# Patient Record
Sex: Male | Born: 1970
Health system: Southern US, Community
[De-identification: ages and names within clinical notes are randomized; demographics above are authoritative.]

## PROBLEM LIST (undated history)

## (undated) DIAGNOSIS — Q2112 Patent foramen ovale: Secondary | ICD-10-CM

## (undated) DIAGNOSIS — I8290 Acute embolism and thrombosis of unspecified vein: Secondary | ICD-10-CM

## (undated) DIAGNOSIS — I82409 Acute embolism and thrombosis of unspecified deep veins of unspecified lower extremity: Secondary | ICD-10-CM

## (undated) DIAGNOSIS — L539 Erythematous condition, unspecified: Secondary | ICD-10-CM

## (undated) DIAGNOSIS — R55 Syncope and collapse: Secondary | ICD-10-CM

## (undated) DIAGNOSIS — Q211 Atrial septal defect: Secondary | ICD-10-CM

## (undated) DIAGNOSIS — B000 Eczema herpeticum: Secondary | ICD-10-CM

## (undated) DIAGNOSIS — I10 Essential (primary) hypertension: Secondary | ICD-10-CM

## (undated) DIAGNOSIS — N184 Chronic kidney disease, stage 4 (severe): Secondary | ICD-10-CM

## (undated) DIAGNOSIS — I2699 Other pulmonary embolism without acute cor pulmonale: Secondary | ICD-10-CM

## (undated) DIAGNOSIS — Z973 Presence of spectacles and contact lenses: Secondary | ICD-10-CM

## (undated) DIAGNOSIS — Q613 Polycystic kidney, unspecified: Secondary | ICD-10-CM

## (undated) DIAGNOSIS — I480 Paroxysmal atrial fibrillation: Secondary | ICD-10-CM

## (undated) HISTORY — DX: Paroxysmal atrial fibrillation: I48.0

## (undated) HISTORY — PX: HERNIA REPAIR: SHX51

## (undated) HISTORY — DX: Acute embolism and thrombosis of unspecified deep veins of unspecified lower extremity: I82.409

## (undated) HISTORY — DX: Presence of spectacles and contact lenses: Z97.3

## (undated) HISTORY — DX: Syncope and collapse: R55

## (undated) HISTORY — DX: Essential (primary) hypertension: I10

## (undated) HISTORY — DX: Patent foramen ovale: Q21.12

## (undated) HISTORY — DX: Other pulmonary embolism without acute cor pulmonale: I26.99

## (undated) HISTORY — DX: Eczema herpeticum: B00.0

## (undated) HISTORY — DX: Erythematous condition, unspecified: L53.9

## (undated) HISTORY — DX: Polycystic kidney, unspecified: Q61.3

## (undated) HISTORY — DX: Chronic kidney disease, stage 4 (severe): N18.4

## (undated) HISTORY — DX: Acute embolism and thrombosis of unspecified vein: I82.90

## (undated) HISTORY — DX: Atrial septal defect: Q21.1

---
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
## 2000-05-29 ENCOUNTER — Ambulatory Visit: Admission: RE | Admit: 2000-05-29 | Discharge: 2000-05-29 | Payer: Self-pay | Admitting: Family Medicine

## 2000-07-04 ENCOUNTER — Encounter: Payer: Self-pay | Admitting: Family Medicine

## 2000-07-04 ENCOUNTER — Encounter: Admission: RE | Admit: 2000-07-04 | Discharge: 2000-07-04 | Payer: Self-pay | Admitting: Family Medicine

## 2004-11-17 ENCOUNTER — Inpatient Hospital Stay (HOSPITAL_COMMUNITY): Admission: EM | Admit: 2004-11-17 | Discharge: 2004-11-18 | Payer: Self-pay | Admitting: Emergency Medicine

## 2004-11-17 ENCOUNTER — Ambulatory Visit: Payer: Self-pay | Admitting: Internal Medicine

## 2007-07-15 ENCOUNTER — Encounter (INDEPENDENT_AMBULATORY_CARE_PROVIDER_SITE_OTHER): Payer: Self-pay | Admitting: Family Medicine

## 2010-08-06 ENCOUNTER — Emergency Department (HOSPITAL_COMMUNITY): Admission: EM | Admit: 2010-08-06 | Discharge: 2010-08-07 | Payer: Self-pay | Admitting: Emergency Medicine

## 2011-01-14 ENCOUNTER — Encounter (HOSPITAL_COMMUNITY): Payer: BC Managed Care – PPO

## 2011-01-14 ENCOUNTER — Other Ambulatory Visit: Payer: Self-pay | Admitting: General Surgery

## 2011-01-14 ENCOUNTER — Other Ambulatory Visit (HOSPITAL_COMMUNITY): Payer: Self-pay | Admitting: General Surgery

## 2011-01-14 ENCOUNTER — Ambulatory Visit (HOSPITAL_COMMUNITY)
Admission: RE | Admit: 2011-01-14 | Discharge: 2011-01-14 | Disposition: A | Discharge status: Home / Self Care | Payer: BC Managed Care – PPO | Source: Ambulatory Visit | Attending: General Surgery | Admitting: General Surgery

## 2011-01-14 DIAGNOSIS — Z01818 Encounter for other preprocedural examination: Secondary | ICD-10-CM

## 2011-01-14 DIAGNOSIS — I10 Essential (primary) hypertension: Secondary | ICD-10-CM | POA: Insufficient documentation

## 2011-01-14 DIAGNOSIS — Z01812 Encounter for preprocedural laboratory examination: Secondary | ICD-10-CM | POA: Insufficient documentation

## 2011-01-14 DIAGNOSIS — K4090 Unilateral inguinal hernia, without obstruction or gangrene, not specified as recurrent: Secondary | ICD-10-CM | POA: Insufficient documentation

## 2011-01-14 DIAGNOSIS — Z01810 Encounter for preprocedural cardiovascular examination: Secondary | ICD-10-CM | POA: Insufficient documentation

## 2011-01-14 DIAGNOSIS — F17200 Nicotine dependence, unspecified, uncomplicated: Secondary | ICD-10-CM | POA: Insufficient documentation

## 2011-01-14 LAB — CBC
HCT: 43.6 % (ref 39.0–52.0)
Hemoglobin: 14.7 g/dL (ref 13.0–17.0)
MCH: 31.6 pg (ref 26.0–34.0)
MCHC: 33.7 g/dL (ref 30.0–36.0)
MCV: 93.8 fL (ref 78.0–100.0)
Platelets: 190 10*3/uL (ref 150–400)
RBC: 4.65 MIL/uL (ref 4.22–5.81)
RDW: 12.6 % (ref 11.5–15.5)
WBC: 4.1 10*3/uL (ref 4.0–10.5)

## 2011-01-14 LAB — DIFFERENTIAL
Basophils Absolute: 0 10*3/uL (ref 0.0–0.1)
Basophils Relative: 1 % (ref 0–1)
Eosinophils Absolute: 0.3 10*3/uL (ref 0.0–0.7)
Eosinophils Relative: 8 % — ABNORMAL HIGH (ref 0–5)
Lymphocytes Relative: 31 % (ref 12–46)
Lymphs Abs: 1.3 10*3/uL (ref 0.7–4.0)
Monocytes Absolute: 0.3 10*3/uL (ref 0.1–1.0)
Monocytes Relative: 7 % (ref 3–12)
Neutro Abs: 2.2 10*3/uL (ref 1.7–7.7)
Neutrophils Relative %: 54 % (ref 43–77)

## 2011-01-14 LAB — BASIC METABOLIC PANEL
BUN: 16 mg/dL (ref 6–23)
CO2: 29 mEq/L (ref 19–32)
Calcium: 9.3 mg/dL (ref 8.4–10.5)
Chloride: 106 mEq/L (ref 96–112)
Creatinine, Ser: 1.34 mg/dL (ref 0.4–1.5)
GFR calc Af Amer: >60 mL/min (ref >60)
GFR calc non Af Amer: 60 mL/min — ABNORMAL LOW (ref >60)
Glucose, Bld: 106 mg/dL — ABNORMAL HIGH (ref 70–99)
Potassium: 3.9 mEq/L (ref 3.5–5.1)
Sodium: 141 mEq/L (ref 135–145)

## 2011-01-14 LAB — SURGICAL PCR SCREEN: Staphylococcus aureus: POSITIVE — AB

## 2011-01-16 ENCOUNTER — Ambulatory Visit (HOSPITAL_COMMUNITY)
Admission: RE | Admit: 2011-01-16 | Discharge: 2011-01-16 | Disposition: A | Discharge status: Home / Self Care | Payer: BC Managed Care – PPO | Source: Ambulatory Visit | Attending: General Surgery | Admitting: General Surgery

## 2011-01-16 DIAGNOSIS — Z79899 Other long term (current) drug therapy: Secondary | ICD-10-CM | POA: Insufficient documentation

## 2011-01-16 DIAGNOSIS — J45909 Unspecified asthma, uncomplicated: Secondary | ICD-10-CM | POA: Insufficient documentation

## 2011-01-16 DIAGNOSIS — I10 Essential (primary) hypertension: Secondary | ICD-10-CM | POA: Insufficient documentation

## 2011-01-16 DIAGNOSIS — F17200 Nicotine dependence, unspecified, uncomplicated: Secondary | ICD-10-CM | POA: Insufficient documentation

## 2011-01-16 DIAGNOSIS — K4090 Unilateral inguinal hernia, without obstruction or gangrene, not specified as recurrent: Secondary | ICD-10-CM | POA: Insufficient documentation

## 2011-01-17 NOTE — Op Note (Signed)
NAMEBRAYTON, [name]             ACCOUNT NO.:  [id number]  MEDICAL RECORD NO.:  [id number]           PATIENT TYPE:  O  LOCATION:  DAYL                         FACILITY:  [facility]  PHYSICIAN:  Dickey Gave, MDDATE OF BIRTH:  [birth year]  DATE OF PROCEDURE:  01/16/2011 DATE OF DISCHARGE:                              OPERATIVE REPORT   PREOPERATIVE DIAGNOSIS:  Right inguinal hernia  POSTOPERATIVE DIAGNOSIS:  Direct right inguinal hernia.  PROCEDURE:  Right inguinal repair with Ultrapro hernia system.  SURGEON:  Dickey Gave, MD  ASSISTANT:  None.  ANESTHESIA:  General.  SUPERVISING ANESTHESIOLOGIST:  Cleon Dew. Kalman Shan, M.D.  SPECIMENS:  None.  DRAINS:  None.  ESTIMATED BLOOD LOSS:  Minimal.  COMPLICATIONS:  None.  DISPOSITION:  To recovery room in stable condition.  INDICATIONS:  This is a 14-year male who has a right groin bulge that is asymptomatic except for a bulge that he notes.  We discussed observation versus repair.  He wanted to have this area repaired.  On his exam, it was reducible.  It was somewhat tender when I reduced it.  So we discussed an open right inguinal repair with mesh.  DESCRIPTION OF PROCEDURE:  After informed consent was obtained, the patient was taken to the operating room, he was administered 1 g of intravenous cefazolin.  Sequential compression devices were placed on lower extremities prior to induction of anesthesia.  He was then placed under general anesthesia with an LMA without complication.  His right groin was then prepped and draped in standard sterile surgical fashion. A surgical time-out was performed.  I infiltrated 10 cc of 0.25% Marcaine throughout his right groin as well as performing an ilioinguinal nerve block.  I then made a 4-cm incision in his right groin, carried this down to the level of the external abdominal oblique.  This was incised.  His spermatic cord was encircled with a Penrose drain.  He did not  have an indirect hernia.  He had a fairly large direct hernia present.  His cord structures were separated free from the direct hernia.  The direct hernia sac was entered and the preperitoneal space was developed.  A sponge was placed in this.  I again examined the cord and there was no evidence of an indirect hernia at all.  I then placed a large Ultrapro hernia system with the bottom portion of bilayer and the preperitoneal space.  This was placed over Cooper's ligament and laid flat.  I then closed the floor with the 2-0 Vicryl.  The top portion was then laid flat.  This was sutured into several positions with 2-0 Prolene to the pubic tubercle and to the inguinal ligament inferiorly.  A T cut was made.  This was wrapped around the spermatic cord.  This was sutured together as well as to the internal oblique superiorly as well.  The ends were laid flat under the external oblique.  Hemostasis was observed.  The external oblique was closed with 2-0 Vicryl, Scarpa's with 3-0 Vicryl, the skin with 4-0 Monocryl in subcuticular fashion.  Dermabond was placed over the wound. An additional 10 cc of  0.25% Marcaine were filtrated throughout the wound again.  He tolerated this well and transferred to the recovery room in stable condition.     Dickey Gave, MD     MCW/MEDQ  D:  01/16/2011  T:  01/16/2011  Job:  [id number]  Electronically Signed by Rolm Bookbinder MD on 01/17/2011 07:18:29 AM

## 2011-02-20 ENCOUNTER — Encounter (INDEPENDENT_AMBULATORY_CARE_PROVIDER_SITE_OTHER): Payer: Self-pay | Admitting: Surgery

## 2011-03-08 NOTE — H&P (Signed)
[name], [name]             ACCOUNT NO.:  [id number]   MEDICAL RECORD NO.:  [id number]          PATIENT TYPE:  INP   LOCATION:  2103                         FACILITY:  [facility]   PHYSICIAN:  Sherryl Manges, M.D.  DATE OF BIRTH:  Oct 17, 1971   DATE OF ADMISSION:  11/17/2004  DATE OF DISCHARGE:                                HISTORY & PHYSICAL   PRIMARY CARE PHYSICIAN:  Dr. Juventino Slovak   CHIEF COMPLAINT:  Shortness of breath.   HISTORY OF PRESENT ILLNESS:  This is a 40 year old male, known asthmatic,  apparently became initially short of breath on Christmas Eve, i.e., October 13, 2004 and since then has been intermittently wheezing and short of  breath, becoming progressively worse since. He has had a cough productive of  clear phlegm over the past 1-week. However, on November 16, 2004 his  shortness of breath and wheezing became so bad that he went to the [facility] where he had nebulizer treatment x2 followed by IV steroids and  was sent to the emergency department at [facility] via EMS.   PAST MEDICAL HISTORY:  1.  Bronchial asthma.  2.  Bilateral inguinal hernias.  3.  Smoker.   MEDICATIONS:  1.  Proventil MDI p.r.n.  2.  Advair Diskus b.i.d.   ALLERGIES:  No known drug allergies.   SOCIAL/FAMILY HISTORY:  Married. Smokes one pack of cigarettes per day for  since age [age]years. Drinks alcohol occasionally.  No history of drug  abuse.  The patient has a strong family history of bronchial asthma.   PHYSICAL EXAMINATION:  GENERAL:  In obvious respiratory distress.  VITAL SIGNS: Temperature 97.0, pulse 90 per minute, regular. Blood pressure  210/110 mmHg.  Pulse oximetry 85% on room air. The patient is unable to talk  in complete sentences.  Accessory respiratory muscles are in play.  HEENT:  No pallor or jaundice.  NECK:  Supple, JVP not seen. No palpable goiter.  CHEST:  Bilateral expiratory polyphonic wheeze, no crackles.  HEART:  S1, S2 heard, normal,  regular. No murmurs.  ABDOMEN:  Unable to examine as patient is unable to lie flat.  LOWER EXTREMITIES:  Showed no edema.   LABORATORY DATA:  CBC, BMP are pending. Chest x-ray reveals no focal  infiltrate.   ASSESSMENT/AND PLAN:  1.  Acute severe bronchial asthma/Status asthmaticus. Will continue      nebulizers, oxygen and intravenous steroids. Will admit to the intensive      care unit. ABG done on oxygen shows a pH of 7.8, PCO2 69.3, PO2 of 55      mmHg, bicarbonate 27.6 on 100% O2, features of respiratory failure. Will      maintain low threshold for intubation and artificial ventilation.   1.  Uncontrolled hypertension in face of severe respiratory distress.      Patient has no prior history of hypertension. Will reassess when the      patient has settled down. Meanwhile, will utilize ACE inhibitors as      needed.  Further management will depend on clinical course.   Addendum: Dr Melvyn Novas ,  Pulmonology/Critical Care, was consulted, re possible  initiation of artificial ventilation. Initial recommendations, were to  monitor patient very closely, as patient appeared to be gradually responding  to intensive conservative measures. Bipap to be utilized initially, should  clinical conditon deteriorate.      CO/MEDQ  D:  11/17/2004  T:  11/17/2004  Job:  [id number]   cc:   Vickki Hearing, M.D.  [address] [place], [place] [address]  Fax: [phone]

## 2012-12-22 ENCOUNTER — Encounter (HOSPITAL_COMMUNITY): Payer: Self-pay | Admitting: Emergency Medicine

## 2012-12-22 ENCOUNTER — Emergency Department (HOSPITAL_COMMUNITY)
Admission: EM | Admit: 2012-12-22 | Discharge: 2012-12-22 | Disposition: A | Discharge status: Home / Self Care | Payer: BC Managed Care – PPO | Source: Home / Self Care | Attending: Family Medicine | Admitting: Family Medicine

## 2012-12-22 DIAGNOSIS — T7840XA Allergy, unspecified, initial encounter: Secondary | ICD-10-CM

## 2012-12-22 LAB — POCT RAPID STREP A: Streptococcus, Group A Screen (Direct): NEGATIVE

## 2012-12-22 MED ORDER — HYDROXYZINE HCL 25 MG PO TABS: 25.0000 mg | ORAL_TABLET | Freq: Four times a day (QID) | ORAL | Status: DC

## 2012-12-22 MED ORDER — ALBUTEROL SULFATE HFA 108 (90 BASE) MCG/ACT IN AERS: 1.0000 | INHALATION_SPRAY | Freq: Four times a day (QID) | RESPIRATORY_TRACT | Status: DC | PRN

## 2012-12-22 MED ORDER — METHYLPREDNISOLONE SODIUM SUCC 125 MG IJ SOLR
INTRAMUSCULAR | Status: AC
  Filled 2012-12-22: qty 2

## 2012-12-22 MED ORDER — METHYLPREDNISOLONE SODIUM SUCC 125 MG IJ SOLR
125.0000 mg | Freq: Once | INTRAMUSCULAR | Status: AC
  Administered 2012-12-22: 125 mg via INTRAMUSCULAR

## 2012-12-22 MED ORDER — METHYLPREDNISOLONE 4 MG PO KIT: PACK | ORAL | Status: DC

## 2012-12-22 NOTE — ED Provider Notes (Signed)
History     CSN: [id number]  Arrival date & time 12/22/12  1604   First MD Initiated Contact with Patient 12/22/12 1608      Chief Complaint  Patient presents with  . Sore Throat    since yesterday. hard to swallow    (Consider location/radiation/quality/duration/timing/severity/associated sxs/prior treatment) Patient is a 42 y.o. male presenting with pharyngitis. The history is provided by the patient.  Sore Throat This is a new problem. The current episode started yesterday (similar episode approx 3 yrs ago). The problem occurs constantly. The problem has not changed since onset.The symptoms are aggravated by swallowing.    Past Medical History  Diagnosis Date  . Hypertension   . Asthma   . Chronic kidney disease   . Wears glasses     Past Surgical History  Procedure Laterality Date  . Eye surgery  1976    Family History  Problem Relation Age of Onset  . Asthma Father   . Kidney disease Father     History  Substance Use Topics  . Smoking status: Current Every Day Smoker -- 1.00 packs/day  . Smokeless tobacco: Not on file  . Alcohol Use: Yes     Comment: OCCASIONALLY      Review of Systems  Constitutional: Negative.   HENT: Positive for sore throat and trouble swallowing. Negative for congestion, rhinorrhea, drooling and postnasal drip.   Respiratory: Positive for wheezing.   Cardiovascular: Negative.   Gastrointestinal: Negative.     Allergies  Review of patient's allergies indicates no known allergies.  Home Medications   Current Outpatient Rx  Name  Route  Sig  Dispense  Refill  . albuterol (PROVENTIL HFA;VENTOLIN HFA) 108 (90 BASE) MCG/ACT inhaler   Inhalation   Inhale 1-2 puffs into the lungs every 6 (six) hours as needed for wheezing.   1 Inhaler   1   . Albuterol Sulfate (PROAIR HFA IN)   Inhalation   Inhale into the lungs daily.           . Aliskiren-Hydrochlorothiazide (TEKTURNA HCT PO)   Oral   Take 12.5 mg by mouth daily.            . hydrOXYzine (ATARAX/VISTARIL) 25 MG tablet   Oral   Take 1 tablet (25 mg total) by mouth every 6 (six) hours.   20 tablet   0   . methylPREDNISolone (MEDROL DOSEPAK) 4 MG tablet      follow package directions, start on wed, take until finished.   21 tablet   0     There were no vitals taken for this visit.  Physical Exam  Nursing note and vitals reviewed. Constitutional: He is oriented to person, place, and time. He appears well-developed and well-nourished. No distress.  HENT:  Head: Normocephalic.  Right Ear: External ear normal.  Left Ear: External ear normal.  Nose: Nose normal.  Mouth/Throat: Uvula is midline, oropharynx is clear and moist and mucous membranes are normal. Edematous present. No oropharyngeal exudate, posterior oropharyngeal edema, posterior oropharyngeal erythema or tonsillar abscesses.  Eyes: Conjunctivae are normal. Pupils are equal, round, and reactive to light.  Neck: Normal range of motion. Neck supple.  Pulmonary/Chest: No respiratory distress. He has wheezes. He has no rales.  Lymphadenopathy:    He has no cervical adenopathy.  Neurological: He is alert and oriented to person, place, and time.  Skin: Skin is warm and dry.    ED Course  Procedures (including critical care time)  Labs Reviewed  POCT RAPID STREP A ([facility]RG CARE ONLY)   No results found.   1. Allergic reaction, initial encounter       MDM          Billy Fischer, MD 12/22/12 1744

## 2012-12-22 NOTE — ED Notes (Signed)
Pt c/o sore throat and very hard to swallow, with swelling.  Pt has not taken any otc  Meds. Pt denies any other symptoms.

## 2012-12-25 ENCOUNTER — Encounter (HOSPITAL_COMMUNITY): Payer: Self-pay | Admitting: Nurse Practitioner

## 2012-12-25 ENCOUNTER — Emergency Department (HOSPITAL_COMMUNITY)
Admission: EM | Admit: 2012-12-25 | Discharge: 2012-12-25 | Disposition: A | Discharge status: Home / Self Care | Payer: BC Managed Care – PPO | Attending: Emergency Medicine | Admitting: Emergency Medicine

## 2012-12-25 ENCOUNTER — Encounter (HOSPITAL_COMMUNITY): Payer: Self-pay | Admitting: Emergency Medicine

## 2012-12-25 ENCOUNTER — Emergency Department (HOSPITAL_COMMUNITY): Payer: BC Managed Care – PPO

## 2012-12-25 ENCOUNTER — Emergency Department (INDEPENDENT_AMBULATORY_CARE_PROVIDER_SITE_OTHER)
Admission: EM | Admit: 2012-12-25 | Discharge: 2012-12-25 | Disposition: A | Discharge status: Other Acute Inpatient Hospital | Payer: BC Managed Care – PPO | Source: Home / Self Care

## 2012-12-25 DIAGNOSIS — J36 Peritonsillar abscess: Secondary | ICD-10-CM

## 2012-12-25 DIAGNOSIS — N189 Chronic kidney disease, unspecified: Secondary | ICD-10-CM | POA: Insufficient documentation

## 2012-12-25 DIAGNOSIS — R599 Enlarged lymph nodes, unspecified: Secondary | ICD-10-CM | POA: Insufficient documentation

## 2012-12-25 DIAGNOSIS — I129 Hypertensive chronic kidney disease with stage 1 through stage 4 chronic kidney disease, or unspecified chronic kidney disease: Secondary | ICD-10-CM | POA: Insufficient documentation

## 2012-12-25 DIAGNOSIS — F17200 Nicotine dependence, unspecified, uncomplicated: Secondary | ICD-10-CM | POA: Insufficient documentation

## 2012-12-25 DIAGNOSIS — J45909 Unspecified asthma, uncomplicated: Secondary | ICD-10-CM | POA: Insufficient documentation

## 2012-12-25 DIAGNOSIS — Z79899 Other long term (current) drug therapy: Secondary | ICD-10-CM | POA: Insufficient documentation

## 2012-12-25 DIAGNOSIS — R1310 Dysphagia, unspecified: Secondary | ICD-10-CM | POA: Insufficient documentation

## 2012-12-25 LAB — POCT I-STAT, CHEM 8
BUN: 25 mg/dL — ABNORMAL HIGH (ref 6–23)
Calcium, Ion: 1.21 mmol/L (ref 1.12–1.23)
Chloride: 104 mEq/L (ref 96–112)
Creatinine, Ser: 1.4 mg/dL — ABNORMAL HIGH (ref 0.50–1.35)
Glucose, Bld: 97 mg/dL (ref 70–99)
Potassium: 3.6 mEq/L (ref 3.5–5.1)

## 2012-12-25 MED ORDER — HYDROCODONE-ACETAMINOPHEN 7.5-500 MG/15ML PO SOLN: ORAL | Status: DC

## 2012-12-25 MED ORDER — IOHEXOL 300 MG/ML  SOLN
75.0000 mL | Freq: Once | INTRAMUSCULAR | Status: AC | PRN
  Administered 2012-12-25: 75 mL via INTRAVENOUS

## 2012-12-25 MED ORDER — SODIUM CHLORIDE 0.9 % IV BOLUS (SEPSIS)
1000.0000 mL | Freq: Once | INTRAVENOUS | Status: AC
  Administered 2012-12-25: 1000 mL via INTRAVENOUS

## 2012-12-25 MED ORDER — DEXAMETHASONE SODIUM PHOSPHATE 10 MG/ML IJ SOLN
10.0000 mg | Freq: Once | INTRAMUSCULAR | Status: AC
  Administered 2012-12-25: 10 mg via INTRAVENOUS
  Filled 2012-12-25: qty 1

## 2012-12-25 MED ORDER — OXYCODONE HCL 5 MG/5ML PO SOLN
5.0000 mg | ORAL | Status: AC
  Administered 2012-12-25: 5 mg via ORAL
  Filled 2012-12-25: qty 5

## 2012-12-25 MED ORDER — HYDROCODONE-ACETAMINOPHEN 5-325 MG PO TABS: 2.0000 | ORAL_TABLET | ORAL | Status: DC | PRN

## 2012-12-25 MED ORDER — HYDROMORPHONE HCL PF 1 MG/ML IJ SOLN
1.0000 mg | Freq: Once | INTRAMUSCULAR | Status: AC
  Administered 2012-12-25: 1 mg via INTRAVENOUS
  Filled 2012-12-25: qty 1

## 2012-12-25 MED ORDER — CLINDAMYCIN HCL 300 MG PO CAPS: 300.0000 mg | ORAL_CAPSULE | Freq: Three times a day (TID) | ORAL | Status: DC

## 2012-12-25 MED ORDER — CLINDAMYCIN PHOSPHATE 600 MG/50ML IV SOLN
600.0000 mg | Freq: Once | INTRAVENOUS | Status: AC
  Administered 2012-12-25: 600 mg via INTRAVENOUS
  Filled 2012-12-25: qty 50

## 2012-12-25 NOTE — ED Notes (Signed)
Pt c/o dysphagia since Monday Denies: f/v/n/d Seen on 12/22/12 here at the [facility] for the same reason; given methylprednisolone w/little relief He is alert and oriented w/no signs of acute distress.

## 2012-12-25 NOTE — ED Provider Notes (Signed)
Chief Complaint  Patient presents with  . Dysphagia    History of Present Illness:   [name] is a 42 year old male who has had a one-week history of a severe sore throat and pain on swallowing. He was seen here Monday for the same thing and a rapid strep screen was negative. He was given a Medrol Dosepak, but feels no better. The pain is mostly on the right side in his glands are swollen on the right. He denies any fever or chills. He has had a cough productive of some clear sputum. He takes Provera and lisinopril. He has asthma and high blood pressure. He has no known medication allergies.  Review of Systems:  Other than as noted above, the patient denies any of the following symptoms. Systemic:  No fever, chills, sweats, fatigue, myalgias, headache, or anorexia. Eye:  No redness, pain or drainage. ENT:  No earache, ear congestion, nasal congestion, sneezing, rhinorrhea, sinus pressure, sinus pain, or post nasal drip. Lungs:  No cough, sputum production, wheezing, shortness of breath, or chest pain. GI:  No abdominal pain, nausea, vomiting, or diarrhea. Skin:  No rash or itching.  [name]:  Past medical history, family history, social history, meds, allergies, and nurse's notes were reviewed.  There is no known exposure to strep or mono.  No prior history of step or mono.  The patient denies use of tobacco.  Physical Exam:   Vital signs:  BP 143/94  Pulse 61  Temp(Src) 98.5 F (36.9 C) (Oral)  Resp 14  SpO2 99% General:  Alert, in no distress. Eye:  No conjunctival injection or drainage. Lids were normal. ENT:  TMs and canals were normal, without erythema or inflammation.  Nasal mucosa was clear and uncongested, without drainage.  Mucous membranes were moist.  Exam of pharynx reveals marked erythema and swelling on the right side with the tonsillar pillar being pushed forward and touching the uvula. There was also slight swelling on the left side as well and the uvula was slightly  swollen in addition. There were no other intraoral lesions.  There were no oral ulcerations or lesions. Neck:  Supple, no adenopathy, tenderness or mass. Lungs:  No respiratory distress.  Lungs were clear to auscultation, without wheezes, rales or rhonchi.  Breath sounds were clear and equal bilaterally.  Heart:  Regular rhythm, without gallops, murmers or rubs. Skin:  Clear, warm, and dry, without rash or lesions.  Assessment:  The encounter diagnosis was Tonsillar abscess.  His situation seems to be worsening since he was here Monday and on physical exam her throat has the appearance of the peritonsillar abscess.  Plan:   1.  The following meds were prescribed:   New Prescriptions   No medications on file   2.  The patient was transferred to the emergency department via shuttle in stable condition.   Harden Mo, MD 12/25/12 [phone]

## 2012-12-25 NOTE — ED Provider Notes (Signed)
History    This chart was scribed for non-physician practitioner working with Ezequiel Essex, MD by Joline Maxcy, ED Scribe. This patient was seen in room TR11C/TR11C and the patient's care was started at 1558.   CSN: [id number]  Arrival date & time 12/25/12  1558   First MD Initiated Contact with Patient 12/25/12 1610      Chief Complaint  Patient presents with  . Sore Throat    (Consider location/radiation/quality/duration/timing/severity/associated sxs/prior treatment) The history is provided by the patient. No language interpreter was used.    [name] is a 42 y.o. male who presents to the Emergency Department complaining of a gradual onset, constant, non-radiating, gradually worsening sore throat that is worse on the right side with difficulty swallowing that began 5 days ago. He was seen by UC 4 days ago and given a Medrol Dosepak after having a negative rapid strep screen, but denies any relief. He denies any associated headache, fever, chills, otalgia, chest pain, nausea, or emesis. He has a h/o of asthma, but denies any recent steroid use. He denies any recent sick contacts.   Past Medical History  Diagnosis Date  . Hypertension   . Asthma   . Chronic kidney disease   . Wears glasses     Past Surgical History  Procedure Laterality Date  . Eye surgery  1976    Family History  Problem Relation Age of Onset  . Asthma Father   . Kidney disease Father     History  Substance Use Topics  . Smoking status: Current Every Day Smoker -- 1.00 packs/day  . Smokeless tobacco: Not on file  . Alcohol Use: Yes     Comment: OCCASIONALLY      Review of Systems  Constitutional: Negative for fever, diaphoresis, appetite change, fatigue and unexpected weight change.  HENT: Positive for sore throat and trouble swallowing. Negative for mouth sores and neck stiffness.   Eyes: Negative for visual disturbance.  Respiratory: Negative for cough, chest tightness and wheezing.    Gastrointestinal: Negative for nausea, vomiting, diarrhea and constipation.  Endocrine: Negative for polydipsia, polyphagia and polyuria.  Genitourinary: Negative for dysuria, urgency, frequency and hematuria.  Musculoskeletal: Negative for back pain.  Skin: Negative for rash.  Allergic/Immunologic: Negative for immunocompromised state.  Neurological: Negative for syncope and light-headedness.  Hematological: Does not bruise/bleed easily.  Psychiatric/Behavioral: Negative for sleep disturbance. The patient is not nervous/anxious.   All other systems reviewed and are negative.    Allergies  Review of patient's allergies indicates no known allergies.  Home Medications   Current Outpatient Rx  Name  Route  Sig  Dispense  Refill  . albuterol (PROVENTIL HFA;VENTOLIN HFA) 108 (90 BASE) MCG/ACT inhaler   Inhalation   Inhale 1-2 puffs into the lungs every 6 (six) hours as needed for wheezing or shortness of breath.         . lisinopril (PRINIVIL,ZESTRIL) 20 MG tablet   Oral   Take 20 mg by mouth daily.         . methylPREDNISolone (MEDROL DOSEPAK) 4 MG tablet   Oral   Take 4-24 mg by mouth daily. 6 tabs on day 1, 5 tabs on day 2, 4 tabs on day 3, 3 tabs on day 2, 2 tabs on day 1, then 1 tab then stop; Start date 12/25/12         . clindamycin (CLEOCIN) 300 MG capsule   Oral   Take 1 capsule (300 mg total) by mouth 3 (three)  times daily. May open cap and take with liquid   30 capsule   0   . HYDROcodone-acetaminophen (LORTAB) 7.5-500 MG/15ML solution      2 - 3 tsp po q 4 - 6 hrs   300 mL   0   . HYDROcodone-acetaminophen (NORCO/VICODIN) 5-325 MG per tablet   Oral   Take 2 tablets by mouth every 4 (four) hours as needed for pain.   10 tablet   0     BP 121/81  Pulse 64  Temp(Src) 97.6 F (36.4 C) (Oral)  Resp 20  SpO2 97%  Physical Exam  Nursing note and vitals reviewed. Constitutional: He is oriented to person, place, and time. He appears well-developed and  well-nourished. No distress.  HENT:  Head: Normocephalic and atraumatic.  Right Ear: External ear normal. Tympanic membrane is erythematous. Tympanic membrane is not bulging.  Left Ear: Tympanic membrane and external ear normal. Tympanic membrane is not erythematous and not bulging.  Mouth/Throat: Tonsillar abscesses present. No oropharyngeal exudate.  Erythema and swelling of the right tonsil. Uvula is deviated. Hot potato voice   Eyes: Conjunctivae are normal. Pupils are equal, round, and reactive to light. No scleral icterus.  Neck: Normal range of motion. Neck supple.  Cardiovascular: Normal rate, regular rhythm and intact distal pulses.   No murmur heard. Pulmonary/Chest: Effort normal. No respiratory distress. He has decreased breath sounds in the right upper field, the right middle field, the right lower field, the left upper field, the left middle field and the left lower field. He has no wheezes.  Abdominal: Soft. Bowel sounds are normal. He exhibits no mass. There is no tenderness. There is no rebound and no guarding.  Musculoskeletal: Normal range of motion. He exhibits no edema and no tenderness.  Lymphadenopathy:       Head (right side): Tonsillar adenopathy present. No submandibular and no preauricular adenopathy present.       Head (left side): No submandibular, no tonsillar and no preauricular adenopathy present.  Neurological: He is alert and oriented to person, place, and time. No cranial nerve deficit. He exhibits normal muscle tone. Coordination normal.  Speech is clear and goal oriented Moves extremities without ataxia  Skin: Skin is warm and dry. No rash noted. He is not diaphoretic. No erythema.  Psychiatric: He has a normal mood and affect.    ED Course  Procedures (including critical care time)  DIAGNOSTIC STUDIES: Oxygen Saturation is 99% on room air, normal by my interpretation.    COORDINATION OF CARE:  16:28- Discussed planned course of treatment with the  patient, including an ENT consult, Roxicodone, and neck CT without contrast, who is agreeable at this time.  17:00- Medication Orders- oxycodone (roxicodone) 5mg /3ml solution 5 mg.  18:13- Medication Orders- iohexol (omnipaque) 300 mg/ml solution 75 ml- once.  18:45- Medication Orders- Hydromorphone (dilaudid) injection 1 mg- once.  19:30- Medication Orders- dexamethasone (decadron) injection 10 mg- once, sodium chloride 0.9% bolus 1,000 mL- once, clindamycin (cleocin) IVPB 600 mg- once.  Results for orders placed during the hospital encounter of 12/25/12  POCT I-STAT, CHEM 8      Result Value Range   Sodium 140  135 - 145 mEq/L   Potassium 3.6  3.5 - 5.1 mEq/L   Chloride 104  96 - 112 mEq/L   BUN 25 (*) 6 - 23 mg/dL   Creatinine, Ser 1.40 (*) 0.50 - 1.35 mg/dL   Glucose, Bld 97  70 - 99 mg/dL   Calcium, Ion  1.21  1.12 - 1.23 mmol/L   TCO2 32  0 - 100 mmol/L   Hemoglobin 14.6  13.0 - 17.0 g/dL   HCT 43.0  39.0 - 52.0 %     Labs Reviewed  POCT I-STAT, CHEM 8 - Abnormal; Notable for the following:    BUN 25 (*)    Creatinine, Ser 1.40 (*)    All other components within normal limits   Ct Soft Tissue Neck W Contrast  12/25/2012  *RADIOLOGY REPORT*  Clinical Data: Sore throat, worse on the right, difficulty swallowing  CT NECK WITH CONTRAST  Technique:  Multidetector CT imaging of the neck was performed with intravenous contrast.  Contrast: 70mL OMNIPAQUE IOHEXOL 300 MG/ML  SOLN  Comparison: None.  Findings: The airway remains patent.  1.6 x 2.3 x 2.5 cm right peritonsillar abscess.  Small mildly prominent bilateral anterior cervical lymph nodes, reactive.  Thyroid is within normal limits.  Cervical spine is within normal limits.  The visualized paranasal sinuses are essentially clear. The mastoid air cells are unopacified.  Visualized brain parenchyma is unremarkable.  Mild paraseptal emphysematous changes of the right lung apex.  IMPRESSION: 2.5 cm right peritonsillar abscess.  The  airway remains patent.   Original Report Authenticated By: Julian Hy, M.D.      1. Tonsillar abscess       MDM  [name] presents with peritonsillar abscess. Patient handling secretions without difficulty and patent airway. CT neck confirms abscess measuring it at 2.5cm.  Discussed with Dr Wilburn Cornelia who will I&D.  Pt given fluid bolus, Clinda 600mg  IV and dilaudid for pain control.  I&D performed without difficulty or complication.  Patient to be discharged home with clindamycin, pain medication and followup in 2 weeks with Dr. Wilburn Cornelia.  1. Medications: clindamycin, vicodin, usual home medications  2. Treatment: rest, drink plenty of fluids, take medication as needed  3. Follow Up: Please followup with your primary doctor for discussion of your diagnoses and further evaluation after today's visit; follow-up with Dr Wilburn Cornelia for further evaluation  I personally performed the services described in this documentation, which was scribed in my presence. The recorded information has been reviewed and is accurate.   Jarrett Soho Adreana Coull, PA-C 12/25/12 2055

## 2012-12-25 NOTE — ED Provider Notes (Signed)
Medical screening examination/treatment/procedure(s) were performed by non-physician practitioner and as supervising physician I was immediately available for consultation/collaboration.   Ezequiel Essex, MD 12/25/12 2208

## 2012-12-25 NOTE — Consult Note (Signed)
ENT CONSULT:  Reason for Consult:Sore Throat and PTA Referring Physician: EDP  [name] is an 42 y.o. male.  HPI: 1 wk hx of progressive ST, LG fever and otalgia. Hx of recurrent ST  Past Medical History  Diagnosis Date  . Hypertension   . Asthma   . Chronic kidney disease   . Wears glasses     Past Surgical History  Procedure Laterality Date  . Eye surgery  1976    Family History  Problem Relation Age of Onset  . Asthma Father   . Kidney disease Father     Social History:  reports that he has been smoking.  He does not have any smokeless tobacco history on file. He reports that  drinks alcohol. He reports that he does not use illicit drugs.  Allergies: No Known Allergies  Medications: I have reviewed the patient's current medications.  Results for orders placed during the hospital encounter of 12/25/12 (from the past 48 hour(s))  POCT I-STAT, CHEM 8     Status: Abnormal   Collection Time    12/25/12  5:35 PM      Result Value Range   Sodium 140  135 - 145 mEq/L   Potassium 3.6  3.5 - 5.1 mEq/L   Chloride 104  96 - 112 mEq/L   BUN 25 (*) 6 - 23 mg/dL   Creatinine, Ser 1.40 (*) 0.50 - 1.35 mg/dL   Glucose, Bld 97  70 - 99 mg/dL   Calcium, Ion 1.21  1.12 - 1.23 mmol/L   TCO2 32  0 - 100 mmol/L   Hemoglobin 14.6  13.0 - 17.0 g/dL   HCT 43.0  39.0 - 52.0 %    Ct Soft Tissue Neck W Contrast  12/25/2012  *RADIOLOGY REPORT*  Clinical Data: Sore throat, worse on the right, difficulty swallowing  CT NECK WITH CONTRAST  Technique:  Multidetector CT imaging of the neck was performed with intravenous contrast.  Contrast: 2mL OMNIPAQUE IOHEXOL 300 MG/ML  SOLN  Comparison: None.  Findings: The airway remains patent.  1.6 x 2.3 x 2.5 cm right peritonsillar abscess.  Small mildly prominent bilateral anterior cervical lymph nodes, reactive.  Thyroid is within normal limits.  Cervical spine is within normal limits.  The visualized paranasal sinuses are essentially clear. The  mastoid air cells are unopacified.  Visualized brain parenchyma is unremarkable.  Mild paraseptal emphysematous changes of the right lung apex.  IMPRESSION: 2.5 cm right peritonsillar abscess.  The airway remains patent.   Original Report Authenticated By: Julian Hy, M.D.     ROS:per adm    Blood pressure 121/81, pulse 64, temperature 97.6 F (36.4 C), temperature source Oral, resp. rate 20, SpO2 97.00%.  PHYSICAL EXAM: General appearance - alert, well appearing, and in no distress Nose - normal and patent, no erythema, discharge or polyps Mouth - mucous membranes moist, pharynx normal without lesions and Acute tonsillitis and Rt PTA Neck - bilateral symmetric anterior adenopathy  Studies Reviewed:CT scan of Neck - c/w Rt PTA and acute tonsillitis  I&D of Rt PTA: Topical and local anesth #15 I&D for mod purulent d/c    Assessment/Plan: Plan OP tx with cleocin 300 tid for 10 d, Lortab elixir, motrin and increased fluids  SHOEMAKER, DAVID 12/25/2012, 8:25 PM

## 2012-12-25 NOTE — ED Notes (Signed)
Pt transferred from Urgent Care for further evaluation of a sore throat since Monday.  Strep was negative at urgent care.  Redness noted to back of throat, airway intact.  Pt able to speak appropriately.

## 2014-08-03 ENCOUNTER — Encounter (HOSPITAL_COMMUNITY): Payer: Self-pay | Admitting: Emergency Medicine

## 2014-08-03 ENCOUNTER — Emergency Department (HOSPITAL_COMMUNITY)
Admission: EM | Admit: 2014-08-03 | Discharge: 2014-08-04 | Disposition: A | Discharge status: Home / Self Care | Payer: Self-pay | Attending: Emergency Medicine | Admitting: Emergency Medicine

## 2014-08-03 DIAGNOSIS — Z79899 Other long term (current) drug therapy: Secondary | ICD-10-CM | POA: Insufficient documentation

## 2014-08-03 DIAGNOSIS — J45909 Unspecified asthma, uncomplicated: Secondary | ICD-10-CM | POA: Insufficient documentation

## 2014-08-03 DIAGNOSIS — I129 Hypertensive chronic kidney disease with stage 1 through stage 4 chronic kidney disease, or unspecified chronic kidney disease: Secondary | ICD-10-CM | POA: Insufficient documentation

## 2014-08-03 DIAGNOSIS — N189 Chronic kidney disease, unspecified: Secondary | ICD-10-CM | POA: Insufficient documentation

## 2014-08-03 DIAGNOSIS — T783XXA Angioneurotic edema, initial encounter: Secondary | ICD-10-CM | POA: Insufficient documentation

## 2014-08-03 DIAGNOSIS — Z720 Tobacco use: Secondary | ICD-10-CM | POA: Insufficient documentation

## 2014-08-03 MED ORDER — PREDNISONE 20 MG PO TABS
60.0000 mg | ORAL_TABLET | Freq: Once | ORAL | Status: AC
  Administered 2014-08-03: 60 mg via ORAL
  Filled 2014-08-03: qty 3

## 2014-08-03 MED ORDER — FAMOTIDINE 20 MG PO TABS: 20.0000 mg | ORAL_TABLET | Freq: Two times a day (BID) | ORAL | Status: DC

## 2014-08-03 MED ORDER — PREDNISONE 10 MG PO TABS: 40.0000 mg | ORAL_TABLET | Freq: Every day | ORAL | Status: DC

## 2014-08-03 MED ORDER — DIPHENHYDRAMINE HCL 25 MG PO TABS: 25.0000 mg | ORAL_TABLET | Freq: Four times a day (QID) | ORAL | Status: DC

## 2014-08-03 MED ORDER — FAMOTIDINE 20 MG PO TABS
20.0000 mg | ORAL_TABLET | Freq: Once | ORAL | Status: AC
  Administered 2014-08-03: 20 mg via ORAL
  Filled 2014-08-03: qty 1

## 2014-08-03 MED ORDER — DIPHENHYDRAMINE HCL 25 MG PO CAPS
25.0000 mg | ORAL_CAPSULE | Freq: Once | ORAL | Status: AC
  Administered 2014-08-03: 25 mg via ORAL
  Filled 2014-08-03: qty 1

## 2014-08-03 NOTE — ED Notes (Signed)
Pt. reports lower lip swelling onset 1 am this afternoon , denies pain / no itching , respirations unlabored/ airway intact .

## 2014-08-03 NOTE — ED Notes (Signed)
Pt states that he takes lisinopril, last dose this morning.

## 2014-08-03 NOTE — Discharge Instructions (Signed)
Angioedema Angioedema is sudden puffiness (swelling), often of the skin. It can happen:  On your face or privates (genitals).  In your belly (abdomen) or other body parts. It usually happens quickly and gets better in 1 or 2 days. It often starts at night and is found when you wake up. You may get red, itchy patches of skin (hives). Attacks can be dangerous if your breathing passages get puffy. The condition may happen only once, or it can come back at random times. It may happen for several years before it goes away for good. HOME CARE  Only take medicines as told by your doctor.  Always carry your emergency allergy medicines with you.  Wear a medical bracelet as told by your doctor.  Avoid things that you know will cause attacks (triggers). GET HELP IF:  You have another attack.  Your attacks happen more often or get worse.  The condition was passed to you by your parents and you want to have children. GET HELP RIGHT AWAY IF:   Your mouth, tongue, or lips are very puffy.  You have trouble breathing.  You have trouble swallowing.  You pass out (faint). MAKE SURE YOU:   Understand these instructions.  Will watch your condition.  Will get help right away if you are not doing well or get worse. Document Released: 09/25/2009 Document Revised: 07/28/2013 Document Reviewed: 05/31/2013 [facility] Patient Information 2015 [place], [place]. This information is not intended to replace advice given to you by your health care provider. Make sure you discuss any questions you have with your health care provider.  Take the medicine is provided as directed. Return for any tongue swelling or trouble breathing. Followup with your regular doctors at the [facility] to see how they want to adjust your hypertensive medicines. As we discussed most likely related to the lisinopril but could be a straight up and regular allergic reaction.

## 2014-08-03 NOTE — ED Provider Notes (Signed)
CSN: [id number]     Arrival date & time 08/03/14  2108 History   First MD Initiated Contact with Patient 08/03/14 2309     Chief Complaint  Patient presents with  . Angioedema     (Consider location/radiation/quality/duration/timing/severity/associated sxs/prior Treatment) The history is provided by the patient.   patient is a 43 year old male long-term treatment for hypertension with lisinopril. Patient followed by tear. Today at around 10:00 lower lip swelled. Patient states is now improving. Never had any upper lip swelling never had any trouble breathing never any tongue swelling. Patient never had a reaction like this before. Patient does not know of any possible exposure or consumption of something that could cause an allergic reaction. Other than possibly the reaction being due to the lisinopril  Past Medical History  Diagnosis Date  . Hypertension   . Asthma   . Chronic kidney disease   . Wears glasses    Past Surgical History  Procedure Laterality Date  . Eye surgery  1976  . Hernia repair     Family History  Problem Relation Age of Onset  . Asthma Father   . Kidney disease Father    History  Substance Use Topics  . Smoking status: Current Every Day Smoker -- 1.00 packs/day  . Smokeless tobacco: Not on file  . Alcohol Use: Yes     Comment: OCCASIONALLY    Review of Systems  Constitutional: Negative for fever.  HENT: Negative for congestion, sore throat and trouble swallowing.   Eyes: Negative for redness.  Respiratory: Negative for cough, shortness of breath, wheezing and stridor.   Cardiovascular: Negative for chest pain.  Gastrointestinal: Negative for abdominal pain.  Genitourinary: Negative for hematuria.  Musculoskeletal: Negative for back pain and neck pain.  Skin: Negative for rash.  Neurological: Negative for headaches.  Hematological: Does not bruise/bleed easily.  Psychiatric/Behavioral: Negative for confusion.      Allergies  Review of  patient's allergies indicates no known allergies.  Home Medications   Prior to Admission medications   Medication Sig Start Date End Date Taking? Authorizing Provider  albuterol (PROVENTIL HFA;VENTOLIN HFA) 108 (90 BASE) MCG/ACT inhaler Inhale 1-2 puffs into the lungs every 6 (six) hours as needed for wheezing or shortness of breath. 12/22/12  Yes Billy Fischer, MD  lisinopril (PRINIVIL,ZESTRIL) 20 MG tablet Take 20 mg by mouth daily.   Yes Historical Provider, MD  diphenhydrAMINE (BENADRYL) 25 MG tablet Take 1 tablet (25 mg total) by mouth every 6 (six) hours. 08/03/14   Fredia Sorrow, MD  famotidine (PEPCID) 20 MG tablet Take 1 tablet (20 mg total) by mouth 2 (two) times daily. 08/03/14   Fredia Sorrow, MD  predniSONE (DELTASONE) 10 MG tablet Take 4 tablets (40 mg total) by mouth daily. 08/03/14   Fredia Sorrow, MD   BP 134/94  Pulse 66  Temp(Src) 98.1 F (36.7 C) (Oral)  Resp 14  SpO2 99% Physical Exam  Nursing note and vitals reviewed. Constitutional: He is oriented to person, place, and time. He appears well-developed and well-nourished. No distress.  HENT:  Head: Normocephalic and atraumatic.  Mouth/Throat: Oropharynx is clear and moist.  Swelling of the lower lip. No upper lip swelling. No tongue swelling. No floor the mouth swelling.  Eyes: Conjunctivae and EOM are normal. Pupils are equal, round, and reactive to light.  Neck: Normal range of motion. No tracheal deviation present.  Cardiovascular: Normal rate, regular rhythm and normal heart sounds.   Pulmonary/Chest: Effort normal and breath sounds normal.  No respiratory distress. He has no wheezes.  Abdominal: Soft. Bowel sounds are normal. There is no tenderness.  Musculoskeletal: Normal range of motion. He exhibits no edema.  Lymphadenopathy:    He has no cervical adenopathy.  Neurological: He is alert and oriented to person, place, and time. No cranial nerve deficit. He exhibits normal muscle tone. Coordination  normal.  Skin: Skin is warm. No erythema.    ED Course  Procedures (including critical care time) Labs Review Labs Reviewed - No data to display  Imaging Review No results found.   EKG Interpretation None      MDM   Final diagnoses:  Angioedema, initial encounter    Patient with onset of lower lip swelling at about 10:00 this morning. Patient states the swelling is improving. Never had any tongue swelling upper lip swelling or trouble swallowing or trouble breathing. Patient is on lisinopril very well be the culprit. Patient is followed by primary-care for his hypertension. Followup with them to see if they want to adjust his meds. In the meantime we'll treat with Benadryl prednisone and Pepcid. Patient does not related to any other possible allergic reaction. Patient informed that the angioedema is a non-true allergy reaction. It is really a side effect of the Cipro.    Fredia Sorrow, MD 08/03/14 [phone]

## 2014-08-04 NOTE — ED Notes (Signed)
Pt requesting to leave at this time, this RN requested that pt stay in department and be monitored in the event that oral medication regime is not effective. Pt refusing to stay for any additional monitoring. This RN gave pt specific instructions on medication regime and when to come back to ED. Pt agreed to these terms.

## 2014-10-17 ENCOUNTER — Encounter (HOSPITAL_COMMUNITY): Payer: Self-pay | Admitting: *Deleted

## 2014-10-17 ENCOUNTER — Emergency Department (HOSPITAL_COMMUNITY)
Admission: EM | Admit: 2014-10-17 | Discharge: 2014-10-17 | Disposition: A | Discharge status: Home / Self Care | Payer: Self-pay | Attending: Emergency Medicine | Admitting: Emergency Medicine

## 2014-10-17 DIAGNOSIS — Z7952 Long term (current) use of systemic steroids: Secondary | ICD-10-CM | POA: Insufficient documentation

## 2014-10-17 DIAGNOSIS — R300 Dysuria: Secondary | ICD-10-CM | POA: Insufficient documentation

## 2014-10-17 DIAGNOSIS — Z720 Tobacco use: Secondary | ICD-10-CM | POA: Insufficient documentation

## 2014-10-17 DIAGNOSIS — J45909 Unspecified asthma, uncomplicated: Secondary | ICD-10-CM | POA: Insufficient documentation

## 2014-10-17 DIAGNOSIS — I129 Hypertensive chronic kidney disease with stage 1 through stage 4 chronic kidney disease, or unspecified chronic kidney disease: Secondary | ICD-10-CM | POA: Insufficient documentation

## 2014-10-17 DIAGNOSIS — Z79899 Other long term (current) drug therapy: Secondary | ICD-10-CM | POA: Insufficient documentation

## 2014-10-17 DIAGNOSIS — N189 Chronic kidney disease, unspecified: Secondary | ICD-10-CM | POA: Insufficient documentation

## 2014-10-17 LAB — URINALYSIS, ROUTINE W REFLEX MICROSCOPIC
Bilirubin Urine: NEGATIVE
Glucose, UA: NEGATIVE mg/dL
KETONES UR: NEGATIVE mg/dL
Nitrite: NEGATIVE
PROTEIN: 30 mg/dL — AB
Specific Gravity, Urine: 1.016 (ref 1.005–1.030)
UROBILINOGEN UA: 0.2 mg/dL (ref 0.0–1.0)
pH: 5.5 (ref 5.0–8.0)

## 2014-10-17 LAB — URINE MICROSCOPIC-ADD ON

## 2014-10-17 MED ORDER — CEFTRIAXONE SODIUM 250 MG IJ SOLR
250.0000 mg | Freq: Once | INTRAMUSCULAR | Status: AC
  Administered 2014-10-17: 250 mg via INTRAMUSCULAR
  Filled 2014-10-17: qty 250

## 2014-10-17 MED ORDER — AZITHROMYCIN 250 MG PO TABS
1000.0000 mg | ORAL_TABLET | Freq: Once | ORAL | Status: AC
  Administered 2014-10-17: 1000 mg via ORAL
  Filled 2014-10-17: qty 4

## 2014-10-17 MED ORDER — CIPROFLOXACIN HCL 500 MG PO TABS: 500.0000 mg | ORAL_TABLET | Freq: Two times a day (BID) | ORAL | Status: DC

## 2014-10-17 NOTE — ED Provider Notes (Signed)
CSN: [id number]     Arrival date & time 10/17/14  0243 History   First MD Initiated Contact with Patient 10/17/14 [phone]     Chief Complaint  Patient presents with  . Dysuria     (Consider location/radiation/quality/duration/timing/severity/associated sxs/prior Treatment) HPI Comments: Patient presents with complaint of dysuria and increased urinary frequency for one week. No history of UTI. Patient denies hematuria, penile discharge, testicular pain, back pain, change in bowel movements. Patient is sexually active with one partner. 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 He states that he uses protection. He thinks that he is at low risk for STI but states that "I don't know what she does". No treatments prior to arrival.  Patient is a 43 y.o. male presenting with dysuria. The history is provided by the patient.  Dysuria Pertinent negatives include no arthralgias, fever, rash or sore throat.    Past Medical History  Diagnosis Date  . Hypertension   . Asthma   . Chronic kidney disease   . Wears glasses    Past Surgical History  Procedure Laterality Date  . Eye surgery  1976  . Hernia repair     Family History  Problem Relation Age of Onset  . Asthma Father   . Kidney disease Father    History  Substance Use Topics  . Smoking status: Current Every Day Smoker -- 1.00 packs/day  . Smokeless tobacco: Not on file  . Alcohol Use: Yes     Comment: OCCASIONALLY    Review of Systems  Constitutional: Negative for fever.  HENT: Negative for sore throat.   Eyes: Negative for discharge.  Gastrointestinal: Negative for rectal pain.  Genitourinary: Positive for dysuria, frequency and penile pain. Negative for discharge, genital sores and testicular pain.  Musculoskeletal: Negative for arthralgias.  Skin: Negative for rash.  Hematological: Negative for adenopathy.   Allergies  Review of patient's allergies indicates no known allergies.  Home Medications   Prior to Admission  medications   Medication Sig Start Date End Date Taking? Authorizing Provider  albuterol (PROVENTIL HFA;VENTOLIN HFA) 108 (90 BASE) MCG/ACT inhaler Inhale 1-2 puffs into the lungs every 6 (six) hours as needed for wheezing or shortness of breath. 12/22/12   Billy Fischer, MD  diphenhydrAMINE (BENADRYL) 25 MG tablet Take 1 tablet (25 mg total) by mouth every 6 (six) hours. 08/03/14   Fredia Sorrow, MD  famotidine (PEPCID) 20 MG tablet Take 1 tablet (20 mg total) by mouth 2 (two) times daily. 08/03/14   Fredia Sorrow, MD  lisinopril (PRINIVIL,ZESTRIL) 20 MG tablet Take 20 mg by mouth daily.    Historical Provider, MD  predniSONE (DELTASONE) 10 MG tablet Take 4 tablets (40 mg total) by mouth daily. 08/03/14   Fredia Sorrow, MD   BP 124/81 mmHg  Pulse 78  Temp(Src) 98.3 F (36.8 C) (Oral)  Resp 16  Ht 5\' 9"  (1.753 m)  Wt 173 lb (78.472 kg)  BMI 25.54 kg/m2  SpO2 100%   Physical Exam  Constitutional: He appears well-developed and well-nourished.  HENT:  Head: Normocephalic and atraumatic.  Eyes: Conjunctivae are normal.  Neck: Normal range of motion. Neck supple.  Pulmonary/Chest: No respiratory distress.  Genitourinary: Penis normal. Right testis shows no mass and no tenderness. Left testis shows no tenderness. Circumcised. No penile tenderness. No discharge found.  Lymphadenopathy:       Right: No inguinal adenopathy present.       Left: No inguinal adenopathy present.  Neurological: He is alert.  Skin: Skin  is warm and dry.  Psychiatric: He has a normal mood and affect.  Nursing note and vitals reviewed.   ED Course  Procedures (including critical care time) Labs Review Labs Reviewed  URINALYSIS, ROUTINE W REFLEX MICROSCOPIC - Abnormal; Notable for the following:    APPearance CLOUDY (*)    Hgb urine dipstick TRACE (*)    Protein, ur 30 (*)    Leukocytes, UA MODERATE (*)    All other components within normal limits  URINE MICROSCOPIC-ADD ON - Abnormal; Notable for the  following:    Casts HYALINE CASTS (*)    All other components within normal limits  GC/CHLAMYDIA PROBE AMP    Imaging Review No results found.   EKG Interpretation None       6:38 AM Patient seen and examined. Work-up initiated. Medications ordered.   Vital signs reviewed and are as follows: BP 124/81 mmHg  Pulse 78  Temp(Src) 98.3 F (36.8 C) (Oral)  Resp 16  Ht 5\' 9"  (1.753 m)  Wt 173 lb (78.472 kg)  BMI 25.54 kg/m2  SpO2 100%  Pt agrees with testing and treatment for GC/chlamydia. Defers HIV.   Patient counseled on safe sexual practices. Told them that they should not have sexual contact for next 7 days and that they need to inform sexual partners so that they can get tested and treated as well. Urged f/u with [facility] for further testing.  Patient verbalizes understanding and agrees with plan.     MDM   Final diagnoses:  Dysuria   Dysuria: Concern for STI given age and history so patient treated for GC/chlamydia. Pt considers himself low risk and UA is suggestive of UTI, no objective signs of urethritis, so will also cover for complicated cystitis with cipro. Urine culture sent.     Carlisle Cater, PA-C 10/17/14 [id number]  Julianne Rice, MD 10/18/14 [phone]

## 2014-10-17 NOTE — ED Notes (Signed)
The pt is c/o burning withnurination for one week

## 2014-10-17 NOTE — Discharge Instructions (Signed)
Please read and follow all provided instructions.  Your diagnoses today include:  1. Dysuria    Tests performed today include:  Test for gonorrhea and chlamydia. You will be notified by telephone if you have a positive result.  Urine test - suggests infection  Urine culture - to ensure antibiotics prescribed work against infection  Vital signs. See below for your results today.   Medications:  You were treated for chlamydia (1 gram azithromycin pills) and gonorrhea (250mg  rocephin shot).  Home care instructions:  Read educational materials contained in this packet and follow any instructions provided.   You should tell your partners about your infection and avoid having sex for one week to allow time for the medicine to work.  Follow-up instructions: You should follow-up with the [facility] clinic to be tested for HIV, syphilis, and hepatitis -- all of which can be transmitted by sexual contact. We do not routinely screen for these in the Emergency Department.  STD Testing:  [place] [place] [facility]  [address] [place], phone [phone] or [phone]    Monday - Friday, call for an appointment  [place] [place] [facility]  [address], [place], phone [phone] or [phone]   Monday - Friday, call for an appointment  Return instructions:   Please return to the Emergency Department if you experience worsening symptoms.   Please return if you have any other emergent concerns.  Additional Information:  Your vital signs today were: BP 124/81 mmHg   Pulse 78   Temp(Src) 98.3 F (36.8 C) (Oral)   Resp 16   Ht 5\' 9"  (1.753 m)   Wt 173 lb (78.472 kg)   BMI 25.54 kg/m2   SpO2 100% If your blood pressure (BP) was elevated above 135/85 this visit, please have this repeated by your doctor within one month. --------------

## 2014-10-19 LAB — URINE CULTURE
Colony Count: >=100000
SPECIAL REQUESTS: NORMAL

## 2014-10-21 ENCOUNTER — Telehealth: Payer: Self-pay | Admitting: *Deleted

## 2014-10-21 NOTE — ED Notes (Signed)
(+)  urine culture treated with Cipro, OK per JFrens, Pharm

## 2017-01-27 ENCOUNTER — Encounter (HOSPITAL_COMMUNITY): Payer: Self-pay | Admitting: *Deleted

## 2017-01-27 ENCOUNTER — Emergency Department (HOSPITAL_COMMUNITY)
Admission: EM | Admit: 2017-01-27 | Discharge: 2017-01-27 | Disposition: A | Discharge status: Home / Self Care | Payer: Self-pay | Attending: Emergency Medicine | Admitting: Emergency Medicine

## 2017-01-27 DIAGNOSIS — J45909 Unspecified asthma, uncomplicated: Secondary | ICD-10-CM | POA: Insufficient documentation

## 2017-01-27 DIAGNOSIS — Z79899 Other long term (current) drug therapy: Secondary | ICD-10-CM | POA: Insufficient documentation

## 2017-01-27 DIAGNOSIS — I129 Hypertensive chronic kidney disease with stage 1 through stage 4 chronic kidney disease, or unspecified chronic kidney disease: Secondary | ICD-10-CM | POA: Insufficient documentation

## 2017-01-27 DIAGNOSIS — N189 Chronic kidney disease, unspecified: Secondary | ICD-10-CM | POA: Insufficient documentation

## 2017-01-27 DIAGNOSIS — M10071 Idiopathic gout, right ankle and foot: Secondary | ICD-10-CM | POA: Insufficient documentation

## 2017-01-27 DIAGNOSIS — F17200 Nicotine dependence, unspecified, uncomplicated: Secondary | ICD-10-CM | POA: Insufficient documentation

## 2017-01-27 DIAGNOSIS — M109 Gout, unspecified: Secondary | ICD-10-CM

## 2017-01-27 LAB — CBC WITH DIFFERENTIAL/PLATELET
Basophils Absolute: 0 10*3/uL (ref 0.0–0.1)
Basophils Relative: 0 %
EOS ABS: 0.2 10*3/uL (ref 0.0–0.7)
Eosinophils Relative: 3 %
HEMATOCRIT: 41.6 % (ref 39.0–52.0)
HEMOGLOBIN: 14.8 g/dL (ref 13.0–17.0)
LYMPHS ABS: 1.4 10*3/uL (ref 0.7–4.0)
LYMPHS PCT: 25 %
MCH: 35.4 pg — ABNORMAL HIGH (ref 26.0–34.0)
MCHC: 35.6 g/dL (ref 30.0–36.0)
MCV: 99.5 fL (ref 78.0–100.0)
MONOS PCT: 7 %
Monocytes Absolute: 0.4 10*3/uL (ref 0.1–1.0)
NEUTROS ABS: 3.7 10*3/uL (ref 1.7–7.7)
Neutrophils Relative %: 65 %
Platelets: 224 10*3/uL (ref 150–400)
RBC: 4.18 MIL/uL — AB (ref 4.22–5.81)
RDW: 13.6 % (ref 11.5–15.5)
WBC: 5.7 10*3/uL (ref 4.0–10.5)

## 2017-01-27 LAB — BASIC METABOLIC PANEL
Anion gap: 7 (ref 5–15)
BUN: 22 mg/dL — ABNORMAL HIGH (ref 6–20)
CHLORIDE: 101 mmol/L (ref 101–111)
CO2: 28 mmol/L (ref 22–32)
Calcium: 8.8 mg/dL — ABNORMAL LOW (ref 8.9–10.3)
Creatinine, Ser: 1.67 mg/dL — ABNORMAL HIGH (ref 0.61–1.24)
GFR calc Af Amer: 56 mL/min — ABNORMAL LOW (ref >60)
GFR calc non Af Amer: 48 mL/min — ABNORMAL LOW (ref >60)
Glucose, Bld: 105 mg/dL — ABNORMAL HIGH (ref 65–99)
POTASSIUM: 3.1 mmol/L — AB (ref 3.5–5.1)
SODIUM: 136 mmol/L (ref 135–145)

## 2017-01-27 LAB — URIC ACID: URIC ACID, SERUM: 8.8 mg/dL — AB (ref 4.4–7.6)

## 2017-01-27 MED ORDER — PREDNISONE 10 MG PO TABS: ORAL_TABLET | ORAL | 0 refills | Status: DC

## 2017-01-27 MED ORDER — HYDROCODONE-ACETAMINOPHEN 5-325 MG PO TABS: 2.0000 | ORAL_TABLET | ORAL | 0 refills | Status: DC | PRN

## 2017-01-27 NOTE — ED Triage Notes (Signed)
PT has right great toe pain with swelling and no drainage. Pt has palpable pulses

## 2017-01-27 NOTE — ED Notes (Signed)
Rt foot pain and swelling of big toe since last Friday, tender to touch

## 2017-01-27 NOTE — ED Provider Notes (Signed)
[facility] DEPT Provider Note   CSN: [id number] Arrival date & time: 01/27/17  1006  By signing my name below, I, Higinio Plan, attest that this documentation has been prepared under the direction and in the presence of non-physician practitioner, Helen Hashimoto, PA-C. Electronically Signed: Higinio Plan, Scribe. 01/27/2017. 11:53 AM.  History   Chief Complaint Chief Complaint  Patient presents with  . Foot Pain   The history is provided by the patient. No language interpreter was used.   HPI Comments: [name] is a 46 y.o. male with PMHx of CKD and HTN on Lisinopril, who presents to the Emergency Department complaining of sudden onset, gradually worsening, right great toe pain that began ~3 days ago. Pt reports associated swelling to the dorsal side of his foot with mild redness. He denies any drainage from his site of pain or hx of gout.   Past Medical History:  Diagnosis Date  . Asthma   . Chronic kidney disease   . Hypertension   . Wears glasses    Patient Active Problem List   Diagnosis Date Noted  . Tonsillar abscess 12/25/2012    Class: Acute    Past Surgical History:  Procedure Laterality Date  . EYE SURGERY  1976  . HERNIA REPAIR      Home Medications    Prior to Admission medications   Medication Sig Start Date End Date Taking? Authorizing Provider  albuterol (PROVENTIL HFA;VENTOLIN HFA) 108 (90 BASE) MCG/ACT inhaler Inhale 1-2 puffs into the lungs every 6 (six) hours as needed for wheezing or shortness of breath. 12/22/12   Billy Fischer, MD  ciprofloxacin (CIPRO) 500 MG tablet Take 1 tablet (500 mg total) by mouth 2 (two) times daily. 10/17/14   Carlisle Cater, PA-C  diphenhydrAMINE (BENADRYL) 25 MG tablet Take 1 tablet (25 mg total) by mouth every 6 (six) hours. 08/03/14   Fredia Sorrow, MD  famotidine (PEPCID) 20 MG tablet Take 1 tablet (20 mg total) by mouth 2 (two) times daily. 08/03/14   Fredia Sorrow, MD  lisinopril (PRINIVIL,ZESTRIL) 20 MG  tablet Take 20 mg by mouth daily.    Historical Provider, MD  predniSONE (DELTASONE) 10 MG tablet Take 4 tablets (40 mg total) by mouth daily. 08/03/14   Fredia Sorrow, MD    Family History Family History  Problem Relation Age of Onset  . Asthma Father   . Kidney disease Father     Social History Social History  Substance Use Topics  . Smoking status: Current Every Day Smoker    Packs/day: 1.00  . Smokeless tobacco: Never Used  . Alcohol use Yes     Comment: OCCASIONALLY   Allergies   Patient has no known allergies.  Review of Systems Review of Systems  Constitutional: Negative for fever.  Musculoskeletal: Positive for arthralgias and joint swelling.   Physical Exam Updated Vital Signs BP (!) 135/96 (BP Location: Left Arm)   Pulse 84   Temp 98.6 F (37 C) (Oral)   Resp 18   SpO2 96%   Physical Exam  Constitutional: He is oriented to person, place, and time. He appears well-developed and well-nourished.  HENT:  Head: Normocephalic.  Eyes: EOM are normal.  Neck: Normal range of motion.  Pulmonary/Chest: Effort normal.  Abdominal: He exhibits no distension.  Musculoskeletal: He exhibits edema and tenderness.  Tenderness over fisrt metarsal head. Redness and swelling to the distal side of his foot.   Neurological: He is alert and oriented to person, place, and time.  Psychiatric: He has a normal mood and affect.  Nursing note and vitals reviewed.  ED Treatments / Results  DIAGNOSTIC STUDIES:  Oxygen Saturation is 96% on RA, normal by my interpretation.    COORDINATION OF CARE:  11:48 AM Discussed treatment plan with pt at bedside and pt agreed to plan.  Labs (all labs ordered are listed, but only abnormal results are displayed) Labs Reviewed - No data to display  EKG  EKG Interpretation None       Radiology No results found.  Procedures Procedures (including critical care time)  Medications Ordered in ED Medications - No data to  display  Initial Impression / Assessment and Plan / ED Course  I have reviewed the triage vital signs and the nursing notes.  Pertinent labs & imaging results that were available during my care of the patient were reviewed by me and considered in my medical decision making (see chart for details).       Final Clinical Impressions(s) / ED Diagnoses   Final diagnoses:  Acute gout of right foot, unspecified cause    New Prescriptions Discharge Medication List as of 01/27/2017  1:14 PM    START taking these medications   Details  HYDROcodone-acetaminophen (NORCO/VICODIN) 5-325 MG tablet Take 2 tablets by mouth every 4 (four) hours as needed., Starting Mon 01/27/2017, Print      An After Visit Summary was printed and given to the patient.  I personally performed the services in this documentation, which was scribed in my presence.  The recorded information has been reviewed and considered.   Ronnald Collum.   Fransico Meadow, PA-C 01/27/17 [place], PA-C 01/27/17 [place], MD 01/31/17 [phone]

## 2017-01-31 ENCOUNTER — Telehealth: Payer: Self-pay | Admitting: *Deleted

## 2017-01-31 NOTE — Telephone Encounter (Signed)
Pharmacy called related to Rx: Prednisone quantity .Marland KitchenMarland KitchenEDCM clarified with EDP Oleta Mouse) to change Rx to: #21, as this is the quantity for tapered dose.

## 2017-09-06 ENCOUNTER — Other Ambulatory Visit: Payer: Self-pay

## 2017-09-06 ENCOUNTER — Encounter (HOSPITAL_COMMUNITY): Payer: Self-pay | Admitting: Emergency Medicine

## 2017-09-06 ENCOUNTER — Emergency Department (HOSPITAL_COMMUNITY)
Admission: EM | Admit: 2017-09-06 | Discharge: 2017-09-06 | Disposition: A | Discharge status: Home / Self Care | Payer: Self-pay | Attending: Physician Assistant | Admitting: Physician Assistant

## 2017-09-06 DIAGNOSIS — F17200 Nicotine dependence, unspecified, uncomplicated: Secondary | ICD-10-CM | POA: Insufficient documentation

## 2017-09-06 DIAGNOSIS — Z79899 Other long term (current) drug therapy: Secondary | ICD-10-CM | POA: Insufficient documentation

## 2017-09-06 DIAGNOSIS — I129 Hypertensive chronic kidney disease with stage 1 through stage 4 chronic kidney disease, or unspecified chronic kidney disease: Secondary | ICD-10-CM | POA: Insufficient documentation

## 2017-09-06 DIAGNOSIS — J45909 Unspecified asthma, uncomplicated: Secondary | ICD-10-CM | POA: Insufficient documentation

## 2017-09-06 DIAGNOSIS — N189 Chronic kidney disease, unspecified: Secondary | ICD-10-CM | POA: Insufficient documentation

## 2017-09-06 DIAGNOSIS — B360 Pityriasis versicolor: Secondary | ICD-10-CM | POA: Insufficient documentation

## 2017-09-06 LAB — COMPREHENSIVE METABOLIC PANEL
ALT: 25 U/L (ref 17–63)
AST: 20 U/L (ref 15–41)
Albumin: 3.3 g/dL — ABNORMAL LOW (ref 3.5–5.0)
Alkaline Phosphatase: 83 U/L (ref 38–126)
Anion gap: 10 (ref 5–15)
BUN: 15 mg/dL (ref 6–20)
CALCIUM: 8.4 mg/dL — AB (ref 8.9–10.3)
CHLORIDE: 102 mmol/L (ref 101–111)
CO2: 25 mmol/L (ref 22–32)
Creatinine, Ser: 1.6 mg/dL — ABNORMAL HIGH (ref 0.61–1.24)
GFR, EST AFRICAN AMERICAN: 59 mL/min — AB (ref >60)
GFR, EST NON AFRICAN AMERICAN: 51 mL/min — AB (ref >60)
Glucose, Bld: 108 mg/dL — ABNORMAL HIGH (ref 65–99)
Potassium: 3.2 mmol/L — ABNORMAL LOW (ref 3.5–5.1)
Sodium: 137 mmol/L (ref 135–145)
Total Bilirubin: 0.7 mg/dL (ref 0.3–1.2)
Total Protein: 7.4 g/dL (ref 6.5–8.1)

## 2017-09-06 LAB — CBC WITH DIFFERENTIAL/PLATELET
BASOS PCT: 1 %
Basophils Absolute: 0 10*3/uL (ref 0.0–0.1)
EOS ABS: 1 10*3/uL — AB (ref 0.0–0.7)
Eosinophils Relative: 15 %
HCT: 40.1 % (ref 39.0–52.0)
HEMOGLOBIN: 14.1 g/dL (ref 13.0–17.0)
Lymphocytes Relative: 19 %
Lymphs Abs: 1.4 10*3/uL (ref 0.7–4.0)
MCH: 35.6 pg — ABNORMAL HIGH (ref 26.0–34.0)
MCHC: 35.2 g/dL (ref 30.0–36.0)
MCV: 101.3 fL — ABNORMAL HIGH (ref 78.0–100.0)
Monocytes Absolute: 0.4 10*3/uL (ref 0.1–1.0)
Monocytes Relative: 6 %
NEUTROS PCT: 59 %
Neutro Abs: 4.2 10*3/uL (ref 1.7–7.7)
Platelets: 258 10*3/uL (ref 150–400)
RBC: 3.96 MIL/uL — AB (ref 4.22–5.81)
RDW: 13 % (ref 11.5–15.5)
WBC: 7.1 10*3/uL (ref 4.0–10.5)

## 2017-09-06 MED ORDER — KETOCONAZOLE 2 % EX SHAM: MEDICATED_SHAMPOO | CUTANEOUS | 0 refills | Status: DC

## 2017-09-06 MED ORDER — FLUCONAZOLE 150 MG PO TABS
300.0000 mg | ORAL_TABLET | Freq: Every day | ORAL | 0 refills | Status: AC
Start: 2017-09-06 — End: 2017-09-07

## 2017-09-06 MED ORDER — FLUCONAZOLE 100 MG PO TABS
300.0000 mg | ORAL_TABLET | Freq: Once | ORAL | Status: AC
  Administered 2017-09-06: 300 mg via ORAL
  Filled 2017-09-06: qty 3

## 2017-09-06 NOTE — ED Notes (Signed)
Declined W/C at D/C and was escorted to lobby by RN. 

## 2017-09-06 NOTE — ED Provider Notes (Signed)
[facility]ERG[facility] DEPARTMENT Provider Note   CSN: [id number] Arrival date & time: 09/06/17  1152     History   Chief Complaint Chief Complaint  Patient presents with  . Skin Problem    HPI [name] is a 46 y.o. male with a history of CKD who presents to the emergency department with a chief complaint of rash.  He reports a scaling, pruritic rash on the bilateral hands with discoloration that began 1 month ago after using flea and tick powder for his dog.  He reports the rash has since spread to his bilateral arms, legs, abdomen, back.  No rash to the face.  He denies fevers, chills.  No sick contacts.  He has been treating the rash by applying triamcinolone cream to his hands without improvement.  No rash to the tongue.  No history of HIV or diabetes mellitus.  The history is provided by the patient. No language interpreter was used.  Rash   This is a new problem. The current episode started more than 1 week ago. The problem has been gradually worsening. The problem is associated with chemical exposure. There has been no fever. The rash is present on the torso, back, abdomen, trunk, left hand, left arm, left upper leg, left lower leg, left foot, right upper leg, right lower leg, right foot and right hand. The patient is experiencing no pain. Associated symptoms include itching and pain. Pertinent negatives include no blisters and no weeping. He has tried steriods for the symptoms. The treatment provided no relief. Risk factors include new environmental exposures.    Past Medical History:  Diagnosis Date  . Asthma   . Chronic kidney disease   . Hypertension   . Wears glasses     Patient Active Problem List   Diagnosis Date Noted  . Tonsillar abscess 12/25/2012    Class: Acute    Past Surgical History:  Procedure Laterality Date  . EYE SURGERY  1976  . HERNIA REPAIR         Home Medications    Prior to Admission medications   Medication  Sig Start Date End Date Taking? Authorizing Provider  albuterol (PROVENTIL HFA;VENTOLIN HFA) 108 (90 BASE) MCG/ACT inhaler Inhale 1-2 puffs into the lungs every 6 (six) hours as needed for wheezing or shortness of breath. 12/22/12   Billy Fischer, MD  ciprofloxacin (CIPRO) 500 MG tablet Take 1 tablet (500 mg total) by mouth 2 (two) times daily. 10/17/14   Carlisle Cater, PA-C  diphenhydrAMINE (BENADRYL) 25 MG tablet Take 1 tablet (25 mg total) by mouth every 6 (six) hours. 08/03/14   Fredia Sorrow, MD  famotidine (PEPCID) 20 MG tablet Take 1 tablet (20 mg total) by mouth 2 (two) times daily. 08/03/14   Fredia Sorrow, MD  fluconazole (DIFLUCAN) 150 MG tablet Take 2 tablets (300 mg total) daily for 1 dose by mouth. 09/06/17 09/07/17  McDonald, Mia A, PA-C  HYDROcodone-acetaminophen (NORCO/VICODIN) 5-325 MG tablet Take 2 tablets by mouth every 4 (four) hours as needed. 01/27/17   Fransico Meadow, PA-C  ketoconazole (NIZORAL) 2 % shampoo Apply the shampoo to affected areas and wash off after 5 minutes. 09/06/17   McDonald, Mia A, PA-C  lisinopril (PRINIVIL,ZESTRIL) 20 MG tablet Take 20 mg by mouth daily.    [provider]  predniSONE (DELTASONE) 10 MG tablet 6,5,4,3,2,1 taper 01/27/17   Fransico Meadow, PA-C    Family History Family History  Problem Relation Age of Onset  .  Asthma Father   . Kidney disease Father     Social History Social History   Tobacco Use  . Smoking status: Current Every Day Smoker    Packs/day: 1.00  . Smokeless tobacco: Never Used  Substance Use Topics  . Alcohol use: Yes    Comment: OCCASIONALLY  . Drug use: No     Allergies   Patient has no known allergies.   Review of Systems Review of Systems  Constitutional: Negative for chills and fever.  Cardiovascular: Negative for chest pain.  Skin: Positive for itching and rash. Negative for wound.     Physical Exam Updated Vital Signs BP (!) 140/99 (BP Location: Right Arm)   Pulse 99   Temp  98.1 F (36.7 C) (Oral)   Resp 16   Ht 5\' 9"  (1.753 m)   Wt 79.4 kg (175 lb)   SpO2 100%   BMI 25.84 kg/m   Physical Exam  Constitutional: He appears well-developed.  HENT:  Head: Normocephalic.  No thrush noted.  Eyes: Conjunctivae are normal.  Neck: Neck supple.  Cardiovascular: Normal rate and regular rhythm.  No murmur heard. Pulmonary/Chest: Effort normal.  Abdominal: Soft. He exhibits no distension.  Neurological: He is alert.  Skin: Skin is warm and dry. Rash noted.  Scaling rash noted to the bilateral arms, legs, abdomen, and back.  Discolored black patches noted to the bilateral hands.  Macerated skin is noted to the bilateral hands in the palmar creases.  No maculopapular lesions, bulla, or vesicles.  No sloughing of the skin.  The rash spares the face.  Please see the photos below for more details.  Psychiatric: His behavior is normal.  Nursing note and vitals reviewed.    ED Treatments / Results  Labs (all labs ordered are listed, but only abnormal results are displayed) Labs Reviewed  COMPREHENSIVE METABOLIC PANEL - Abnormal; Notable for the following components:      Result Value   Potassium 3.2 (*)    Glucose, Bld 108 (*)    Creatinine, Ser 1.60 (*)    Calcium 8.4 (*)    Albumin 3.3 (*)    GFR calc non Af Amer 51 (*)    GFR calc Af Amer 59 (*)    All other components within normal limits  CBC WITH DIFFERENTIAL/PLATELET - Abnormal; Notable for the following components:   RBC 3.96 (*)    MCV 101.3 (*)    MCH 35.6 (*)    Eosinophils Absolute 1.0 (*)    All other components within normal limits    EKG  EKG Interpretation None       Radiology No results found.  Procedures Procedures (including critical care time)  Medications Ordered in ED Medications  fluconazole (DIFLUCAN) tablet 300 mg (300 mg Oral Given 09/06/17 1614)     Initial Impression / Assessment and Plan / ED Course  I have reviewed the triage vital signs and the nursing  notes.  Pertinent labs & imaging results that were available during my care of the patient were reviewed by me and considered in my medical decision making (see chart for details).     46 year old male with a history of chronic kidney disease presenting with a pruritic, scaly rash x1 1 month to the bilateral hands, arms, legs, abdomen, and back.  The rash spares the face.  Macerated skin is present in the palmar creases on the bilateral hands, concerning for fungal origin.  The rash appears consistent with tinea versicolor.  GFR is  59 today.  No thrush noted.  No signs of disseminated fungal infection.  Spoke with pharmacy regarding fluconazole dosing for slightly decreased GFR.  No dosing changes needed.  The patient was given 300 mg of fluconazole in the ED today and an Rx for 300 mg to take 1 week from today.  We will also discharge the patient with ketoconazole shampoo to be used daily until the rash improves.  Referral to dermatology given.  The patient was discussed with Dr. Thomasene Lot, attending physician.  Strict return precautions given.  No acute distress.  The patient is safe for discharge at this time.  Final Clinical Impressions(s) / ED Diagnoses   Final diagnoses:  Tinea versicolor    ED Discharge Orders        Ordered    fluconazole (DIFLUCAN) 150 MG tablet  Daily     09/06/17 1612    ketoconazole (NIZORAL) 2 % shampoo     09/06/17 1612       McDonald, Mia A, PA-C 09/06/17 1625    Mackuen, Fredia Sorrow, MD 09/07/17 2306

## 2017-09-06 NOTE — ED Triage Notes (Signed)
Pt reports getting flea and tick dog spray on his skin last months, states, "My skin all over except for my face has been like this since."  Pt denies fever, chills, discharge or oozing, reports using Cortisone 10 and vaseline without improvement.  Skin is raised, discolored, peeling in areas.

## 2017-09-06 NOTE — Discharge Instructions (Signed)
1 week from today, next Saturday, take 2 tablets of fluconazole.  Apply the ketoconazole shampoo to areas that are affected while you are in the shower.  Leave on the skin for 5 minutes, then wash it off.  Complete this daily until the rash improves.  Please call and follow-up with a dermatologist within the next week.  I have attached the name of several dermatology practices in the area, but any dermatology practices in [place] would be okay.  Do not continue to use the steroid cream that you have been applying to your hands.  If you develop new or worsening symptoms, including fever or chills, thick white patches on upir tongue, headache, or neck stiffness, please return to the emergency department for reevaluation.

## 2017-09-15 ENCOUNTER — Emergency Department (HOSPITAL_COMMUNITY)
Admission: EM | Admit: 2017-09-15 | Discharge: 2017-09-15 | Disposition: A | Discharge status: Home / Self Care | Payer: Self-pay | Attending: Emergency Medicine | Admitting: Emergency Medicine

## 2017-09-15 ENCOUNTER — Other Ambulatory Visit: Payer: Self-pay

## 2017-09-15 ENCOUNTER — Encounter (HOSPITAL_COMMUNITY): Payer: Self-pay

## 2017-09-15 DIAGNOSIS — N189 Chronic kidney disease, unspecified: Secondary | ICD-10-CM | POA: Insufficient documentation

## 2017-09-15 DIAGNOSIS — Z79899 Other long term (current) drug therapy: Secondary | ICD-10-CM | POA: Insufficient documentation

## 2017-09-15 DIAGNOSIS — L089 Local infection of the skin and subcutaneous tissue, unspecified: Secondary | ICD-10-CM | POA: Insufficient documentation

## 2017-09-15 DIAGNOSIS — I129 Hypertensive chronic kidney disease with stage 1 through stage 4 chronic kidney disease, or unspecified chronic kidney disease: Secondary | ICD-10-CM | POA: Insufficient documentation

## 2017-09-15 DIAGNOSIS — F17200 Nicotine dependence, unspecified, uncomplicated: Secondary | ICD-10-CM | POA: Insufficient documentation

## 2017-09-15 DIAGNOSIS — J45909 Unspecified asthma, uncomplicated: Secondary | ICD-10-CM | POA: Insufficient documentation

## 2017-09-15 DIAGNOSIS — L209 Atopic dermatitis, unspecified: Secondary | ICD-10-CM | POA: Insufficient documentation

## 2017-09-15 MED ORDER — SULFAMETHOXAZOLE-TRIMETHOPRIM 800-160 MG PO TABS: 1.0000 | ORAL_TABLET | Freq: Two times a day (BID) | ORAL | 0 refills | Status: AC

## 2017-09-15 MED ORDER — PREDNISONE 10 MG PO TABS: 40.0000 mg | ORAL_TABLET | Freq: Every day | ORAL | 0 refills | Status: AC

## 2017-09-15 MED ORDER — EUCERIN EX CREA: TOPICAL_CREAM | CUTANEOUS | 0 refills | Status: DC | PRN

## 2017-09-15 MED ORDER — LORATADINE 10 MG PO TABS
10.0000 mg | ORAL_TABLET | Freq: Once | ORAL | Status: AC
  Administered 2017-09-15: 10 mg via ORAL
  Filled 2017-09-15: qty 1

## 2017-09-15 MED ORDER — PREDNISONE 20 MG PO TABS
60.0000 mg | ORAL_TABLET | Freq: Once | ORAL | Status: AC
  Administered 2017-09-15: 60 mg via ORAL
  Filled 2017-09-15: qty 3

## 2017-09-15 MED ORDER — TRIAMCINOLONE ACETONIDE 0.1 % EX CREA: 1.0000 "application " | TOPICAL_CREAM | Freq: Two times a day (BID) | CUTANEOUS | 0 refills | Status: DC

## 2017-09-15 NOTE — ED Provider Notes (Signed)
[facility] EMERG[facility] DEPARTMENT Provider Note   CSN: [id number] Arrival date & time: 09/15/17  0709     History   Chief Complaint Chief Complaint  Patient presents with  . Wound Infection    HPI [name] is a 46 y.o. male history of asthma, CK D, hypertension presenting with generalized severe atopic dermatitis and reporting no improvement after last prescription for Diflucan. Rash has been very pruritic. No history of immunocompromised conditions including diabetes or HIV. He was referred to dermatology and has the phone number but has not followed up yet. He has tried topical cream without relief. He states that his hands may have improved from last time as the skin appears to be regenerating. He is reporting a foul smell. Denies fever, chills, nausea, vomiting or other symptoms.  HPI  Past Medical History:  Diagnosis Date  . Asthma   . Chronic kidney disease   . Hypertension   . Wears glasses     Patient Active Problem List   Diagnosis Date Noted  . Tonsillar abscess 12/25/2012    Class: Acute    Past Surgical History:  Procedure Laterality Date  . EYE SURGERY  1976  . HERNIA REPAIR         Home Medications    Prior to Admission medications   Medication Sig Start Date End Date Taking? Authorizing Provider  albuterol (PROVENTIL HFA;VENTOLIN HFA) 108 (90 BASE) MCG/ACT inhaler Inhale 1-2 puffs into the lungs every 6 (six) hours as needed for wheezing or shortness of breath. 12/22/12   Billy Fischer, MD  ciprofloxacin (CIPRO) 500 MG tablet Take 1 tablet (500 mg total) by mouth 2 (two) times daily. 10/17/14   Carlisle Cater, PA-C  diphenhydrAMINE (BENADRYL) 25 MG tablet Take 1 tablet (25 mg total) by mouth every 6 (six) hours. 08/03/14   Fredia Sorrow, MD  famotidine (PEPCID) 20 MG tablet Take 1 tablet (20 mg total) by mouth 2 (two) times daily. 08/03/14   Fredia Sorrow, MD  HYDROcodone-acetaminophen (NORCO/VICODIN) 5-325 MG tablet  Take 2 tablets by mouth every 4 (four) hours as needed. 01/27/17   Fransico Meadow, PA-C  ketoconazole (NIZORAL) 2 % shampoo Apply the shampoo to affected areas and wash off after 5 minutes. 09/06/17   McDonald, Mia A, PA-C  lisinopril (PRINIVIL,ZESTRIL) 20 MG tablet Take 20 mg by mouth daily.    [provider]  predniSONE (DELTASONE) 10 MG tablet Take 4 tablets (40 mg total) by mouth daily with breakfast for 4 days. 09/15/17 09/19/17  Emeline General, PA-C  Skin Protectants, Misc. (EUCERIN) cream Apply topically as needed for dry skin. 09/15/17   Avie Echevaria B, PA-C  sulfamethoxazole-trimethoprim (BACTRIM DS,SEPTRA DS) 800-160 MG tablet Take 1 tablet by mouth 2 (two) times daily for 7 days. 09/15/17 09/22/17  Avie Echevaria B, PA-C  triamcinolone cream (KENALOG) 0.1 % Apply 1 application topically 2 (two) times daily. 09/15/17   Emeline General, PA-C    Family History Family History  Problem Relation Age of Onset  . Asthma Father   . Kidney disease Father     Social History Social History   Tobacco Use  . Smoking status: Current Every Day Smoker    Packs/day: 1.00  . Smokeless tobacco: Never Used  Substance Use Topics  . Alcohol use: Yes    Comment: OCCASIONALLY  . Drug use: No     Allergies   Patient has no known allergies.   Review of Systems Review of Systems  Constitutional: Negative for chills and fever.  Respiratory: Negative for shortness of breath, wheezing and stridor.   Cardiovascular: Negative for chest pain and palpitations.  Gastrointestinal: Negative for abdominal pain, nausea and vomiting.  Musculoskeletal: Negative for myalgias, neck pain and neck stiffness.  Skin: Positive for color change, rash and wound. Negative for pallor.  Neurological: Negative for dizziness, light-headedness and headaches.     Physical Exam Updated Vital Signs BP 123/78 (BP Location: Right Arm)   Pulse (!) 116   Temp 98.8 F (37.1 C) (Oral)   Resp 17    SpO2 98%   Physical Exam  Constitutional: He appears well-developed and well-nourished. No distress.  Afebrile, nontoxic-appearing, sitting comfortably in chair in no acute distress.  HENT:  Head: Normocephalic and atraumatic.  Eyes: Conjunctivae are normal.  Neck: Neck supple.  Cardiovascular: Normal rate, regular rhythm, normal heart sounds and intact distal pulses.  No murmur heard. Pulmonary/Chest: Effort normal and breath sounds normal. No stridor. No respiratory distress. He has no wheezes. He has no rales.  Musculoskeletal: Normal range of motion. He exhibits no edema.  Neurological: He is alert.  Skin: Skin is warm and dry. Rash noted. He is not diaphoretic.  Generalized severe atopic dermatitis with palms cracking and with drainage as well as foul smell.  Psychiatric: He has a normal mood and affect.  Nursing note and vitals reviewed.       ED Treatments / Results  Labs (all labs ordered are listed, but only abnormal results are displayed) Labs Reviewed - No data to display  EKG  EKG Interpretation None       Radiology No results found.  Procedures Procedures (including critical care time)  Medications Ordered in ED Medications  predniSONE (DELTASONE) tablet 60 mg (60 mg Oral Given 09/15/17 1054)  loratadine (CLARITIN) tablet 10 mg (10 mg Oral Given 09/15/17 1054)     Initial Impression / Assessment and Plan / ED Course  I have reviewed the triage vital signs and the nursing notes.  Pertinent labs & imaging results that were available during my care of the patient were reviewed by me and considered in my medical decision making (see chart for details).    Patient presenting with severe atopic dermatitis and superimposed  Infection. She has not been improving after antifungal.  Urged patient to follow up with dermatology and will discharge home with prednisone, Bactrim and symptomatic relief and close follow-up.  Patient is otherwise  well-appearing, nontoxic afebrile.  Discussed strict return precautions and advised to return to the emergency department if experiencing any new or worsening symptoms. Instructions were understood and patient agreed with discharge plan.   Final Clinical Impressions(s) / ED Diagnoses   Final diagnoses:  Atopic dermatitis, unspecified type  Skin infection    ED Discharge Orders        Ordered    Skin Protectants, Misc. (EUCERIN) cream  As needed     09/15/17 1102    triamcinolone cream (KENALOG) 0.1 %  2 times daily     09/15/17 1102    sulfamethoxazole-trimethoprim (BACTRIM DS,SEPTRA DS) 800-160 MG tablet  2 times daily     09/15/17 1102    predniSONE (DELTASONE) 10 MG tablet  Daily with breakfast     09/15/17 1102       Dossie Der 09/15/17 1110    Forde Dandy, MD 09/15/17 1651

## 2017-09-15 NOTE — ED Notes (Signed)
Patient here with complaints of infection to both hands  Onset several weeks ago. States he was using flee medication on his dogs and now his hands are infected.

## 2017-09-15 NOTE — Discharge Instructions (Signed)
As discussed, take entire course of antibiotics even if you feel better. Use triamcinolone cream on affected areas and apply eucerin cream to protect your skin. Keep your skin covered. Take prednisone daily starting tomorrow and follow up with the dermatology office. Stay well hydrated and use antihistamines as needed for itching (benadryl or zyrtec or claritin).

## 2017-09-15 NOTE — ED Triage Notes (Signed)
Per Pt, Pt is coming from home with worsening hand infection that started about a month ago. Pt reports being seen here and told to return if it didn't get better.

## 2017-10-08 DIAGNOSIS — B000 Eczema herpeticum: Secondary | ICD-10-CM | POA: Insufficient documentation

## 2018-04-22 ENCOUNTER — Encounter: Payer: Self-pay | Admitting: Internal Medicine

## 2018-04-22 ENCOUNTER — Ambulatory Visit: Payer: Self-pay | Admitting: Internal Medicine

## 2018-04-22 ENCOUNTER — Other Ambulatory Visit: Payer: Self-pay

## 2018-04-22 VITALS — BP 119/79 | HR 77 | Temp 98.2°F | Ht 69.0 in | Wt 165.7 lb

## 2018-04-22 DIAGNOSIS — F17210 Nicotine dependence, cigarettes, uncomplicated: Secondary | ICD-10-CM

## 2018-04-22 DIAGNOSIS — N189 Chronic kidney disease, unspecified: Secondary | ICD-10-CM

## 2018-04-22 DIAGNOSIS — Q613 Polycystic kidney, unspecified: Secondary | ICD-10-CM

## 2018-04-22 DIAGNOSIS — I129 Hypertensive chronic kidney disease with stage 1 through stage 4 chronic kidney disease, or unspecified chronic kidney disease: Secondary | ICD-10-CM

## 2018-04-22 DIAGNOSIS — Z841 Family history of disorders of kidney and ureter: Secondary | ICD-10-CM

## 2018-04-22 DIAGNOSIS — Z560 Unemployment, unspecified: Secondary | ICD-10-CM

## 2018-04-22 DIAGNOSIS — B000 Eczema herpeticum: Secondary | ICD-10-CM

## 2018-04-22 DIAGNOSIS — I10 Essential (primary) hypertension: Secondary | ICD-10-CM

## 2018-04-22 LAB — POCT URINALYSIS DIPSTICK
BILIRUBIN UA: NEGATIVE
Glucose, UA: NEGATIVE
Ketones, UA: NEGATIVE
Leukocytes, UA: NEGATIVE
NITRITE UA: NEGATIVE
PH UA: 5.5 (ref 5.0–8.0)
Protein, UA: POSITIVE — AB
Urobilinogen, UA: 0.2 E.U./dL

## 2018-04-22 MED ORDER — LISINOPRIL 20 MG PO TABS: 20.0000 mg | ORAL_TABLET | Freq: Every day | ORAL | 3 refills | Status: DC

## 2018-04-22 NOTE — Progress Notes (Signed)
[name] presented today to establish care with our clinic. He is currently unemployed with no form of income and in the process of enrolling in the orange card program. However, he had previously been using Dupixent (dupilumab) for eczema, which is not covered by the program. Patient was given the patient assistance program enrollment application for [employer] and told to request for his dermatologist to fill out the required prescriber information and fax it to the company.   Additionally, the patient stated he was out of his albuterol inhaler so he was provided a sample of Proventil.  Brett Fairy, PharmD Candidate

## 2018-04-22 NOTE — Assessment & Plan Note (Signed)
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  His blood pressure is excellent today.   -continue lisinopril 20mg  daily -will check renal function panel

## 2018-04-22 NOTE — Patient Instructions (Addendum)
[name].  We are trying to help you get your dupilumab.  We will get you in to see [place] for an orange card.  I have refilled your lisinopril your blood pressure is excellent today.  We will check a urinalysis and some blood work today to monitor your renal function.  We will have you come back in in about 3 months or earlier if it is required to get you your dermatology visit or based on the results of your renal function.

## 2018-04-22 NOTE — Progress Notes (Signed)
CC: establish care,  Eczema, HTN  HPI:  [name] is a 47 y.o. male who is here to establish care with our clinic.  Today we will focus on getting a good history addressing his CKD, HTN and eczema.  He has recently lost his insurance he was previously seeing a dermatologist for his severe eczema and a nephrologist for his CKD.    Please see A&P for status of the patient's chronic medical conditions  Past Medical History:  Diagnosis Date  . Asthma   . Chronic kidney disease   . Eczema herpeticum   . Erythroderma   . Erythroderma   . Hypertension   . Wears glasses   erythroderma  Review of Systems:   Review of Systems  Constitutional: Negative for chills and fever.  HENT: Negative for congestion and nosebleeds.   Eyes: Negative for blurred vision and double vision.  Respiratory: Negative for shortness of breath and wheezing.   Cardiovascular: Negative for chest pain and palpitations.  Gastrointestinal: Negative for nausea and vomiting.  Genitourinary: Negative for dysuria and frequency.  Musculoskeletal: Negative for back pain and joint pain.  Skin: Positive for itching.  Neurological: Negative for dizziness and headaches.  Psychiatric/Behavioral: Negative for depression and suicidal ideas.    Physical Exam:  Vitals:   04/22/18 0917  BP: 119/79  Pulse: 77  Temp: 98.2 F (36.8 C)  TempSrc: Oral  Weight: 165 lb 11.2 oz (75.2 kg)  Height: 5\' 9"  (1.753 m)   Physical Exam  Eyes: Right eye exhibits no discharge. Left eye exhibits no discharge. No scleral icterus.  Cardiovascular: Normal rate, regular rhythm, normal heart sounds and intact distal pulses. Exam reveals no gallop and no friction rub.  No murmur heard. Pulmonary/Chest: Effort normal and breath sounds normal. No respiratory distress. He has no wheezes. He has no rales.  Neurological: He is alert.  Skin: He is not diaphoretic.  There is resolved eczema herpeticum vesicular rash on the face arms and  torso.  There is more active disease on the palmar and dorsal surfaces of bilateral hands.  The feet has some active disease but to a lesser extent.  Psychiatric: His affect is labile. He expresses impulsivity.     Media Information         Document Information   Photos    04/22/2018 09:40  Attached To:  Office Visit on 04/22/18 with Katherine Roan, MD   Source Information   Katherine Roan, MD  [facility]      Media Information         Document Information   Photos    04/22/2018 09:40  Attached To:  Office Visit on 04/22/18 with Katherine Roan, MD   Source Information   Katherine Roan, MD  [facility]        Media Information         Document Information   Photos    04/22/2018 09:39  Attached To:  Office Visit on 04/22/18 with Katherine Roan, MD   Source Information   Deantre Bourdon, Jenne Pane, MD  [facility]   Social History   Socioeconomic History  . Marital status: Married    Spouse name: Not on file  . Number of children: Not on file  . Years of education: Not on file  . Highest education level: Not on file  Occupational History  . Not on file  Social Needs  . Financial resource strain: Not  on file  . Food insecurity:    Worry: Not on file    Inability: Not on file  . Transportation needs:    Medical: Not on file    Non-medical: Not on file  Tobacco Use  . Smoking status: Current Every Day Smoker    Packs/day: 1.00  . Smokeless tobacco: Never Used  Substance and Sexual Activity  . Alcohol use: Yes    Comment: OCCASIONALLY  . Drug use: No  . Sexual activity: Yes    Birth control/protection: Condom  Lifestyle  . Physical activity:    Days per week: Not on file    Minutes per session: Not on file  . Stress: Not on file  Relationships  . Social connections:    Talks on phone: Not on file    Gets together: Not on file    Attends religious service: Not on file    Active  member of club or organization: Not on file    Attends meetings of clubs or organizations: Not on file    Relationship status: Not on file  . Intimate partner violence:    Fear of current or ex partner: Not on file    Emotionally abused: Not on file    Physically abused: Not on file    Forced sexual activity: Not on file  Other Topics Concern  . Not on file  Social History Narrative  . Not on file  smokes 1 ppd doesn't want to quit Has DWI in May hasn't drank since No recreational drug use  Family History  Problem Relation Age of Onset  . Asthma Father   . Kidney disease Father   . Eczema Sister     Assessment & Plan:   See Encounters Tab for problem based charting.  Patient discussed with Dr. Dareen Piano

## 2018-04-22 NOTE — Progress Notes (Signed)
[facility] Clinic Attending  Case discussed with Dr. Winfrey  at the time of the visit.  We reviewed the resident's history and exam and pertinent patient test results.  I agree with the assessment, diagnosis, and plan of care documented in the resident's note.  

## 2018-04-22 NOTE — Assessment & Plan Note (Addendum)
Pt has a strong family history of autosomal dominant polycystic kidney disease.  His mother has had two transplants.  He is currently on lisinopril 20mg  and his blood pressure is under good control he is CKD 3 at this point.    -we will check renal function and a urinalysis today -we will also check a renal function panel -we will work to get him the orange card, he will need to establish with nephrology eventually

## 2018-04-22 NOTE — Assessment & Plan Note (Signed)
One of the more severe cases I have seen.  Pt has high dose steroid cream and eucerin came in wearing gloves on hands after cream applied.  Showed me some prior pictures of his skin and this is the best it has looked in some time.  He was started on dupilumab an IL-4 inhibitor by his dermatologist for his severe disease which has been working for him. Unfortunately due to losing his job and insurance the medication is too expensive.  -Mannie Stabile our pharmacist working to get him medication assistance to continue injections -continue current topical therapy -will get orange card, attempt to see if pts current dermatologist would accept him for a visit without insurance.  If not we will explore other options.

## 2018-04-23 LAB — RENAL FUNCTION PANEL
Albumin: 4.1 g/dL (ref 3.5–5.5)
BUN/Creatinine Ratio: 12 (ref 9–20)
BUN: 19 mg/dL (ref 6–24)
CO2: 23 mmol/L (ref 20–29)
Calcium: 9.1 mg/dL (ref 8.7–10.2)
Chloride: 104 mmol/L (ref 96–106)
Creatinine, Ser: 1.58 mg/dL — ABNORMAL HIGH (ref 0.76–1.27)
GFR calc Af Amer: 60 mL/min/{1.73_m2} (ref >59)
GFR calc non Af Amer: 52 mL/min/{1.73_m2} — ABNORMAL LOW (ref >59)
Glucose: 87 mg/dL (ref 65–99)
PHOSPHORUS: 4 mg/dL (ref 2.5–4.5)
Potassium: 4.1 mmol/L (ref 3.5–5.2)
SODIUM: 143 mmol/L (ref 134–144)

## 2018-04-27 ENCOUNTER — Encounter: Payer: Self-pay | Admitting: Internal Medicine

## 2018-05-06 ENCOUNTER — Telehealth: Payer: Self-pay | Admitting: *Deleted

## 2018-05-06 DIAGNOSIS — I10 Essential (primary) hypertension: Secondary | ICD-10-CM

## 2018-05-06 MED ORDER — LOSARTAN POTASSIUM-HCTZ 100-25 MG PO TABS: 1.0000 | ORAL_TABLET | Freq: Every day | ORAL | 3 refills | Status: DC

## 2018-05-06 MED FILL — LOSARTAN-HCTZ 100-25 MG TAB: 100-25 | 30 days supply | Qty: 30 | Fill #0

## 2018-05-06 NOTE — Telephone Encounter (Signed)
Refilled medication, pt has been taking long term and doing well with it.

## 2018-05-06 NOTE — Telephone Encounter (Signed)
Patient walked in states lisinopril in past caused lip swelling and was placed on losartan/HCTZ 100-25 mg once daily by nephrologist Corliss Parish, MD). Has been taking this med for 1-1.5 years. Requesting this med to be sent to [facility]. Lisinopril placed on allergy list. Hubbard Hartshorn, RN, BSN

## 2018-05-15 ENCOUNTER — Ambulatory Visit: Payer: Self-pay

## 2018-05-22 ENCOUNTER — Ambulatory Visit: Payer: Self-pay

## 2018-06-18 ENCOUNTER — Ambulatory Visit: Payer: Self-pay

## 2018-06-18 ENCOUNTER — Encounter: Payer: Self-pay | Admitting: Internal Medicine

## 2018-06-30 MED FILL — LOSARTAN-HCTZ 100-25 MG TAB: 100-25 | 30 days supply | Qty: 30 | Fill #1

## 2018-07-29 ENCOUNTER — Ambulatory Visit: Payer: Self-pay

## 2018-07-30 ENCOUNTER — Ambulatory Visit: Payer: Self-pay

## 2018-07-30 MED FILL — LOSARTAN-HCTZ 100-25 MG TAB: 100-25 | 30 days supply | Qty: 30 | Fill #2

## 2018-08-21 DIAGNOSIS — I82409 Acute embolism and thrombosis of unspecified deep veins of unspecified lower extremity: Secondary | ICD-10-CM

## 2018-08-21 HISTORY — DX: Acute embolism and thrombosis of unspecified deep veins of unspecified lower extremity: I82.409

## 2018-09-15 ENCOUNTER — Telehealth: Payer: Self-pay | Admitting: *Deleted

## 2018-09-15 DIAGNOSIS — I10 Essential (primary) hypertension: Secondary | ICD-10-CM

## 2018-09-15 MED ORDER — HYDROCHLOROTHIAZIDE 25 MG PO TABS: 25.0000 mg | ORAL_TABLET | Freq: Every day | ORAL | 2 refills | Status: DC

## 2018-09-15 MED ORDER — LOSARTAN POTASSIUM 100 MG PO TABS: 100.0000 mg | ORAL_TABLET | Freq: Every day | ORAL | 2 refills | Status: DC

## 2018-09-15 NOTE — Telephone Encounter (Signed)
Fax from [facility] is on back order. Please send new rx - one rx for Losartan and one of HCTZ. Thanks

## 2018-09-16 ENCOUNTER — Emergency Department (HOSPITAL_COMMUNITY): Payer: Self-pay

## 2018-09-16 ENCOUNTER — Inpatient Hospital Stay (HOSPITAL_COMMUNITY)
Admission: EM | Admit: 2018-09-16 | Discharge: 2018-09-26 | DRG: 228 | Disposition: A | Discharge status: Home / Self Care | Payer: Self-pay | Attending: Cardiothoracic Surgery | Admitting: Cardiothoracic Surgery

## 2018-09-16 ENCOUNTER — Inpatient Hospital Stay (HOSPITAL_COMMUNITY): Payer: Self-pay

## 2018-09-16 DIAGNOSIS — I82432 Acute embolism and thrombosis of left popliteal vein: Secondary | ICD-10-CM | POA: Diagnosis present

## 2018-09-16 DIAGNOSIS — I2699 Other pulmonary embolism without acute cor pulmonale: Secondary | ICD-10-CM

## 2018-09-16 DIAGNOSIS — Q613 Polycystic kidney, unspecified: Secondary | ICD-10-CM

## 2018-09-16 DIAGNOSIS — F17210 Nicotine dependence, cigarettes, uncomplicated: Secondary | ICD-10-CM | POA: Diagnosis present

## 2018-09-16 DIAGNOSIS — J440 Chronic obstructive pulmonary disease with acute lower respiratory infection: Secondary | ICD-10-CM | POA: Diagnosis present

## 2018-09-16 DIAGNOSIS — I8290 Acute embolism and thrombosis of unspecified vein: Secondary | ICD-10-CM

## 2018-09-16 DIAGNOSIS — Z419 Encounter for procedure for purposes other than remedying health state, unspecified: Secondary | ICD-10-CM

## 2018-09-16 DIAGNOSIS — I313 Pericardial effusion (noninflammatory): Secondary | ICD-10-CM | POA: Diagnosis present

## 2018-09-16 DIAGNOSIS — R55 Syncope and collapse: Secondary | ICD-10-CM

## 2018-09-16 DIAGNOSIS — D539 Nutritional anemia, unspecified: Secondary | ICD-10-CM | POA: Diagnosis present

## 2018-09-16 DIAGNOSIS — I5189 Other ill-defined heart diseases: Secondary | ICD-10-CM

## 2018-09-16 DIAGNOSIS — Z86718 Personal history of other venous thrombosis and embolism: Secondary | ICD-10-CM

## 2018-09-16 DIAGNOSIS — W6199XA Other contact with other birds, initial encounter: Secondary | ICD-10-CM

## 2018-09-16 DIAGNOSIS — I441 Atrioventricular block, second degree: Secondary | ICD-10-CM | POA: Diagnosis present

## 2018-09-16 DIAGNOSIS — I4891 Unspecified atrial fibrillation: Secondary | ICD-10-CM

## 2018-09-16 DIAGNOSIS — K7689 Other specified diseases of liver: Secondary | ICD-10-CM | POA: Diagnosis present

## 2018-09-16 DIAGNOSIS — E8770 Fluid overload, unspecified: Secondary | ICD-10-CM | POA: Diagnosis present

## 2018-09-16 DIAGNOSIS — Q2112 Patent foramen ovale: Secondary | ICD-10-CM

## 2018-09-16 DIAGNOSIS — J90 Pleural effusion, not elsewhere classified: Secondary | ICD-10-CM | POA: Diagnosis present

## 2018-09-16 DIAGNOSIS — J180 Bronchopneumonia, unspecified organism: Secondary | ICD-10-CM | POA: Diagnosis present

## 2018-09-16 DIAGNOSIS — Z720 Tobacco use: Secondary | ICD-10-CM

## 2018-09-16 DIAGNOSIS — J9811 Atelectasis: Secondary | ICD-10-CM | POA: Diagnosis present

## 2018-09-16 DIAGNOSIS — I471 Supraventricular tachycardia: Secondary | ICD-10-CM | POA: Diagnosis present

## 2018-09-16 DIAGNOSIS — Z888 Allergy status to other drugs, medicaments and biological substances status: Secondary | ICD-10-CM

## 2018-09-16 DIAGNOSIS — I129 Hypertensive chronic kidney disease with stage 1 through stage 4 chronic kidney disease, or unspecified chronic kidney disease: Secondary | ICD-10-CM | POA: Diagnosis present

## 2018-09-16 DIAGNOSIS — R0602 Shortness of breath: Secondary | ICD-10-CM

## 2018-09-16 DIAGNOSIS — Z825 Family history of asthma and other chronic lower respiratory diseases: Secondary | ICD-10-CM

## 2018-09-16 DIAGNOSIS — Z841 Family history of disorders of kidney and ureter: Secondary | ICD-10-CM

## 2018-09-16 DIAGNOSIS — Q211 Atrial septal defect: Secondary | ICD-10-CM

## 2018-09-16 DIAGNOSIS — I959 Hypotension, unspecified: Secondary | ICD-10-CM

## 2018-09-16 DIAGNOSIS — Z09 Encounter for follow-up examination after completed treatment for conditions other than malignant neoplasm: Secondary | ICD-10-CM

## 2018-09-16 DIAGNOSIS — Z79899 Other long term (current) drug therapy: Secondary | ICD-10-CM

## 2018-09-16 DIAGNOSIS — Z973 Presence of spectacles and contact lenses: Secondary | ICD-10-CM

## 2018-09-16 DIAGNOSIS — I513 Intracardiac thrombosis, not elsewhere classified: Principal | ICD-10-CM | POA: Diagnosis present

## 2018-09-16 DIAGNOSIS — Z86711 Personal history of pulmonary embolism: Secondary | ICD-10-CM

## 2018-09-16 DIAGNOSIS — I480 Paroxysmal atrial fibrillation: Secondary | ICD-10-CM

## 2018-09-16 DIAGNOSIS — N183 Chronic kidney disease, stage 3 (moderate): Secondary | ICD-10-CM | POA: Diagnosis present

## 2018-09-16 DIAGNOSIS — N179 Acute kidney failure, unspecified: Secondary | ICD-10-CM | POA: Diagnosis present

## 2018-09-16 DIAGNOSIS — R7989 Other specified abnormal findings of blood chemistry: Secondary | ICD-10-CM

## 2018-09-16 DIAGNOSIS — Z7901 Long term (current) use of anticoagulants: Secondary | ICD-10-CM

## 2018-09-16 DIAGNOSIS — D62 Acute posthemorrhagic anemia: Secondary | ICD-10-CM | POA: Diagnosis present

## 2018-09-16 LAB — CBC
HCT: 45.9 % (ref 39.0–52.0)
Hemoglobin: 14.6 g/dL (ref 13.0–17.0)
MCH: 33.9 pg (ref 26.0–34.0)
MCHC: 31.8 g/dL (ref 30.0–36.0)
MCV: 106.5 fL — AB (ref 80.0–100.0)
NRBC: 0 % (ref 0.0–0.2)
PLATELETS: 179 10*3/uL (ref 150–400)
RBC: 4.31 MIL/uL (ref 4.22–5.81)
RDW: 13.1 % (ref 11.5–15.5)
WBC: 5.8 10*3/uL (ref 4.0–10.5)

## 2018-09-16 LAB — BASIC METABOLIC PANEL
ANION GAP: 9 (ref 5–15)
BUN: 18 mg/dL (ref 6–20)
CALCIUM: 8.3 mg/dL — AB (ref 8.9–10.3)
CO2: 21 mmol/L — AB (ref 22–32)
Chloride: 109 mmol/L (ref 98–111)
Creatinine, Ser: 2.1 mg/dL — ABNORMAL HIGH (ref 0.61–1.24)
GFR, EST AFRICAN AMERICAN: 43 mL/min — AB (ref >60)
GFR, EST NON AFRICAN AMERICAN: 37 mL/min — AB (ref >60)
Glucose, Bld: 114 mg/dL — ABNORMAL HIGH (ref 70–99)
Potassium: 3.7 mmol/L (ref 3.5–5.1)
SODIUM: 139 mmol/L (ref 135–145)

## 2018-09-16 LAB — I-STAT TROPONIN, ED: TROPONIN I, POC: 0.2 ng/mL — AB (ref 0.00–0.08)

## 2018-09-16 LAB — URINALYSIS, ROUTINE W REFLEX MICROSCOPIC
Bacteria, UA: NONE SEEN
Bilirubin Urine: NEGATIVE
GLUCOSE, UA: NEGATIVE mg/dL
Ketones, ur: NEGATIVE mg/dL
LEUKOCYTES UA: NEGATIVE
Nitrite: NEGATIVE
PH: 5 (ref 5.0–8.0)
Protein, ur: 100 mg/dL — AB
SPECIFIC GRAVITY, URINE: 1.014 (ref 1.005–1.030)

## 2018-09-16 LAB — GLUCOSE, CAPILLARY: GLUCOSE-CAPILLARY: 101 mg/dL — AB (ref 70–99)

## 2018-09-16 LAB — HEPARIN LEVEL (UNFRACTIONATED): HEPARIN UNFRACTIONATED: 0.47 [IU]/mL (ref 0.30–0.70)

## 2018-09-16 LAB — ECHOCARDIOGRAM COMPLETE: WEIGHTICAEL: 2726.65 [oz_av]

## 2018-09-16 LAB — D-DIMER, QUANTITATIVE (NOT AT ARMC)

## 2018-09-16 LAB — RAPID URINE DRUG SCREEN, HOSP PERFORMED
Amphetamines: NOT DETECTED
Barbiturates: NOT DETECTED
Benzodiazepines: NOT DETECTED
Cocaine: NOT DETECTED
OPIATES: NOT DETECTED
Tetrahydrocannabinol: NOT DETECTED

## 2018-09-16 MED ORDER — FENTANYL CITRATE (PF) 100 MCG/2ML IJ SOLN
50.0000 ug | Freq: Once | INTRAMUSCULAR | Status: AC
  Administered 2018-09-16: 50 ug via INTRAVENOUS

## 2018-09-16 MED ORDER — HEPARIN (PORCINE) 25000 UT/250ML-% IV SOLN
1200.0000 [IU]/h | INTRAVENOUS | Status: DC
  Administered 2018-09-16 – 2018-09-17 (×2): 1100 [IU]/h via INTRAVENOUS
  Administered 2018-09-18: 1200 [IU]/h via INTRAVENOUS
  Filled 2018-09-16 (×3): qty 250

## 2018-09-16 MED ORDER — HEPARIN BOLUS VIA INFUSION
4500.0000 [IU] | Freq: Once | INTRAVENOUS | Status: AC
  Administered 2018-09-16: 4500 [IU] via INTRAVENOUS
  Filled 2018-09-16: qty 4500

## 2018-09-16 MED ORDER — ALBUTEROL SULFATE (2.5 MG/3ML) 0.083% IN NEBU
5.0000 mg | INHALATION_SOLUTION | Freq: Once | RESPIRATORY_TRACT | Status: AC
  Administered 2018-09-16: 5 mg via RESPIRATORY_TRACT
  Filled 2018-09-16: qty 6

## 2018-09-16 MED ORDER — TECHNETIUM TC 99M DIETHYLENETRIAME-PENTAACETIC ACID
31.0000 | Freq: Once | INTRAVENOUS | Status: AC | PRN
  Administered 2018-09-16: 31 via RESPIRATORY_TRACT

## 2018-09-16 MED ORDER — DILTIAZEM LOAD VIA INFUSION
15.0000 mg | Freq: Once | INTRAVENOUS | Status: DC
  Filled 2018-09-16: qty 15

## 2018-09-16 MED ORDER — TECHNETIUM TO 99M ALBUMIN AGGREGATED
4.2000 | Freq: Once | INTRAVENOUS | Status: AC | PRN
  Administered 2018-09-16: 4.2 via INTRAVENOUS

## 2018-09-16 MED ORDER — SODIUM CHLORIDE 0.9 % IV SOLN
1.0000 g | Freq: Once | INTRAVENOUS | Status: AC
  Administered 2018-09-16: 1 g via INTRAVENOUS
  Filled 2018-09-16 (×3): qty 10

## 2018-09-16 MED ORDER — FENTANYL CITRATE (PF) 100 MCG/2ML IJ SOLN
INTRAMUSCULAR | Status: AC
  Filled 2018-09-16: qty 2

## 2018-09-16 MED ORDER — ETOMIDATE 2 MG/ML IV SOLN
10.0000 mg | Freq: Once | INTRAVENOUS | Status: AC
  Administered 2018-09-16: 10 mg via INTRAVENOUS

## 2018-09-16 MED ORDER — ADENOSINE 6 MG/2ML IV SOLN
INTRAVENOUS | Status: AC
  Filled 2018-09-16: qty 4

## 2018-09-16 MED ORDER — SODIUM CHLORIDE 0.9 % IV BOLUS
1000.0000 mL | Freq: Once | INTRAVENOUS | Status: AC
  Administered 2018-09-16: 1000 mL via INTRAVENOUS

## 2018-09-16 MED ORDER — IPRATROPIUM BROMIDE 0.02 % IN SOLN
0.5000 mg | Freq: Once | RESPIRATORY_TRACT | Status: AC
  Administered 2018-09-16: 0.5 mg via RESPIRATORY_TRACT
  Filled 2018-09-16: qty 2.5

## 2018-09-16 MED ORDER — DILTIAZEM HCL-DEXTROSE 100-5 MG/100ML-% IV SOLN (PREMIX)
5.0000 mg/h | INTRAVENOUS | Status: DC
  Administered 2018-09-16: 5 mg/h via INTRAVENOUS
  Administered 2018-09-17: 15 mg/h via INTRAVENOUS
  Administered 2018-09-17 – 2018-09-18 (×2): 5 mg/h via INTRAVENOUS
  Filled 2018-09-16 (×6): qty 100

## 2018-09-16 MED FILL — LOSARTAN POTASSIUM 100 MG T: 100 | 30 days supply | Qty: 30 | Fill #0

## 2018-09-16 MED FILL — HYDROCHLOROTHIAZIDE 25 MG T: 25 | 30 days supply | Qty: 30 | Fill #0

## 2018-09-16 NOTE — ED Provider Notes (Addendum)
Nurse approached me as no admit orders as of yet.   I reviewed chart - it appears cardiology has seen, left consult note, and requested medical service admission.  Pt/fam indicate pcp is [facility] - paged resident on call to admit.  Pt does have bil wheezing, hx asthma. Will order neb tx.  Given syncope with new rapid afib, will add ddimer to labs - discussed w admitted resident on call, and that if elev, will need study (likely VQ given elevated cr) - resident indicates they will f/u on and get pt admitted.        Lajean Saver, MD 09/16/18 1740

## 2018-09-16 NOTE — ED Notes (Signed)
Pt placed on zoll, NS bolus infusing.

## 2018-09-16 NOTE — ED Notes (Signed)
Pt back into fast heart rate- [place] at bedside- 5mg  etomidate IV and re shocked synced at 120J.

## 2018-09-16 NOTE — ED Notes (Signed)
Preparing to cardioverter pt emergently due to heart rate and BP. Pharmacy  pulling medic ation

## 2018-09-16 NOTE — Progress Notes (Signed)
I followed up on his VQ scanWhich was highly suggestive of multiple pulmonary embolism.  I came to assess the patient patient is on room air with stable blood pressure denies any chest pain denies any dizziness or syncope.  Patient is on heparin drip.  We discussed with the patient about the options of continuing on heparin drip or giving TPA and explained all the pros and cons and potentials of both including worsening hemodynamics bleeding and death.  Patient elected to continue on heparin drip and not get the TPA understanding the risks were both decisions and was asked to verbalize it again.  Bedside nurse was present during the discussion.  Discussed CODE STATUS.  Patient is full code.

## 2018-09-16 NOTE — Progress Notes (Signed)
Nebo for heparin Indication: atrial fibrillation  Allergies  Allergen Reactions  . Lisinopril Swelling    Lip swelling    Patient Measurements: Weight: 170 lb 6.7 oz (77.3 kg) Heparin Dosing Weight: 77kg  Vital Signs: BP: 91/73 (11/27 1500) Pulse Rate: 101 (11/27 1400)  Labs: Recent Labs    09/16/18 1047 09/16/18 1812  HGB 14.6  --   HCT 45.9  --   PLT 179  --   HEPARINUNFRC  --  0.47  CREATININE 2.10*  --     Estimated Creatinine Clearance: 44.4 mL/min (A) (by C-G formula based on SCr of 2.1 mg/dL (H)).  Assessment: 41 YOM presenting with afib continues on IV heparin. First heparin level is therapeutic. Of note, d-dimer is significantly elevated and VQ scan is pending. No bleeding noted.       Goal of Therapy:  Heparin level 0.3-0.7 units/ml Monitor platelets by anticoagulation protocol: Yes   Plan:  Continue heparin gtt 1100 units/hr Confirm dosing with AM heparin level Daily heparin level and CBC F/u VQ  Salome Arnt, PharmD, BCPS Please see AMION for all pharmacy numbers 09/16/2018 7:52 PM

## 2018-09-16 NOTE — H&P (View-Only) (Signed)
Cardiology Consultation:   Patient ID: [name] MRN: [id number]; DOB: Mar 11, 1971  Admit date: 09/16/2018 Date of Consult: 09/16/2018  Primary Care Provider: Delice Bison, DO Primary Cardiologist:[facility] to [facility]   Patient Profile:   [name] is a 47 y.o. male with a hx of hypertension, tobacco abuse, asthma and CKD who is being seen today for the evaluation of syncope at the request of Dr. Dayna Barker.   History of Present Illness:   Mr. Saltzman had a syncope episode at pharmacy while picking up his medication.  EMS was called and found hypotensive, he stood up and had another episode of syncope.  He was tachycardic at 150s.  Upon arrival he found in atrial fibrillation/SVT.  He had cardioversion x2 in ER.  He was giving a liter of normal saline with minimal improvement of blood pressure.  Due to persistent tachycardia he was started on IV Cardizem and heparin prophylactically.  Currently in sinus rhythm at rate of 90-100s.  States that he did feel dizziness and palpitation prior to syncope episode.  May have chest pain but no shortness of breath.  Patient smokes 1 pack of cigarette per day for past 12 hours.  Denies illicit drug use.  Patient did had syncope episode at age 37 when he banded and trying to move up.  No evaluation.  No family history of CAD, stroke or premature cardiac death.  Denies orthopnea, PND, lower extremity edema, melena, weakness on one side of body.  Past Medical History:  Diagnosis Date  . Asthma   . Chronic kidney disease   . Eczema herpeticum   . Erythroderma   . Erythroderma   . Hypertension   . Wears glasses     Past Surgical History:  Procedure Laterality Date  . EYE SURGERY  1976  . HERNIA REPAIR       Inpatient Medications: Scheduled Meds: . adenosine      . diltiazem  15 mg Intravenous Once   Continuous Infusions: . calcium gluconate 1 GM IVPB    . diltiazem (CARDIZEM) infusion 15 mg/hr (09/16/18 1219)  . heparin 1,100  Units/hr (09/16/18 1248)   PRN Meds:   Allergies:    Allergies  Allergen Reactions  . Lisinopril Swelling    Lip swelling    Social History:   Social History   Socioeconomic History  . Marital status: Married    Spouse name: Not on file  . Number of children: Not on file  . Years of education: Not on file  . Highest education level: Not on file  Occupational History  . Not on file  Social Needs  . Financial resource strain: Not on file  . Food insecurity:    Worry: Not on file    Inability: Not on file  . Transportation needs:    Medical: Not on file    Non-medical: Not on file  Tobacco Use  . Smoking status: Current Every Day Smoker    Packs/day: 1.00  . Smokeless tobacco: Never Used  Substance and Sexual Activity  . Alcohol use: Yes    Comment: OCCASIONALLY  . Drug use: No  . Sexual activity: Yes    Birth control/protection: Condom  Lifestyle  . Physical activity:    Days per week: Not on file    Minutes per session: Not on file  . Stress: Not on file  Relationships  . Social connections:    Talks on phone: Not on file    Gets together: Not on file  Attends religious service: Not on file    Active member of club or organization: Not on file    Attends meetings of clubs or organizations: Not on file    Relationship status: Not on file  . Intimate partner violence:    Fear of current or ex partner: Not on file    Emotionally abused: Not on file    Physically abused: Not on file    Forced sexual activity: Not on file  Other Topics Concern  . Not on file  Social History Narrative  . Not on file    Family History:   Family History  Problem Relation Age of Onset  . Asthma Father   . Kidney disease Father   . Eczema Sister      ROS:  Please see the history of present illness.  All other ROS reviewed and negative.     Physical Exam/Data:   Vitals:   09/16/18 1230 09/16/18 1300 09/16/18 1400 09/16/18 1500  BP: 99/78 105/87 93/79 91/73     Pulse:   (!) 101   Resp: 17 19 19 18   SpO2:   99%   Weight:        Intake/Output Summary (Last 24 hours) at 09/16/2018 1528 Last data filed at 09/16/2018 1234 Gross per 24 hour  Intake 1000 ml  Output -  Net 1000 ml   Filed Weights   09/16/18 1143  Weight: 77.3 kg   Body mass index is 25.17 kg/m.  General:  Well nourished, well developed, in no acute distress HEENT: normal Lymph: no adenopathy Neck: no JVD Endocrine:  No thryomegaly Vascular: No carotid bruits; FA pulses 2+ bilaterally without bruits  Cardiac:  normal S1, S2; RRR; no murmur  Lungs:  clear to auscultation bilaterally, no wheezing, rhonchi or rales  Abd: soft, nontender, no hepatomegaly  Ext: no edema Musculoskeletal:  No deformities, BUE and BLE strength normal and equal Skin: warm and dry  Neuro:  CNs 2-12 intact, no focal abnormalities noted Psych:  Normal affect   EKG:  The EKG was personally reviewed and demonstrates: Tachycardia at rate of 168 bpm  Relevant CV Studies: As above  Laboratory Data:  Chemistry Recent Labs  Lab 09/16/18 1047  NA 139  K 3.7  CL 109  CO2 21*  GLUCOSE 114*  BUN 18  CREATININE 2.10*  CALCIUM 8.3*  GFRNONAA 37*  GFRAA 43*  ANIONGAP 9    No results for input(s): PROT, ALBUMIN, AST, ALT, ALKPHOS, BILITOT in the last 168 hours. Hematology Recent Labs  Lab 09/16/18 1047  WBC 5.8  RBC 4.31  HGB 14.6  HCT 45.9  MCV 106.5*  MCH 33.9  MCHC 31.8  RDW 13.1  PLT 179   Cardiac EnzymesNo results for input(s): TROPONINI in the last 168 hours.  Recent Labs  Lab 09/16/18 1058  TROPIPOC 0.20*     Radiology/Studies:  Dg Chest 2 View  Result Date: 09/16/2018 CLINICAL DATA:  Recurrent syncope today. Tachycardia. History of hypertension. EXAM: CHEST - 2 VIEW COMPARISON:  Chest radiograph January 14, 2011 FINDINGS: Cardiac silhouette is upper limits of normal size, mediastinal silhouette is unremarkable. No pleural effusion or focal consolidation. No  pneumothorax. Soft tissue planes and included osseous structures are non suspicious. IMPRESSION: Borderline cardiomegaly.  No acute pulmonary process. Electronically Signed   By: Elon Alas M.D.   On: 09/16/2018 14:29    Assessment and Plan:   1.  Syncope Prodromal symptoms of dizziness and palpitation with questionable chest pain without  shortness of breath.  Differential includes arrhythmia, PE versus orthostatic.  Continue to monitor on telemetry.  Check TSH. Stat Echocardiogram.  Consider VQ scan. Check UDS.   2.  Tachycardia -Looks  A. Fib on tele at 160s.  Required cardioversion x2 in ER.  Now maintaining sinus rhythm at rate of 90-100s.  Continue IV Cardizem and heparin for now. CHADSVASC score of 1 for HTN.  3.  Acute on chronic kidney disease stage III -Baseline creatinine around 1.4-1.6.  Creatinine here 2.1.  Avoid nephrotoxic agent.  4.  Hypertension -Hold home losartan/hydrochlorothiazide given repeated renal function and hypotension.  5. Elevated troponin - likely duet to tachycardia and shock x 2. Cycle troponin. ST depression in lateal lead at elevated rate of 160s.    IM/teaching service to admit.  For questions or updates, please contact [place] Please consult [email or web address] for contact info under     Jarrett Soho, PA  09/16/2018 3:28 PM   I have examined the patient and reviewed assessment and plan and discussed with patient.  Agree with above as stated.     New onset AFib.  History concerning for PE, given AFib and syncope in a young person.  Check stat echo to look at RV.  Elevated troponin as well.  Multiple RF for CAD including tobacco abuse and HTN.  Acute kidney injury.  Will have to see how kidney function does before considering ischemic w/u. I agree with continuing heparin gtt at this time.   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  PE is confirmed by this study.  I discussed the case  with IR, and no invasive therapies are available from their standpoint.  COntinue heparin.  WIll discuss with ICU attending.  Jettie Booze, MD

## 2018-09-16 NOTE — ED Notes (Signed)
Pt given sedation medication emergently with verbal consent and pt received 1 synced shock at 120J. Pt went into sinus tach.

## 2018-09-16 NOTE — ED Notes (Signed)
Report given to Lucia Estelle RN

## 2018-09-16 NOTE — Consult Note (Signed)
[name], MRN:  [id number], DOB:  1970-12-24, LOS: 0 ADMISSION DATE:  09/16/2018, CONSULTATION DATE:  09/16/2018 REFERRING MD:  Cardiology MD - Irish Lack, CHIEF COMPLAINT:  Mobile RA clot   Brief History   47 year old male with PMH of HTN and polycystic kidney who presents to [facility] via cardiology with the chief complaint of syncope.  Patient was picking up hi smedications from the pharmacy when he had a syncopal episode.  EMS was called and the patient was noted to be hypotensive at the time and tachycardic.  Patient was brought to the ED and hypotension resolved with IVF and no O2 demand was noted.  The patient seems rather irritated and did not really answer questions.  Rest of the history is from the chart.  History of present illness   47 year old male with PMH of HTN and polycystic kidney who presents to [facility] via cardiology with the chief complaint of syncope.  Patient was picking up hi smedications from the pharmacy when he had a syncopal episode.  EMS was called and the patient was noted to be hypotensive at the time and tachycardic.  Patient was brought to the ED and hypotension resolved with IVF and no O2 demand was noted.  The patient seems rather irritated and did not really answer questions.  Rest of the history is from the chart.  Past Medical History  HTN PCK Asthma  Significant Hospital Events   N/A  Consults:  PCCM  Procedures:  2D echo that I reviewed myself with RA clot moving in and out of the RV.  Reviewed with cardiology.  Significant Diagnostic Tests:  2D echo that I reviewed myself with RA clot moving in and out of the RV.  Reviewed with cardiology.  Micro Data:  N/A  Antimicrobials:  N/A   Interim history/subjective:  Patient complaining of wanting food and is fighting with his brother about decision making process as below.  No complaints.  Objective   Blood pressure 91/73, pulse (!) 101, resp. rate 18, weight 77.3 kg, SpO2 96 %.         Intake/Output Summary (Last 24 hours) at 09/16/2018 1852 Last data filed at 09/16/2018 1234 Gross per 24 hour  Intake 1000 ml  Output -  Net 1000 ml   Filed Weights   09/16/18 1143  Weight: 77.3 kg    Examination: General: Well appearing, NAD HENT: [place]/AT, PERRL, EOM-I and MMM Lungs: CTA bilaterally Cardiovascular: HR is 95 and regular, Nl S1/S2 and -M/R/G Abdomen: Soft, NT, ND and +BS Extremities: -edema and -tenderness Neuro: -edema and -tenderness Skin: Intact  I reviewed CXR myself, no acute disease  Resolved Hospital Problem list   N/A  Assessment & Plan:  47 year old male with PMH of HTN and asthma presenting with blood clot in the RA mobile in and out of the RV and history is consistent with PE but no diagnostic tests were done.  Discussed with cardiology and IR.  The patient is stable from hemodynamics and respiratory standpoint.  We need to have an in detail discussion about options of lytics vs anti-coagulation alone at this time.  His brother however is bedside and is insisting that the family have a discussion about that.  I offered him lytics and informed him that he has a 1% chance of ICH and a chance of GI bleeding and could be fatal.  I also offered him anti-coagulation and informed him that a dislodge of that clot could  be very dangerous if not dissolved but unfortunately no data is available on that.  He elected to proceed with lytics initially but then with the insistent of the brother they are reconsidering.  Clearly they have a decision to make.  I ordered a V/Q scan for him and will transfer him to the ICU while wait for the VQ scan and further conversations to be taken on by the night physician taking over for me.  This information will be shared with him.  Dr. Jeronimo Norma with radiology will expedite the VQ scan.  If VQ scan is strongly positive I would imagine lytics would be the appropriate course of action.  If no then will need further discussion.  Labs    CBC: Recent Labs  Lab 09/16/18 1047  WBC 5.8  HGB 14.6  HCT 45.9  MCV 106.5*  PLT 790    Basic Metabolic Panel: Recent Labs  Lab 09/16/18 1047  NA 139  K 3.7  CL 109  CO2 21*  GLUCOSE 114*  BUN 18  CREATININE 2.10*  CALCIUM 8.3*   GFR: Estimated Creatinine Clearance: 44.4 mL/min (A) (by C-G formula based on SCr of 2.1 mg/dL (H)). Recent Labs  Lab 09/16/18 1047  WBC 5.8    Liver Function Tests: No results for input(s): AST, ALT, ALKPHOS, BILITOT, PROT, ALBUMIN in the last 168 hours. No results for input(s): LIPASE, AMYLASE in the last 168 hours. No results for input(s): AMMONIA in the last 168 hours.  ABG    Component Value Date/Time   TCO2 32 12/25/2012 1735     Coagulation Profile: No results for input(s): INR, PROTIME in the last 168 hours.  Cardiac Enzymes: No results for input(s): CKTOTAL, CKMB, CKMBINDEX, TROPONINI in the last 168 hours.  HbA1C: No results found for: HGBA1C  CBG: No results for input(s): GLUCAP in the last 168 hours.  Review of Systems:   12 point ROS negative other than above.  Past Medical History  He,  has a past medical history of Asthma, Chronic kidney disease, Eczema herpeticum, Erythroderma, Erythroderma, Hypertension, and Wears glasses.   Surgical History    Past Surgical History:  Procedure Laterality Date  . EYE SURGERY  1976  . HERNIA REPAIR       Social History   reports that he has been smoking. He has been smoking about 1.00 pack per day. He has never used smokeless tobacco. He reports that he drinks alcohol. He reports that he does not use drugs.   Family History   His family history includes Asthma in his father; Eczema in his sister; Kidney disease in his father.   Allergies Allergies  Allergen Reactions  . Lisinopril Swelling    Lip swelling     Home Medications  Prior to Admission medications   Medication Sig Start Date End Date Taking? Authorizing Provider  albuterol (PROVENTIL  HFA;VENTOLIN HFA) 108 (90 BASE) MCG/ACT inhaler Inhale 1-2 puffs into the lungs every 6 (six) hours as needed for wheezing or shortness of breath. 12/22/12  Yes Kindl, Nelda Severe, MD  Dupilumab (DUPIXENT [place]) Inject 1 Syringe into the skin every 14 (fourteen) days.   Yes [provider]  losartan-hydrochlorothiazide (HYZAAR) 100-25 MG tablet Take 1 tablet by mouth daily. 05/06/18  Yes Katherine Roan, MD    The patient is critically ill with multiple organ systems failure and requires high complexity decision making for assessment and support, frequent evaluation and titration of therapies, application of advanced monitoring technologies and extensive interpretation  of multiple databases.   Critical Care Time devoted to patient care services described in this note is  45  Minutes. This time reflects time of care of this signee Dr Jennet Maduro. This critical care time does not reflect procedure time, or teaching time or supervisory time of PA/NP/Med student/Med Resident etc but could involve care discussion time.  Rush Farmer, M.D. [facility] Pulmonary/Critical Care Medicine. Pager: [phone]. After hours pager: [phone].

## 2018-09-16 NOTE — ED Notes (Signed)
Pharmacist verbally notified of missing diltiazem and CG. EDP aware.

## 2018-09-16 NOTE — ED Notes (Signed)
12 mg adenosine pushed- went down for a moment then back into fast heart rate.

## 2018-09-16 NOTE — ED Notes (Signed)
Pt asked for his cell phone. Alert and oriented at this time.

## 2018-09-16 NOTE — Progress Notes (Signed)
RT called to bedside to standby for cardioversion.  Pt was placed on [place] for procedure, no other RT interventions needed.

## 2018-09-16 NOTE — Consult Note (Addendum)
Cardiology Consultation:   Patient ID: [name] MRN: [id number]; DOB: 16-Feb-1971  Admit date: 09/16/2018 Date of Consult: 09/16/2018  Primary Care Provider: Delice Bison, DO Primary Cardiologist:[facility] to [facility]   Patient Profile:   [name] is a 47 y.o. male with a hx of hypertension, tobacco abuse, asthma and CKD who is being seen today for the evaluation of syncope at the request of Dr. Dayna Barker.   History of Present Illness:   [name] had a syncope episode at pharmacy while picking up his medication.  EMS was called and found hypotensive, he stood up and had another episode of syncope.  He was tachycardic at 150s.  Upon arrival he found in atrial fibrillation/SVT.  He had cardioversion x2 in ER.  He was giving a liter of normal saline with minimal improvement of blood pressure.  Due to persistent tachycardia he was started on IV Cardizem and heparin prophylactically.  Currently in sinus rhythm at rate of 90-100s.  States that he did feel dizziness and palpitation prior to syncope episode.  May have chest pain but no shortness of breath.  Patient smokes 1 pack of cigarette per day for past 12 hours.  Denies illicit drug use.  Patient did had syncope episode at age 44 when he banded and trying to move up.  No evaluation.  No family history of CAD, stroke or premature cardiac death.  Denies orthopnea, PND, lower extremity edema, melena, weakness on one side of body.  Past Medical History:  Diagnosis Date  . Asthma   . Chronic kidney disease   . Eczema herpeticum   . Erythroderma   . Erythroderma   . Hypertension   . Wears glasses     Past Surgical History:  Procedure Laterality Date  . EYE SURGERY  1976  . HERNIA REPAIR       Inpatient Medications: Scheduled Meds: . adenosine      . diltiazem  15 mg Intravenous Once   Continuous Infusions: . calcium gluconate 1 GM IVPB    . diltiazem (CARDIZEM) infusion 15 mg/hr (09/16/18 1219)  . heparin 1,100  Units/hr (09/16/18 1248)   PRN Meds:   Allergies:    Allergies  Allergen Reactions  . Lisinopril Swelling    Lip swelling    Social History:   Social History   Socioeconomic History  . Marital status: Married    Spouse name: Not on file  . Number of children: Not on file  . Years of education: Not on file  . Highest education level: Not on file  Occupational History  . Not on file  Social Needs  . Financial resource strain: Not on file  . Food insecurity:    Worry: Not on file    Inability: Not on file  . Transportation needs:    Medical: Not on file    Non-medical: Not on file  Tobacco Use  . Smoking status: Current Every Day Smoker    Packs/day: 1.00  . Smokeless tobacco: Never Used  Substance and Sexual Activity  . Alcohol use: Yes    Comment: OCCASIONALLY  . Drug use: No  . Sexual activity: Yes    Birth control/protection: Condom  Lifestyle  . Physical activity:    Days per week: Not on file    Minutes per session: Not on file  . Stress: Not on file  Relationships  . Social connections:    Talks on phone: Not on file    Gets together: Not on file  Attends religious service: Not on file    Active member of club or organization: Not on file    Attends meetings of clubs or organizations: Not on file    Relationship status: Not on file  . Intimate partner violence:    Fear of current or ex partner: Not on file    Emotionally abused: Not on file    Physically abused: Not on file    Forced sexual activity: Not on file  Other Topics Concern  . Not on file  Social History Narrative  . Not on file    Family History:   Family History  Problem Relation Age of Onset  . Asthma Father   . Kidney disease Father   . Eczema Sister      ROS:  Please see the history of present illness.  All other ROS reviewed and negative.     Physical Exam/Data:   Vitals:   09/16/18 1230 09/16/18 1300 09/16/18 1400 09/16/18 1500  BP: 99/78 105/87 93/79 91/73     Pulse:   (!) 101   Resp: 17 19 19 18   SpO2:   99%   Weight:        Intake/Output Summary (Last 24 hours) at 09/16/2018 1528 Last data filed at 09/16/2018 1234 Gross per 24 hour  Intake 1000 ml  Output -  Net 1000 ml   Filed Weights   09/16/18 1143  Weight: 77.3 kg   Body mass index is 25.17 kg/m.  General:  Well nourished, well developed, in no acute distress HEENT: normal Lymph: no adenopathy Neck: no JVD Endocrine:  No thryomegaly Vascular: No carotid bruits; FA pulses 2+ bilaterally without bruits  Cardiac:  normal S1, S2; RRR; no murmur  Lungs:  clear to auscultation bilaterally, no wheezing, rhonchi or rales  Abd: soft, nontender, no hepatomegaly  Ext: no edema Musculoskeletal:  No deformities, BUE and BLE strength normal and equal Skin: warm and dry  Neuro:  CNs 2-12 intact, no focal abnormalities noted Psych:  Normal affect   EKG:  The EKG was personally reviewed and demonstrates: Tachycardia at rate of 168 bpm  Relevant CV Studies: As above  Laboratory Data:  Chemistry Recent Labs  Lab 09/16/18 1047  NA 139  K 3.7  CL 109  CO2 21*  GLUCOSE 114*  BUN 18  CREATININE 2.10*  CALCIUM 8.3*  GFRNONAA 37*  GFRAA 43*  ANIONGAP 9    No results for input(s): PROT, ALBUMIN, AST, ALT, ALKPHOS, BILITOT in the last 168 hours. Hematology Recent Labs  Lab 09/16/18 1047  WBC 5.8  RBC 4.31  HGB 14.6  HCT 45.9  MCV 106.5*  MCH 33.9  MCHC 31.8  RDW 13.1  PLT 179   Cardiac EnzymesNo results for input(s): TROPONINI in the last 168 hours.  Recent Labs  Lab 09/16/18 1058  TROPIPOC 0.20*     Radiology/Studies:  Dg Chest 2 View  Result Date: 09/16/2018 CLINICAL DATA:  Recurrent syncope today. Tachycardia. History of hypertension. EXAM: CHEST - 2 VIEW COMPARISON:  Chest radiograph January 14, 2011 FINDINGS: Cardiac silhouette is upper limits of normal size, mediastinal silhouette is unremarkable. No pleural effusion or focal consolidation. No  pneumothorax. Soft tissue planes and included osseous structures are non suspicious. IMPRESSION: Borderline cardiomegaly.  No acute pulmonary process. Electronically Signed   By: Elon Alas M.D.   On: 09/16/2018 14:29    Assessment and Plan:   1.  Syncope Prodromal symptoms of dizziness and palpitation with questionable chest pain without  shortness of breath.  Differential includes arrhythmia, PE versus orthostatic.  Continue to monitor on telemetry.  Check TSH. Stat Echocardiogram.  Consider VQ scan. Check UDS.   2.  Tachycardia -Looks  A. Fib on tele at 160s.  Required cardioversion x2 in ER.  Now maintaining sinus rhythm at rate of 90-100s.  Continue IV Cardizem and heparin for now. CHADSVASC score of 1 for HTN.  3.  Acute on chronic kidney disease stage III -Baseline creatinine around 1.4-1.6.  Creatinine here 2.1.  Avoid nephrotoxic agent.  4.  Hypertension -Hold home losartan/hydrochlorothiazide given repeated renal function and hypotension.  5. Elevated troponin - likely duet to tachycardia and shock x 2. Cycle troponin. ST depression in lateal lead at elevated rate of 160s.    IM/teaching service to admit.  For questions or updates, please contact [place] Please consult [email or web address] for contact info under     Jarrett Soho, PA  09/16/2018 3:28 PM   I have examined the patient and reviewed assessment and plan and discussed with patient.  Agree with above as stated.     New onset AFib.  History concerning for PE, given AFib and syncope in a young person.  Check stat echo to look at RV.  Elevated troponin as well.  Multiple RF for CAD including tobacco abuse and HTN.  Acute kidney injury.  Will have to see how kidney function does before considering ischemic w/u. I agree with continuing heparin gtt at this time.   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  PE is confirmed by this study.  I discussed the case  with IR, and no invasive therapies are available from their standpoint.  COntinue heparin.  WIll discuss with ICU attending.  Jettie Booze, MD

## 2018-09-16 NOTE — ED Triage Notes (Signed)
Pt had syncope event while at pharmacy , ems arrived and sat pt up and had another syncope event. Pt arrives to ED alert and ox4- tachycardic 156 BP 76/54

## 2018-09-16 NOTE — Progress Notes (Signed)
  Echocardiogram 2D Echocardiogram has been performed.  Arlan Birks G Joretta Eads 09/16/2018, 5:40 PM

## 2018-09-16 NOTE — ED Provider Notes (Signed)
[place] EMERGENCY DEPARTMENT Provider Note   CSN: [id number] Arrival date & time: 09/16/18  1029     History   Chief Complaint Chief Complaint  Patient presents with  . Tachycardia  . Loss of Consciousness    HPI [name] is a 47 y.o. male.  Patient was a symptomatically in line to get medicines at the pharmacy when he also and syncopized.  EMS was called on arrival he was in atrial for ablation with rapid ventricular response and hypotensive.  Patient was alert by that point and refused transportation however when he stood up he syncopized again that he agreed to be transported.  At this time patient still feels dizzy but no other symptoms.  No history of atrial fibrillation.  No history of passing out.  No recent illnesses.  Does have some mild chest pain.    Loss of Consciousness   This is a new problem. The current episode started 1 to 2 hours ago. The problem occurs constantly. The problem has been resolved. He lost consciousness for a period of less than one minute. The problem is associated with normal activity.    Past Medical History:  Diagnosis Date  . Asthma   . Chronic kidney disease   . Eczema herpeticum   . Erythroderma   . Erythroderma   . Hypertension   . Wears glasses     Patient Active Problem List   Diagnosis Date Noted  . Polycystic kidney disease 04/22/2018  . Eczema herpeticum 04/22/2018  . Essential hypertension 04/22/2018  . Tonsillar abscess 12/25/2012    Class: Acute    Past Surgical History:  Procedure Laterality Date  . EYE SURGERY  1976  . HERNIA REPAIR          Home Medications    Prior to Admission medications   Medication Sig Start Date End Date Taking? Authorizing Provider  albuterol (PROVENTIL HFA;VENTOLIN HFA) 108 (90 BASE) MCG/ACT inhaler Inhale 1-2 puffs into the lungs every 6 (six) hours as needed for wheezing or shortness of breath. 12/22/12  Yes Kindl, Nelda Severe, MD  Dupilumab (DUPIXENT [place])  Inject 1 Syringe into the skin every 14 (fourteen) days.   Yes [provider]  losartan-hydrochlorothiazide (HYZAAR) 100-25 MG tablet Take 1 tablet by mouth daily. 05/06/18  Yes Katherine Roan, MD    Family History Family History  Problem Relation Age of Onset  . Asthma Father   . Kidney disease Father   . Eczema Sister     Social History Social History   Tobacco Use  . Smoking status: Current Every Day Smoker    Packs/day: 1.00  . Smokeless tobacco: Never Used  Substance Use Topics  . Alcohol use: Yes    Comment: OCCASIONALLY  . Drug use: No     Allergies   Lisinopril   Review of Systems Review of Systems  Cardiovascular: Positive for syncope.  All other systems reviewed and are negative.    Physical Exam Updated Vital Signs BP 91/73   Pulse (!) 101   Resp 18   Wt 77.3 kg   SpO2 99%   BMI 25.17 kg/m   Physical Exam  Constitutional: He appears well-developed and well-nourished.  HENT:  Head: Normocephalic and atraumatic.  Neck: Normal range of motion.  Cardiovascular: An irregularly irregular rhythm present. Tachycardia present.  Pulmonary/Chest: Effort normal. Tachypnea noted. No respiratory distress.  Abdominal: He exhibits no distension.  Musculoskeletal: Normal range of motion.  Neurological: He is alert.  Nursing note and vitals reviewed.    ED Treatments / Results  Labs (all labs ordered are listed, but only abnormal results are displayed) Labs Reviewed  BASIC METABOLIC PANEL - Abnormal; Notable for the following components:      Result Value   CO2 21 (*)    Glucose, Bld 114 (*)    Creatinine, Ser 2.10 (*)    Calcium 8.3 (*)    GFR calc non Af Amer 37 (*)    GFR calc Af Amer 43 (*)    All other components within normal limits  CBC - Abnormal; Notable for the following components:   MCV 106.5 (*)    All other components within normal limits  I-STAT TROPONIN, ED - Abnormal; Notable for the following components:   Troponin  i, poc 0.20 (*)    All other components within normal limits  RAPID URINE DRUG SCREEN, HOSP PERFORMED  URINALYSIS, ROUTINE W REFLEX MICROSCOPIC  HEPARIN LEVEL (UNFRACTIONATED)    EKG None  Radiology Dg Chest 2 View  Result Date: 09/16/2018 CLINICAL DATA:  Recurrent syncope today. Tachycardia. History of hypertension. EXAM: CHEST - 2 VIEW COMPARISON:  Chest radiograph January 14, 2011 FINDINGS: Cardiac silhouette is upper limits of normal size, mediastinal silhouette is unremarkable. No pleural effusion or focal consolidation. No pneumothorax. Soft tissue planes and included osseous structures are non suspicious. IMPRESSION: Borderline cardiomegaly.  No acute pulmonary process. Electronically Signed   By: Elon Alas M.D.   On: 09/16/2018 14:29    Procedures .Sedation Date/Time: 09/17/2018 9:02 AM Performed by: Merrily Pew, MD Authorized by: Merrily Pew, MD   Consent:    Consent obtained:  Emergent situation and verbal   Consent given by:  Patient   Risks discussed:  Allergic reaction, dysrhythmia, inadequate sedation, nausea, prolonged hypoxia resulting in organ damage, prolonged sedation necessitating reversal, respiratory compromise necessitating ventilatory assistance and intubation and vomiting   Alternatives discussed:  Analgesia without sedation, anxiolysis and regional anesthesia Universal protocol:    Procedure explained and questions answered to patient or proxy's satisfaction: yes     Relevant documents present and verified: yes     Test results available and properly labeled: yes     Imaging studies available: yes     Required blood products, implants, devices, and special equipment available: yes     Site/side marked: yes     Immediately prior to procedure a time out was called: yes     Patient identity confirmation method:  Verbally with patient Indications:    Procedure necessitating sedation performed by:  Physician performing sedation Pre-sedation  assessment:    Time since last food or drink:  Unsure   NPO status caution: unable to specify NPO status     ASA classification: class 1 - normal, healthy patient     Neck mobility: normal     Mouth opening:  3 or more finger widths   Thyromental distance:  4 finger widths   Mallampati score:  I - soft palate, uvula, fauces, pillars visible   Pre-sedation assessments completed and reviewed: airway patency, cardiovascular function, hydration status, mental status, nausea/vomiting, pain level, respiratory function and temperature   Immediate pre-procedure details:    Reassessment: Patient reassessed immediately prior to procedure     Reviewed: vital signs, relevant labs/tests and NPO status     Verified: bag valve mask available, emergency equipment available, intubation equipment available, IV patency confirmed, oxygen available and suction available   Procedure details (see MAR for exact dosages):  Preoxygenation:  Nasal cannula   Sedation:  Propofol and etomidate   Intra-procedure monitoring:  Blood pressure monitoring, cardiac monitor, continuous pulse oximetry, frequent LOC assessments, frequent vital sign checks and continuous capnometry   Intra-procedure events: none     Total Provider sedation time (minutes):  20 Post-procedure details:    Attendance: Constant attendance by certified staff until patient recovered     Recovery: Patient returned to pre-procedure baseline     Post-sedation assessments completed and reviewed: airway patency, cardiovascular function, hydration status, mental status, nausea/vomiting, pain level, respiratory function and temperature     Patient is stable for discharge or admission: yes     Patient tolerance:  Tolerated well, no immediate complications .Cardioversion Date/Time: 09/17/2018 9:04 AM Performed by: Merrily Pew, MD Authorized by: Merrily Pew, MD   Consent:    Consent obtained:  Emergent situation   Risks discussed:  Death, cutaneous  burn and induced arrhythmia   Alternatives discussed:  No treatment, delayed treatment and observation Pre-procedure details:    Cardioversion basis:  Emergent   Rhythm:  Atrial fibrillation   Electrode placement:  Anterior-posterior Patient sedated: Yes. Refer to sedation procedure documentation for details of sedation.  Attempt one:    Cardioversion mode:  Synchronous   Shock (Joules):  120   Shock outcome:  Conversion to normal sinus rhythm Post-procedure details:    Patient status:  Alert   Patient tolerance of procedure:  Tolerated well, no immediate complications .Sedation Date/Time: 09/17/2018 9:04 AM Performed by: Merrily Pew, MD Authorized by: Merrily Pew, MD   Consent:    Consent obtained:  Verbal and emergent situation   Consent given by:  Patient   Risks discussed:  Allergic reaction, dysrhythmia, inadequate sedation, nausea, prolonged hypoxia resulting in organ damage, prolonged sedation necessitating reversal, respiratory compromise necessitating ventilatory assistance and intubation and vomiting   Alternatives discussed:  Analgesia without sedation, anxiolysis and regional anesthesia Universal protocol:    Procedure explained and questions answered to patient or proxy's satisfaction: yes     Relevant documents present and verified: yes     Test results available and properly labeled: yes     Imaging studies available: yes     Required blood products, implants, devices, and special equipment available: yes     Site/side marked: yes     Immediately prior to procedure a time out was called: yes     Patient identity confirmation method:  Verbally with patient Indications:    Procedure necessitating sedation performed by:  Physician performing sedation Pre-sedation assessment:    Time since last food or drink:  Unsure   NPO status caution: unable to specify NPO status     ASA classification: class 1 - normal, healthy patient     Neck mobility: normal     Mouth  opening:  3 or more finger widths   Thyromental distance:  4 finger widths   Mallampati score:  I - soft palate, uvula, fauces, pillars visible   Pre-sedation assessments completed and reviewed: airway patency, cardiovascular function, hydration status, mental status, nausea/vomiting, pain level, respiratory function and temperature   Immediate pre-procedure details:    Reassessment: Patient reassessed immediately prior to procedure     Reviewed: vital signs, relevant labs/tests and NPO status     Verified: bag valve mask available, emergency equipment available, intubation equipment available, IV patency confirmed, oxygen available and suction available   Procedure details (see MAR for exact dosages):    Preoxygenation:  Nasal cannula   Sedation:  Propofol and etomidate   Intra-procedure monitoring:  Blood pressure monitoring, cardiac monitor, continuous pulse oximetry, frequent LOC assessments, frequent vital sign checks and continuous capnometry   Intra-procedure events: none     Total Provider sedation time (minutes):  15 Post-procedure details:    Attendance: Constant attendance by certified staff until patient recovered     Recovery: Patient returned to pre-procedure baseline     Post-sedation assessments completed and reviewed: airway patency, cardiovascular function, hydration status, mental status, nausea/vomiting, pain level, respiratory function and temperature     Patient is stable for discharge or admission: yes     Patient tolerance:  Tolerated well, no immediate complications .Cardioversion Date/Time: 09/17/2018 9:05 AM Performed by: Merrily Pew, MD Authorized by: Merrily Pew, MD   Consent:    Consent obtained:  Emergent situation   Consent given by:  Patient   Risks discussed:  Death and induced arrhythmia   Alternatives discussed:  Rate-control medication, anti-coagulation medication and delayed treatment .Critical Care Performed by: Merrily Pew, MD Authorized  by: Merrily Pew, MD   Critical care provider statement:    Critical care time (minutes):  32   Critical care was time spent personally by me on the following activities:  Discussions with consultants, evaluation of patient's response to treatment, examination of patient, ordering and performing treatments and interventions, ordering and review of laboratory studies, ordering and review of radiographic studies, pulse oximetry, re-evaluation of patient's condition, obtaining history from patient or surrogate and review of old charts   (including critical care time)  Medications Ordered in ED Medications  adenosine (ADENOCARD) 6 MG/2ML injection (has no administration in time range)  diltiazem (CARDIZEM) 1 mg/mL load via infusion 15 mg (0 mg Intravenous Hold 09/16/18 1217)    And  diltiazem (CARDIZEM) 100 mg in dextrose 5% 125mL (1 mg/mL) infusion (15 mg/hr Intravenous Rate/Dose Change 09/16/18 1219)  calcium gluconate 1 g in sodium chloride 0.9 % 100 mL IVPB (has no administration in time range)  heparin ADULT infusion 100 units/mL (25000 units/233mL sodium chloride 0.45%) (1,100 Units/hr Intravenous New Bag/Given 09/16/18 1248)  sodium chloride 0.9 % bolus 1,000 mL (0 mLs Intravenous Stopped 09/16/18 1234)  fentaNYL (SUBLIMAZE) injection 50 mcg (50 mcg Intravenous Given 09/16/18 1103)  etomidate (AMIDATE) injection 10 mg (10 mg Intravenous Given 09/16/18 1058)  heparin bolus via infusion 4,500 Units (4,500 Units Intravenous Bolus from Bag 09/16/18 1248)     Initial Impression / Assessment and Plan / ED Course  I have reviewed the triage vital signs and the nursing notes.  Pertinent labs & imaging results that were available during my care of the patient were reviewed by me and considered in my medical decision making (see chart for details).  On arrival patient in atrial fibrillation with rapid ventricular rate and hypotensive 70 over 50s.  Try to give adenosine as initially it appeared  to be somewhat regular than 1 50-1 60 range which could be SVT however this did not work at all however did reveal that his rate was very irregular consistent with atrial fibrillation.  Secondary to this rapid rate along with hypotension patient was obviously unstable with multiple syncopal episodes so patient was sedated and cardioverted and went into a sinus tachycardia rhythm.  He stayed in this rhythm for approximately 15 to 20 minutes awoke from sedation. He was fully awake and was doing well and then he had a coughing episode and went back into atrial fibrillation with rapid ventricular response and his blood pressure dropped  again.  He was sedated one more time and received another cardioversion and stayed in sinus tachycardia.  His blood pressure still soft in the low 432W upper 03L systolic so was given calcium and started on diltiazem drip and heparin drip.  Cardiology consulted for further management.  Final Clinical Impressions(s) / ED Diagnoses   Final diagnoses:  Atrial fibrillation with rapid ventricular response ([place])  Syncope, unspecified syncope type  Hypotension, unspecified hypotension type    ED Discharge Orders    None       Breanah Faddis, Corene Cornea, MD 09/17/18 [phone]

## 2018-09-16 NOTE — Progress Notes (Signed)
ANTICOAGULATION CONSULT NOTE - Initial Consult  Pharmacy Consult for heparin Indication: atrial fibrillation  Allergies  Allergen Reactions  . Lisinopril Swelling    Lip swelling    Patient Measurements:   Heparin Dosing Weight: 77kg  Vital Signs: BP: 97/79 (11/27 1140) Pulse Rate: 119 (11/27 1140)  Labs: Recent Labs    09/16/18 1047  HGB 14.6  HCT 45.9  PLT 179    CrCl cannot be calculated (Patient's most recent lab result is older than the maximum 21 days allowed.).   Medical History: Past Medical History:  Diagnosis Date  . Asthma   . Chronic kidney disease   . Eczema herpeticum   . Erythroderma   . Erythroderma   . Hypertension   . Wears glasses     Medications:  Scheduled:  . adenosine      . diltiazem  15 mg Intravenous Once    Assessment: 11 YOM presenting with afib, pharmacy consulted for heparin.  No AC PTA.      Goal of Therapy:  Heparin level 0.3-0.7 units/ml Monitor platelets by anticoagulation protocol: Yes   Plan:  Heparin 4500 units IV x 1, then gtt at 1100 units/hr F/u 6 hour heparin level  Bertis Ruddy, PharmD Clinical Pharmacist Please check AMION for all [place] numbers 09/16/2018 11:56 AM

## 2018-09-17 ENCOUNTER — Encounter (HOSPITAL_COMMUNITY): Payer: Self-pay

## 2018-09-17 ENCOUNTER — Inpatient Hospital Stay (HOSPITAL_COMMUNITY): Payer: Self-pay

## 2018-09-17 ENCOUNTER — Other Ambulatory Visit: Payer: Self-pay

## 2018-09-17 DIAGNOSIS — R222 Localized swelling, mass and lump, trunk: Secondary | ICD-10-CM

## 2018-09-17 DIAGNOSIS — I482 Chronic atrial fibrillation, unspecified: Secondary | ICD-10-CM

## 2018-09-17 LAB — CBC
HCT: 36.9 % — ABNORMAL LOW (ref 39.0–52.0)
Hemoglobin: 12.5 g/dL — ABNORMAL LOW (ref 13.0–17.0)
MCH: 34.8 pg — ABNORMAL HIGH (ref 26.0–34.0)
MCHC: 33.9 g/dL (ref 30.0–36.0)
MCV: 102.8 fL — AB (ref 80.0–100.0)
NRBC: 0 % (ref 0.0–0.2)
Platelets: 158 10*3/uL (ref 150–400)
RBC: 3.59 MIL/uL — AB (ref 4.22–5.81)
RDW: 13.1 % (ref 11.5–15.5)
WBC: 5.2 10*3/uL (ref 4.0–10.5)

## 2018-09-17 LAB — HEPARIN LEVEL (UNFRACTIONATED)
Heparin Unfractionated: 0.17 IU/mL — ABNORMAL LOW (ref 0.30–0.70)
Heparin Unfractionated: 0.92 IU/mL — ABNORMAL HIGH (ref 0.30–0.70)
Heparin Unfractionated: 0.48 IU/mL (ref 0.30–0.70)

## 2018-09-17 LAB — MRSA PCR SCREENING: MRSA by PCR: NEGATIVE

## 2018-09-17 LAB — MAGNESIUM: Magnesium: 1.9 mg/dL (ref 1.7–2.4)

## 2018-09-17 LAB — PHOSPHORUS: Phosphorus: 2.6 mg/dL (ref 2.5–4.6)

## 2018-09-17 MED ORDER — AMIODARONE HCL IN DEXTROSE 360-4.14 MG/200ML-% IV SOLN: 30.0000 mg/h | INTRAVENOUS | Status: DC

## 2018-09-17 MED ORDER — LORAZEPAM 2 MG/ML IJ SOLN: 2.0000 mg | INTRAMUSCULAR | Status: DC | PRN

## 2018-09-17 MED ORDER — AMIODARONE HCL IN DEXTROSE 360-4.14 MG/200ML-% IV SOLN: 60.0000 mg/h | INTRAVENOUS | Status: DC

## 2018-09-17 MED ORDER — PHENYLEPHRINE HCL-NACL 10-0.9 MG/250ML-% IV SOLN: 0.0000 ug/min | INTRAVENOUS | Status: DC

## 2018-09-17 MED ORDER — THIAMINE HCL 100 MG/ML IJ SOLN
100.0000 mg | Freq: Every day | INTRAMUSCULAR | Status: DC
  Administered 2018-09-17 – 2018-09-26 (×9): 100 mg via INTRAVENOUS
  Filled 2018-09-17 (×9): qty 2

## 2018-09-17 MED ORDER — FOLIC ACID 5 MG/ML IJ SOLN
1.0000 mg | Freq: Every day | INTRAMUSCULAR | Status: DC
  Administered 2018-09-17 – 2018-09-18 (×2): 1 mg via INTRAVENOUS
  Filled 2018-09-17 (×3): qty 0.2

## 2018-09-17 MED ORDER — WHITE PETROLATUM EX OINT
TOPICAL_OINTMENT | CUTANEOUS | Status: AC
  Administered 2018-09-17: 01:00:00
  Filled 2018-09-17: qty 28.35

## 2018-09-17 MED ORDER — AMIODARONE LOAD VIA INFUSION: 150.0000 mg | Freq: Once | INTRAVENOUS | Status: DC

## 2018-09-17 MED ORDER — HEPARIN BOLUS VIA INFUSION
2500.0000 [IU] | Freq: Once | INTRAVENOUS | Status: AC
  Administered 2018-09-17: 2500 [IU] via INTRAVENOUS
  Filled 2018-09-17: qty 2500

## 2018-09-17 NOTE — Progress Notes (Signed)
   NAMEMelesio Sloan, MRN:  [id number], DOB:  September 07, 1971, LOS: 1 ADMISSION DATE:  09/16/2018, CONSULTATION DATE:  09/16/2018 REFERRING MD:  Cardiology MD - Irish Lack, CHIEF COMPLAINT:  Mobile RA clot   Brief History   47 year old male with PMH of HTN and polycystic kidney who presents to [facility] via cardiology with the chief complaint of syncope.  Patient was picking up hi smedications from the pharmacy when he had a syncopal episode.  EMS was called and the patient was noted to be hypotensive at the time and tachycardic.  Patient was brought to the ED and hypotension resolved with IVF and no O2 demand was noted.  The patient seems rather irritated and did not really answer questions.  Rest of the history is from the chart.   Past Medical History  HTN, PCK, Asthma  Significant Hospital Events   N/A  Consults:  PCCM Vascular surgery  Procedures:  2D echo that I reviewed myself with RA clot moving in and out of the RV.  Reviewed with cardiology.  Significant Diagnostic Tests:  2D echo that I reviewed myself with RA clot moving in and out of the RV.  Reviewed with cardiology.  Micro Data:  N/A  Antimicrobials:  N/A   Interim history/subjective:  Denies distress  Objective   Blood pressure (Abnormal) 113/94, pulse 69, temperature 97.9 F (36.6 C), temperature source Oral, resp. rate (Abnormal) 24, height 5\' 9"  (1.753 m), weight 75.6 kg, SpO2 97 %.        Intake/Output Summary (Last 24 hours) at 09/17/2018 1248 Last data filed at 09/17/2018 1200 Gross per 24 hour  Intake 611.17 ml  Output 350 ml  Net 261.17 ml   Filed Weights   09/16/18 1143 09/16/18 2230  Weight: 77.3 kg 75.6 kg    Examination:  General: This a 47 year old male patient currently resting in bed he is in no acute distress but agitated at times HEENT normocephalic atraumatic no jugular venous distention Pulmonary: Expiratory wheeze no accessory use Cardiac: Regular rate and rhythm Abdomen: Soft  nontender Extremities: Warm dry brisk capillary refill Neuro: Awake oriented no focal deficits GU: Voids  Resolved Hospital Problem list   N/A  Assessment & Plan:   Acute Pulmonary Emboli (Per VQ scan) w/ large mobile mass in the RA -pt initially agreed to lytics but then wanted to reconsider.  -Seen by vascular surgery, concerned that he may have an atrial mass, and clot propagated from this mass resulting in the pulmonary emboli Plan Cont current anticoagulation W/ IV heparin Keep In ICU another 24 hrs CT chest noncontrasted evaluating for etiology of potential mass Follow-up TEE on 11/29 Further recommendations per vascular surgery  Have a history of EtOH abuse drinks 1/5 a day Plan CIWA protocol  Chronic kidney disease with history of polycystic kidney disease Appears as though creatinine ranging from 1.5-2.1 Plan Trend chemistry Strict intake output  H/o asthma  Plan PRN bronchodilators  HTN Plan telemetry monitoring  Anemia (macrocytic) Plan Trend CBC  Erick Colace ACNP-BC [place] Pager # [phone] OR # [phone] if no answer

## 2018-09-17 NOTE — Progress Notes (Signed)
[facility] for heparin Indication: atrial fibrillation  Allergies  Allergen Reactions  . Lisinopril Swelling    Lip swelling    Patient Measurements: Height: 5\' 9"  (175.3 cm) Weight: 166 lb 10.7 oz (75.6 kg) IBW/kg (Calculated) : 70.7 Heparin Dosing Weight: 77kg  Vital Signs: Temp: 98.4 F (36.9 C) (11/28 1900) Temp Source: Oral (11/28 1900) BP: 128/92 (11/28 2100) Pulse Rate: 103 (11/28 2100)  Labs: Recent Labs    09/16/18 1047  09/17/18 0419 09/17/18 1340 09/17/18 2044  HGB 14.6  --  12.5*  --   --   HCT 45.9  --  36.9*  --   --   PLT 179  --  158  --   --   HEPARINUNFRC  --    < > 0.17* 0.92* 0.48  CREATININE 2.10*  --   --   --   --    < > = values in this interval not displayed.    Estimated Creatinine Clearance: 44.4 mL/min (A) (by C-G formula based on SCr of 2.1 mg/dL (H)).  Assessment: 47 yo m presenting with acute PE diagnosed by VQ, large mobile mass in RA. Pharmacy consulted to dose heparin. Not on anticoagulation PTA. Heparin level therapeutic tonight after rate decrease. Hg down to 12.5, plt wnl. No bleed issues documented.  Goal of Therapy:  Heparin level 0.3-0.7 units/ml Monitor platelets by anticoagulation protocol: Yes   Plan:  Continue heparin at 1200 units/hr Confirmatory heparin level with AM labs Monitor daily heparin level and CBC, s/sx bleeding F/U plans for Oral AC F/U IR removal of mobile mass  Elicia Lamp, PharmD, BCPS Clinical Pharmacist Please check AMION for all [place] contact numbers 09/17/2018 10:03 PM

## 2018-09-17 NOTE — Progress Notes (Signed)
[facility] for heparin Indication: atrial fibrillation  Allergies  Allergen Reactions  . Lisinopril Swelling    Lip swelling    Patient Measurements: Height: 5\' 9"  (175.3 cm) Weight: 166 lb 10.7 oz (75.6 kg) IBW/kg (Calculated) : 70.7 Heparin Dosing Weight: 77kg  Vital Signs: Temp: 98.8 F (37.1 C) (11/28 0400) Temp Source: Oral (11/28 0400) BP: 101/73 (11/28 0600) Pulse Rate: 95 (11/28 0600)  Labs: Recent Labs    09/16/18 1047 09/16/18 1812 09/17/18 0419  HGB 14.6  --  12.5*  HCT 45.9  --  36.9*  PLT 179  --  158  HEPARINUNFRC  --  0.47 0.17*  CREATININE 2.10*  --   --     Estimated Creatinine Clearance: 44.4 mL/min (A) (by C-G formula based on SCr of 2.1 mg/dL (H)).  Assessment: 22 YOM presenting with afib continues on IV heparin. First heparin level is therapeutic. Of note, d-dimer is significantly elevated and VQ scan high probability for PE. No bleeding noted.  Heparin level this morning 0.17 units/ml     Goal of Therapy:  Heparin level 0.3-0.7 units/ml Monitor platelets by anticoagulation protocol: Yes   Plan:  Bolus heparin 2500 units and increase heparin gtt 1400 units/hr Confirm dosing with heparin level in 6 hours Daily heparin level and CBC  Thanks for allowing pharmacy to be a part of this patient's care.  Excell Seltzer, PharmD Clinical Pharmacist

## 2018-09-17 NOTE — Consult Note (Signed)
Hospital Consult    Reason for Consult:  Right atrial mass Referring Physician:  Dr. Irish Lack MRN #:  [id number]  History of Present Illness: This is a 47 y.o. male had a syncopal episode yesterday found to be in atrial fibrillation with SVT underwent cardioversion.  Denies any chest pain or shortness of breath at this time.  VQ scan is demonstrated likely pulmonary embolism.  Transthoracic echo demonstrated right atrial mass.  Consult for possible mass extrication. Patient is a daily smoker and drinker.  He has no history of previous chest or neck instrumentation.  He does have a history of polycystic kidney disease.  He does not take blood thinners.  He has elected to not proceed with TPA administration.  Denies any swelling in his legs or arms without a history of blood clotting.  Previously was a Engineer, drilling.  Past Medical History:  Diagnosis Date  . Asthma   . Chronic kidney disease   . Eczema herpeticum   . Erythroderma   . Erythroderma   . Hypertension   . Wears glasses     Past Surgical History:  Procedure Laterality Date  . EYE SURGERY  1976  . HERNIA REPAIR      Allergies  Allergen Reactions  . Lisinopril Swelling    Lip swelling    Prior to Admission medications   Medication Sig Start Date End Date Taking? Authorizing Provider  albuterol (PROVENTIL HFA;VENTOLIN HFA) 108 (90 BASE) MCG/ACT inhaler Inhale 1-2 puffs into the lungs every 6 (six) hours as needed for wheezing or shortness of breath. 12/22/12  Yes Kindl, Nelda Severe, MD  Dupilumab (DUPIXENT [place]) Inject 1 Syringe into the skin every 14 (fourteen) days.   Yes [provider]  losartan-hydrochlorothiazide (HYZAAR) 100-25 MG tablet Take 1 tablet by mouth daily. 05/06/18  Yes Katherine Roan, MD    Social History   Socioeconomic History  . Marital status: Married    Spouse name: Not on file  . Number of children: Not on file  . Years of education: Not on file  . Highest education level: Not on  file  Occupational History  . Not on file  Social Needs  . Financial resource strain: Not on file  . Food insecurity:    Worry: Not on file    Inability: Not on file  . Transportation needs:    Medical: Not on file    Non-medical: Not on file  Tobacco Use  . Smoking status: Current Every Day Smoker    Packs/day: 1.00  . Smokeless tobacco: Never Used  Substance and Sexual Activity  . Alcohol use: Yes    Comment: OCCASIONALLY  . Drug use: No  . Sexual activity: Yes    Birth control/protection: Condom  Lifestyle  . Physical activity:    Days per week: Not on file    Minutes per session: Not on file  . Stress: Not on file  Relationships  . Social connections:    Talks on phone: Not on file    Gets together: Not on file    Attends religious service: Not on file    Active member of club or organization: Not on file    Attends meetings of clubs or organizations: Not on file    Relationship status: Not on file  . Intimate partner violence:    Fear of current or ex partner: Not on file    Emotionally abused: Not on file    Physically abused: Not on file  Forced sexual activity: Not on file  Other Topics Concern  . Not on file  Social History Narrative  . Not on file     Family History  Problem Relation Age of Onset  . Asthma Father   . Kidney disease Father   . Eczema Sister     ROS: [x]  Positive   [ ]  Negative   [ ]  All sytems reviewed and are negative  Cardiovascular: []  chest pain/pressure []  palpitations []  SOB lying flat []  DOE []  pain in legs while walking []  pain in legs at rest []  pain in legs at night []  non-healing ulcers []  hx of DVT []  swelling in legs  Pulmonary: []  productive cough []  asthma/wheezing []  home O2  Neurologic: []  weakness in []  arms []  legs []  numbness in []  arms []  legs []  hx of CVA []  mini stroke [] difficulty speaking or slurred speech []  temporary loss of vision in one eye []  dizziness  Hematologic: []  hx of  cancer []  bleeding problems []  problems with blood clotting easily  Endocrine:   []  diabetes []  thyroid disease  GI []  vomiting blood []  blood in stool  GU: []  CKD/renal failure []  HD--[]  M/W/F or []  T/T/S []  burning with urination []  blood in urine  Psychiatric: []  anxiety []  depression  Musculoskeletal: []  arthritis []  joint pain  Integumentary: []  rashes []  ulcers  Constitutional: []  fever []  chills   Physical Examination  Vitals:   09/17/18 1100 09/17/18 1137  BP: (!) 113/94   Pulse: 69   Resp: (!) 24   Temp:  97.9 F (36.6 C)  SpO2: 97%    Body mass index is 24.61 kg/m.  General nad Gait: Not observed HENT: WNL Pulmonary: normal non-labored breathing, on room air Cardiac: Palpable radial and pedal pulses bilaterally Abdomen: soft, NT/ND, no masses Extremities: There is no edema of either his bilateral upper or lower extremities Musculoskeletal: no muscle wasting or atrophy  Neurologic: A&O X 3; Appropriate Affect ; SENSATION: normal; MOTOR FUNCTION:  moving all extremities equally. Speech is fluent/normal   CBC    Component Value Date/Time   WBC 5.2 09/17/2018 0419   RBC 3.59 (L) 09/17/2018 0419   HGB 12.5 (L) 09/17/2018 0419   HCT 36.9 (L) 09/17/2018 0419   PLT 158 09/17/2018 0419   MCV 102.8 (H) 09/17/2018 0419   MCH 34.8 (H) 09/17/2018 0419   MCHC 33.9 09/17/2018 0419   RDW 13.1 09/17/2018 0419   LYMPHSABS 1.4 09/06/2017 1225   MONOABS 0.4 09/06/2017 1225   EOSABS 1.0 (H) 09/06/2017 1225   BASOSABS 0.0 09/06/2017 1225    BMET    Component Value Date/Time   NA 139 09/16/2018 1047   NA 143 04/22/2018 1026   K 3.7 09/16/2018 1047   CL 109 09/16/2018 1047   CO2 21 (L) 09/16/2018 1047   GLUCOSE 114 (H) 09/16/2018 1047   BUN 18 09/16/2018 1047   BUN 19 04/22/2018 1026   CREATININE 2.10 (H) 09/16/2018 1047   CALCIUM 8.3 (L) 09/16/2018 1047   GFRNONAA 37 (L) 09/16/2018 1047   GFRAA 43 (L) 09/16/2018 1047    COAGS: No results  found for: INR, PROTIME   Non-Invasive Vascular Imaging:   tte images reviewed:  Study Conclusions  - Left ventricle: The cavity size was normal. Systolic function was   mildly reduced. The estimated ejection fraction was in the range   of 45% to 50%. Diffuse hypokinesis. Although no diagnostic   regional wall motion abnormality was identified,  this possibility   cannot be completely excluded on the basis of this study. - Ventricular septum: The contour showed diastolic flattening and   systolic flattening. These changes are consistent with RV   pressure overload. - Right ventricle: The cavity size was severely dilated. Wall   thickness was normal. Systolic function was severely reduced. - Right atrium: The atrium was massively dilated. There was a   thrombusin the atrial cavity. - Tricuspid valve: There was moderate regurgitation. - Pulmonary arteries: Systolic pressure was severely increased. PA   peak pressure: 59 mm Hg (S).   ASSESSMENT/PLAN: This is a 47 y.o. male presented with syncopal episode and VQ scan demonstrated likely pulmonary embolism with echo demonstrating a right atrial mass.  Plan will be to continue heparin and obtain trans-esophageal echo tomorrow.  I have ordered a CT scan of his chest abdomen pelvis to evaluate for other pathology.  Pending above studies patient will possibly be candidate for Angiovac removal of the mass.  I will discuss further with the family when we have more information.  Procedure would be early next week.  Jedidiah Demartini C. Donzetta Matters, MD [facility] Office: [phone] Pager: [phone]

## 2018-09-17 NOTE — Progress Notes (Signed)
[facility] for heparin Indication: atrial fibrillation  Allergies  Allergen Reactions  . Lisinopril Swelling    Lip swelling    Patient Measurements: Height: 5\' 9"  (175.3 cm) Weight: 166 lb 10.7 oz (75.6 kg) IBW/kg (Calculated) : 70.7 Heparin Dosing Weight: 77kg  Vital Signs: Temp: 97.9 F (36.6 C) (11/28 1137) Temp Source: Oral (11/28 1137) BP: 137/99 (11/28 1400) Pulse Rate: 131 (11/28 1400)  Labs: Recent Labs    09/16/18 1047 09/16/18 1812 09/17/18 0419 09/17/18 1340  HGB 14.6  --  12.5*  --   HCT 45.9  --  36.9*  --   PLT 179  --  158  --   HEPARINUNFRC  --  0.47 0.17* 0.92*  CREATININE 2.10*  --   --   --     Estimated Creatinine Clearance: 44.4 mL/min (A) (by C-G formula based on SCr of 2.1 mg/dL (H)).  Assessment: CC/HPI: 47 yo m presenting with acute PE  PMH: HTN, PKD    Anticoag: acute PE as diagnosed by VQ, large mobile mass in RA HL 0.92 on 1400 units/hr  Renal: SCr 2.1  Pulm: RA  Heme/Onc: H&H 12.5/36.9, Plt 158  Goal of Therapy:  Heparin level 0.3-0.7 units/ml Monitor platelets by anticoagulation protocol: Yes   Plan:  decrease heparin 1200 units/hr 2100 HL Daily HL CBC F/U plans for Oral AC F/U IR removal of mobile mass  Levester Fresh, PharmD, BCPS, BCCCP Clinical Pharmacist [phone]  Please check AMION for all [place] numbers  09/17/2018 2:06 PM

## 2018-09-18 ENCOUNTER — Encounter (HOSPITAL_COMMUNITY)
Admission: EM | Disposition: A | Discharge status: Home / Self Care | Payer: Self-pay | Source: Home / Self Care | Attending: Cardiothoracic Surgery

## 2018-09-18 ENCOUNTER — Inpatient Hospital Stay (HOSPITAL_COMMUNITY): Payer: Self-pay | Admitting: Anesthesiology

## 2018-09-18 ENCOUNTER — Inpatient Hospital Stay (HOSPITAL_COMMUNITY): Payer: Self-pay

## 2018-09-18 ENCOUNTER — Encounter (HOSPITAL_COMMUNITY): Payer: Self-pay | Admitting: *Deleted

## 2018-09-18 DIAGNOSIS — I517 Cardiomegaly: Secondary | ICD-10-CM

## 2018-09-18 DIAGNOSIS — I482 Chronic atrial fibrillation, unspecified: Secondary | ICD-10-CM

## 2018-09-18 DIAGNOSIS — I361 Nonrheumatic tricuspid (valve) insufficiency: Secondary | ICD-10-CM

## 2018-09-18 DIAGNOSIS — I10 Essential (primary) hypertension: Secondary | ICD-10-CM

## 2018-09-18 DIAGNOSIS — I5189 Other ill-defined heart diseases: Secondary | ICD-10-CM

## 2018-09-18 DIAGNOSIS — Q211 Atrial septal defect: Secondary | ICD-10-CM

## 2018-09-18 HISTORY — PX: LEFT HEART CATH AND CORONARY ANGIOGRAPHY: CATH118249

## 2018-09-18 HISTORY — PX: TEE WITHOUT CARDIOVERSION: SHX5443

## 2018-09-18 LAB — BASIC METABOLIC PANEL
ANION GAP: 6 (ref 5–15)
BUN: 19 mg/dL (ref 6–20)
CO2: 21 mmol/L — ABNORMAL LOW (ref 22–32)
Calcium: 8.4 mg/dL — ABNORMAL LOW (ref 8.9–10.3)
Chloride: 110 mmol/L (ref 98–111)
Creatinine, Ser: 1.93 mg/dL — ABNORMAL HIGH (ref 0.61–1.24)
GFR calc Af Amer: 47 mL/min — ABNORMAL LOW (ref >60)
GFR calc non Af Amer: 41 mL/min — ABNORMAL LOW (ref >60)
Glucose, Bld: 101 mg/dL — ABNORMAL HIGH (ref 70–99)
Potassium: 3.6 mmol/L (ref 3.5–5.1)
Sodium: 137 mmol/L (ref 135–145)

## 2018-09-18 LAB — CBC
HCT: 37.9 % — ABNORMAL LOW (ref 39.0–52.0)
Hemoglobin: 12.5 g/dL — ABNORMAL LOW (ref 13.0–17.0)
MCH: 33.7 pg (ref 26.0–34.0)
MCHC: 33 g/dL (ref 30.0–36.0)
MCV: 102.2 fL — AB (ref 80.0–100.0)
NRBC: 0 % (ref 0.0–0.2)
PLATELETS: 175 10*3/uL (ref 150–400)
RBC: 3.71 MIL/uL — ABNORMAL LOW (ref 4.22–5.81)
RDW: 12.7 % (ref 11.5–15.5)
WBC: 4.3 10*3/uL (ref 4.0–10.5)

## 2018-09-18 LAB — HEPARIN LEVEL (UNFRACTIONATED): Heparin Unfractionated: 0.44 IU/mL (ref 0.30–0.70)

## 2018-09-18 LAB — PREPARE RBC (CROSSMATCH)

## 2018-09-18 SURGERY — LEFT HEART CATH AND CORONARY ANGIOGRAPHY
Anesthesia: LOCAL

## 2018-09-18 SURGERY — ECHOCARDIOGRAM, TRANSESOPHAGEAL
Anesthesia: Monitor Anesthesia Care

## 2018-09-18 MED ORDER — METOPROLOL TARTRATE 12.5 MG HALF TABLET
12.5000 mg | ORAL_TABLET | Freq: Once | ORAL | Status: AC
  Administered 2018-09-19: 12.5 mg via ORAL
  Filled 2018-09-18: qty 1

## 2018-09-18 MED ORDER — SODIUM CHLORIDE 0.9 % IV SOLN: INTRAVENOUS | Status: AC

## 2018-09-18 MED ORDER — DOPAMINE-DEXTROSE 3.2-5 MG/ML-% IV SOLN
0.0000 ug/kg/min | INTRAVENOUS | Status: DC
  Filled 2018-09-18: qty 250

## 2018-09-18 MED ORDER — TRANEXAMIC ACID 1000 MG/10ML IV SOLN
1.5000 mg/kg/h | INTRAVENOUS | Status: DC
  Filled 2018-09-18: qty 25

## 2018-09-18 MED ORDER — PHENYLEPHRINE HCL-NACL 20-0.9 MG/250ML-% IV SOLN
30.0000 ug/min | INTRAVENOUS | Status: DC
  Filled 2018-09-18: qty 250

## 2018-09-18 MED ORDER — TRANEXAMIC ACID (OHS) BOLUS VIA INFUSION
15.0000 mg/kg | INTRAVENOUS | Status: AC
  Administered 2018-09-19: 1134 mg via INTRAVENOUS
  Filled 2018-09-18: qty 1134

## 2018-09-18 MED ORDER — CHLORHEXIDINE GLUCONATE 0.12 % MT SOLN: 15.0000 mL | Freq: Once | OROMUCOSAL | Status: DC

## 2018-09-18 MED ORDER — SODIUM CHLORIDE 0.9 % IV SOLN
1.5000 g | INTRAVENOUS | Status: DC
  Filled 2018-09-18: qty 1.5

## 2018-09-18 MED ORDER — PHENYLEPHRINE HCL-NACL 20-0.9 MG/250ML-% IV SOLN
30.0000 ug/min | INTRAVENOUS | Status: AC
  Administered 2018-09-19: 20 ug/min via INTRAVENOUS
  Filled 2018-09-18: qty 250

## 2018-09-18 MED ORDER — EPINEPHRINE PF 1 MG/ML IJ SOLN
0.0000 ug/min | INTRAVENOUS | Status: DC
  Filled 2018-09-18: qty 4

## 2018-09-18 MED ORDER — FENTANYL CITRATE (PF) 100 MCG/2ML IJ SOLN
INTRAMUSCULAR | Status: AC
  Filled 2018-09-18: qty 2

## 2018-09-18 MED ORDER — SODIUM CHLORIDE 0.9 % IV SOLN
INTRAVENOUS | Status: DC
  Filled 2018-09-18: qty 30

## 2018-09-18 MED ORDER — TEMAZEPAM 15 MG PO CAPS: 15.0000 mg | ORAL_CAPSULE | Freq: Once | ORAL | Status: DC | PRN

## 2018-09-18 MED ORDER — SODIUM CHLORIDE 0.9% FLUSH: 3.0000 mL | INTRAVENOUS | Status: DC | PRN

## 2018-09-18 MED ORDER — SODIUM CHLORIDE 0.9 % IV SOLN
750.0000 mg | INTRAVENOUS | Status: DC
  Filled 2018-09-18: qty 750

## 2018-09-18 MED ORDER — SODIUM CHLORIDE 0.9 % IV SOLN
1.5000 g | INTRAVENOUS | Status: AC
  Administered 2018-09-19: .75 g via INTRAVENOUS
  Administered 2018-09-19: 1.5 g via INTRAVENOUS
  Filled 2018-09-18: qty 1.5

## 2018-09-18 MED ORDER — SODIUM CHLORIDE 0.9% FLUSH: 3.0000 mL | Freq: Two times a day (BID) | INTRAVENOUS | Status: DC

## 2018-09-18 MED ORDER — EPINEPHRINE PF 1 MG/ML IJ SOLN
0.0000 ug/min | INTRAVENOUS | Status: AC
  Administered 2018-09-19: 2 ug/min via INTRAVENOUS
  Filled 2018-09-18: qty 4

## 2018-09-18 MED ORDER — POTASSIUM CHLORIDE 2 MEQ/ML IV SOLN
80.0000 meq | INTRAVENOUS | Status: DC
  Filled 2018-09-18: qty 40

## 2018-09-18 MED ORDER — ONDANSETRON HCL 4 MG/2ML IJ SOLN: 4.0000 mg | Freq: Four times a day (QID) | INTRAMUSCULAR | Status: DC | PRN

## 2018-09-18 MED ORDER — CHLORHEXIDINE GLUCONATE CLOTH 2 % EX PADS
6.0000 | MEDICATED_PAD | Freq: Once | CUTANEOUS | Status: AC
  Administered 2018-09-19: 6 via TOPICAL

## 2018-09-18 MED ORDER — VANCOMYCIN HCL 10 G IV SOLR
1250.0000 mg | INTRAVENOUS | Status: DC
  Filled 2018-09-18: qty 1250

## 2018-09-18 MED ORDER — MILRINONE LACTATE IN DEXTROSE 20-5 MG/100ML-% IV SOLN
0.3000 ug/kg/min | INTRAVENOUS | Status: AC
  Administered 2018-09-19: .25 ug/kg/min via INTRAVENOUS
  Filled 2018-09-18: qty 100

## 2018-09-18 MED ORDER — DEXMEDETOMIDINE HCL IN NACL 400 MCG/100ML IV SOLN
0.1000 ug/kg/h | INTRAVENOUS | Status: AC
  Administered 2018-09-19: .3 ug/kg/h via INTRAVENOUS
  Filled 2018-09-18 (×2): qty 100

## 2018-09-18 MED ORDER — MIDAZOLAM HCL 2 MG/2ML IJ SOLN
INTRAMUSCULAR | Status: DC | PRN
  Administered 2018-09-18: 2 mg via INTRAVENOUS
  Administered 2018-09-18: 1 mg via INTRAVENOUS

## 2018-09-18 MED ORDER — HEPARIN SODIUM (PORCINE) 1000 UNIT/ML IJ SOLN
INTRAMUSCULAR | Status: DC | PRN
  Administered 2018-09-18: 4000 [IU] via INTRAVENOUS

## 2018-09-18 MED ORDER — LIDOCAINE HCL (PF) 1 % IJ SOLN
INTRAMUSCULAR | Status: AC
  Filled 2018-09-18: qty 30

## 2018-09-18 MED ORDER — SODIUM CHLORIDE 0.9 % IV SOLN: 250.0000 mL | INTRAVENOUS | Status: DC | PRN

## 2018-09-18 MED ORDER — LIDOCAINE HCL (PF) 1 % IJ SOLN
INTRAMUSCULAR | Status: DC | PRN
  Administered 2018-09-18: 2 mL

## 2018-09-18 MED ORDER — VERAPAMIL HCL 2.5 MG/ML IV SOLN
INTRAVENOUS | Status: DC | PRN
  Administered 2018-09-18: 10 mL via INTRA_ARTERIAL

## 2018-09-18 MED ORDER — MIDAZOLAM HCL 2 MG/2ML IJ SOLN
INTRAMUSCULAR | Status: AC
  Filled 2018-09-18: qty 2

## 2018-09-18 MED ORDER — MAGNESIUM SULFATE 50 % IJ SOLN
40.0000 meq | INTRAMUSCULAR | Status: DC
  Filled 2018-09-18: qty 9.85

## 2018-09-18 MED ORDER — PROPOFOL 10 MG/ML IV BOLUS
INTRAVENOUS | Status: DC | PRN
  Administered 2018-09-18: 20 mg via INTRAVENOUS

## 2018-09-18 MED ORDER — HEPARIN (PORCINE) 25000 UT/250ML-% IV SOLN
1300.0000 [IU]/h | INTRAVENOUS | Status: DC
  Administered 2018-09-18 – 2018-09-19 (×2): 1300 [IU]/h via INTRAVENOUS
  Filled 2018-09-18: qty 250

## 2018-09-18 MED ORDER — TRANEXAMIC ACID (OHS) PUMP PRIME SOLUTION
2.0000 mg/kg | INTRAVENOUS | Status: DC
  Filled 2018-09-18: qty 1.51

## 2018-09-18 MED ORDER — SODIUM CHLORIDE 0.9 % IV SOLN
INTRAVENOUS | Status: DC
  Administered 2018-09-18: 11:00:00 via INTRAVENOUS

## 2018-09-18 MED ORDER — NITROGLYCERIN IN D5W 200-5 MCG/ML-% IV SOLN: 2.0000 ug/min | INTRAVENOUS | Status: DC

## 2018-09-18 MED ORDER — SODIUM CHLORIDE 0.9% FLUSH
3.0000 mL | Freq: Two times a day (BID) | INTRAVENOUS | Status: DC
  Administered 2018-09-18: 3 mL via INTRAVENOUS

## 2018-09-18 MED ORDER — DEXMEDETOMIDINE HCL IN NACL 400 MCG/100ML IV SOLN
0.1000 ug/kg/h | INTRAVENOUS | Status: DC
  Filled 2018-09-18 (×2): qty 100

## 2018-09-18 MED ORDER — NOREPINEPHRINE 4 MG/250ML-% IV SOLN
0.0000 ug/min | INTRAVENOUS | Status: DC
  Filled 2018-09-18: qty 250

## 2018-09-18 MED ORDER — MILRINONE LACTATE IN DEXTROSE 20-5 MG/100ML-% IV SOLN
0.3000 ug/kg/min | INTRAVENOUS | Status: DC
  Filled 2018-09-18: qty 100

## 2018-09-18 MED ORDER — FENTANYL CITRATE (PF) 100 MCG/2ML IJ SOLN
INTRAMUSCULAR | Status: DC | PRN
  Administered 2018-09-18 (×2): 25 ug via INTRAVENOUS

## 2018-09-18 MED ORDER — HEPARIN (PORCINE) IN NACL 1000-0.9 UT/500ML-% IV SOLN
INTRAVENOUS | Status: AC
  Filled 2018-09-18: qty 1000

## 2018-09-18 MED ORDER — TRANEXAMIC ACID 1000 MG/10ML IV SOLN
1.5000 mg/kg/h | INTRAVENOUS | Status: AC
  Administered 2018-09-19: 1.5 mg/kg/h via INTRAVENOUS
  Filled 2018-09-18: qty 25

## 2018-09-18 MED ORDER — ACETAMINOPHEN 325 MG PO TABS: 650.0000 mg | ORAL_TABLET | ORAL | Status: DC | PRN

## 2018-09-18 MED ORDER — NICOTINE 21 MG/24HR TD PT24
21.0000 mg | MEDICATED_PATCH | Freq: Every day | TRANSDERMAL | Status: DC
  Administered 2018-09-20: 21 mg via TRANSDERMAL
  Filled 2018-09-18 (×5): qty 1

## 2018-09-18 MED ORDER — NITROGLYCERIN IN D5W 200-5 MCG/ML-% IV SOLN
2.0000 ug/min | INTRAVENOUS | Status: DC
  Filled 2018-09-18: qty 250

## 2018-09-18 MED ORDER — PLASMA-LYTE 148 IV SOLN
INTRAVENOUS | Status: AC
  Administered 2018-09-19: 500 mL
  Filled 2018-09-18: qty 2.5

## 2018-09-18 MED ORDER — SODIUM CHLORIDE 0.9 % WEIGHT BASED INFUSION: 1.0000 mL/kg/h | INTRAVENOUS | Status: DC

## 2018-09-18 MED ORDER — SODIUM CHLORIDE 0.9 % WEIGHT BASED INFUSION: 3.0000 mL/kg/h | INTRAVENOUS | Status: DC

## 2018-09-18 MED ORDER — PROPOFOL 500 MG/50ML IV EMUL
INTRAVENOUS | Status: DC | PRN
  Administered 2018-09-18: 150 ug/kg/min via INTRAVENOUS

## 2018-09-18 MED ORDER — VERAPAMIL HCL 2.5 MG/ML IV SOLN
INTRAVENOUS | Status: AC
  Filled 2018-09-18: qty 2

## 2018-09-18 MED ORDER — BISACODYL 5 MG PO TBEC: 5.0000 mg | DELAYED_RELEASE_TABLET | Freq: Once | ORAL | Status: DC

## 2018-09-18 MED ORDER — VANCOMYCIN HCL 10 G IV SOLR
1250.0000 mg | INTRAVENOUS | Status: AC
  Administered 2018-09-19: 1250 mg via INTRAVENOUS
  Filled 2018-09-18: qty 1250

## 2018-09-18 MED ORDER — SODIUM CHLORIDE 0.9 % IV SOLN
INTRAVENOUS | Status: AC | PRN
  Administered 2018-09-18: 500 mL via INTRAVENOUS

## 2018-09-18 MED ORDER — INSULIN REGULAR(HUMAN) IN NACL 100-0.9 UT/100ML-% IV SOLN: INTRAVENOUS | Status: DC

## 2018-09-18 MED ORDER — SODIUM CHLORIDE 0.9 % IV SOLN
250.0000 mL | INTRAVENOUS | Status: DC | PRN
  Administered 2018-09-19: 09:00:00 via INTRAVENOUS

## 2018-09-18 MED ORDER — HEPARIN (PORCINE) IN NACL 1000-0.9 UT/500ML-% IV SOLN
INTRAVENOUS | Status: DC | PRN
  Administered 2018-09-18 (×2): 500 mL

## 2018-09-18 MED ORDER — IOHEXOL 350 MG/ML SOLN
INTRAVENOUS | Status: DC | PRN
  Administered 2018-09-18: 23 mL via INTRAVENOUS

## 2018-09-18 MED ORDER — HEPARIN (PORCINE) 25000 UT/250ML-% IV SOLN: 1200.0000 [IU]/h | INTRAVENOUS | Status: DC

## 2018-09-18 MED ORDER — TRANEXAMIC ACID (OHS) BOLUS VIA INFUSION
15.0000 mg/kg | INTRAVENOUS | Status: DC
  Filled 2018-09-18: qty 1134

## 2018-09-18 MED ORDER — PLASMA-LYTE 148 IV SOLN
INTRAVENOUS | Status: DC
  Filled 2018-09-18: qty 2.5

## 2018-09-18 MED ORDER — INSULIN REGULAR(HUMAN) IN NACL 100-0.9 UT/100ML-% IV SOLN
INTRAVENOUS | Status: AC
  Administered 2018-09-19: 1 [IU]/h via INTRAVENOUS
  Filled 2018-09-18: qty 100

## 2018-09-18 SURGICAL SUPPLY — 10 items
CATH 5FR JL3.5 JR4 ANG PIG MP (CATHETERS) ×2 IMPLANT
DEVICE RAD COMP TR BAND LRG (VASCULAR PRODUCTS) ×2 IMPLANT
GLIDESHEATH SLEND SS 6F .021 (SHEATH) ×2 IMPLANT
GUIDEWIRE INQWIRE 1.5J.035X260 (WIRE) ×1 IMPLANT
INQWIRE 1.5J .035X260CM (WIRE) ×2
KIT HEART LEFT (KITS) ×2 IMPLANT
PACK CARDIAC CATHETERIZATION (CUSTOM PROCEDURE TRAY) ×2 IMPLANT
SHEATH PROBE COVER 6X72 (BAG) ×2 IMPLANT
TRANSDUCER W/STOPCOCK (MISCELLANEOUS) ×2 IMPLANT
TUBING CIL FLEX 10 FLL-RA (TUBING) ×2 IMPLANT

## 2018-09-18 NOTE — Progress Notes (Addendum)
Progress Note  Patient Name: [name] Date of Encounter: 09/18/2018  Primary Cardiologist: No primary care provider on file.   Subjective   Mr. [name] was seen resting in his bed this morning. He does not seem to understand the gravity of the situation and has been refusing numerous procedures.   Inpatient Medications    Scheduled Meds: . diltiazem  15 mg Intravenous Once  . folic acid  1 mg Intravenous Daily  . thiamine injection  100 mg Intravenous Daily   Continuous Infusions: . diltiazem (CARDIZEM) infusion 5 mg/hr (09/17/18 1444)  . heparin 1,200 Units/hr (09/18/18 0011)   PRN Meds: LORazepam   Vital Signs    Vitals:   09/18/18 0400 09/18/18 0500 09/18/18 0600 09/18/18 0700  BP: (!) 131/104 (!) 120/108 123/77 (!) 131/119  Pulse: 95 93 93 92  Resp: 20 19 18 18   Temp: 98.9 F (37.2 C)     TempSrc: Oral     SpO2: 95% 94% 95% 96%  Weight:      Height:        Intake/Output Summary (Last 24 hours) at 09/18/2018 0804 Last data filed at 09/18/2018 0800 Gross per 24 hour  Intake 533.58 ml  Output 1125 ml  Net -591.42 ml   Filed Weights   09/16/18 1143 09/16/18 2230  Weight: 77.3 kg 75.6 kg    Telemetry    NSR - Personally Reviewed  ECG    None today - Personally Reviewed  Physical Exam   Physical Exam  Constitutional: Appears well-developed and well-nourished. No distress.  HENT:  Head: Normocephalic and atraumatic.  Eyes: Conjunctivae are normal.  Cardiovascular: Normal rate, regular rhythm and normal heart sounds.  Respiratory: Effort normal and breath sounds normal. No respiratory distress. Mild wheezes diffuse. GI: Soft. Bowel sounds are normal. No distension. There is no tenderness.  Musculoskeletal: No edema.  Neurological: Is alert.  Skin: Not diaphoretic. No erythema.  Psychiatric: Normal mood and affect. Behavior is normal. Judgment and thought content normal.    Labs    Chemistry Recent Labs  Lab 09/16/18 1047  09/18/18 0434  NA 139 137  K 3.7 3.6  CL 109 110  CO2 21* 21*  GLUCOSE 114* 101*  BUN 18 19  CREATININE 2.10* 1.93*  CALCIUM 8.3* 8.4*  GFRNONAA 37* 41*  GFRAA 43* 47*  ANIONGAP 9 6     Hematology Recent Labs  Lab 09/16/18 1047 09/17/18 0419 09/18/18 0434  WBC 5.8 5.2 4.3  RBC 4.31 3.59* 3.71*  HGB 14.6 12.5* 12.5*  HCT 45.9 36.9* 37.9*  MCV 106.5* 102.8* 102.2*  MCH 33.9 34.8* 33.7  MCHC 31.8 33.9 33.0  RDW 13.1 13.1 12.7  PLT 179 158 175    Cardiac EnzymesNo results for input(s): TROPONINI in the last 168 hours.  Recent Labs  Lab 09/16/18 1058  TROPIPOC 0.20*     BNPNo results for input(s): BNP, PROBNP in the last 168 hours.   DDimer  Recent Labs  Lab 09/16/18 1812  DDIMER >20.00*     Radiology    Ct Abdomen Pelvis Wo Contrast  Result Date: 09/17/2018 CLINICAL DATA:  Right atrial mass.  Evaluate for other pathology. EXAM: CT CHEST, ABDOMEN AND PELVIS WITHOUT CONTRAST TECHNIQUE: Multidetector CT imaging of the chest, abdomen and pelvis was performed following the standard protocol without IV contrast. COMPARISON:  None. FINDINGS: CT CHEST FINDINGS Cardiovascular: The heart size is borderline. The reported atrial mass cannot be assessed on this study without contrast. Evaluation of the  thoracic aorta is also limited without contrast but no aneurysm or atherosclerotic change is noted. The main pulmonary arteries are unremarkable. Mediastinum/Nodes: The thyroid and esophagus are unremarkable. No adenopathy. Tiny pleural effusions. No pericardial effusion. No other acute abnormalities in the mediastinum. Lungs/Pleura: Paraseptal emphysematous changes and scarring in the right apex. Foci of air to the right of the trachea and esophagus likely represent diverticula of no acute significance. Central airways are otherwise normal. No pneumothorax. Small bilateral pleural effusions. No suspicious nodules or masses. Subtle tiny ground-glass nodules, particularly in the  upper lobes may be centrilobular suggesting the possibility of subtle respiratory bronchiolitis interstitial lung disease given the patient's smoking history. Musculoskeletal: See below. CT ABDOMEN PELVIS FINDINGS Hepatobiliary: Multiple hepatic cysts are noted. There is a 3.4 cm mass in the left hepatic lobe on axial image 52 which does not appear to be cystic. The gallbladder is unremarkable on limited images. Pancreas: Unremarkable. No pancreatic ductal dilatation or surrounding inflammatory changes. Spleen: Normal in size without focal abnormality. Adrenals/Urinary Tract: Adrenal glands are normal. Innumerable simple and hyperdense cysts throughout both kidneys. Probable parapelvic cysts on the right. A fluid-filled structure on the left as seen on coronal image 73 is likely a dilated left renal pelvis. I also suspect mild hydronephrosis. The ureters are normal in caliber. The bladder is normal. Stomach/Bowel: The stomach and small bowel are normal. The colon is unremarkable. The appendix is normal. Vascular/Lymphatic: No significant vascular findings are present. No enlarged abdominal or pelvic lymph nodes. Reproductive: Prostate is unremarkable. Other: There is a small amount of free fluid in the pelvis. Musculoskeletal: No acute or significant osseous findings. IMPRESSION: 1. There is a left hepatic mass measuring 3.4 cm which is indeterminate. Recommend an MRI for further evaluation. 2. Apparent dilated left extrarenal pelvis without ureteral dilatation. Suspected mild hydronephrosis. The findings raise the possibility of a UPJ obstruction. An ultrasound could better evaluate. No stones identified. 3. Subtle respiratory bronchiolitis interstitial lung disease not excluded with tiny subtle ground-glass nodules, particularly in the upper lobes. 4. Polycystic renal disease.  Liver cysts are noted as well. 5. Tiny pleural effusions of uncertain etiology. 6. There is a small amount of fluid in the pelvis which is  of uncertain etiology as well. Electronically Signed   By: Dorise Bullion III M.D   On: 09/17/2018 16:14   Dg Chest 2 View  Result Date: 09/16/2018 CLINICAL DATA:  Recurrent syncope today. Tachycardia. History of hypertension. EXAM: CHEST - 2 VIEW COMPARISON:  Chest radiograph January 14, 2011 FINDINGS: Cardiac silhouette is upper limits of normal size, mediastinal silhouette is unremarkable. No pleural effusion or focal consolidation. No pneumothorax. Soft tissue planes and included osseous structures are non suspicious. IMPRESSION: Borderline cardiomegaly.  No acute pulmonary process. Electronically Signed   By: Elon Alas M.D.   On: 09/16/2018 14:29   Ct Chest Wo Contrast  Result Date: 09/17/2018 CLINICAL DATA:  Right atrial mass.  Evaluate for other pathology. EXAM: CT CHEST, ABDOMEN AND PELVIS WITHOUT CONTRAST TECHNIQUE: Multidetector CT imaging of the chest, abdomen and pelvis was performed following the standard protocol without IV contrast. COMPARISON:  None. FINDINGS: CT CHEST FINDINGS Cardiovascular: The heart size is borderline. The reported atrial mass cannot be assessed on this study without contrast. Evaluation of the thoracic aorta is also limited without contrast but no aneurysm or atherosclerotic change is noted. The main pulmonary arteries are unremarkable. Mediastinum/Nodes: The thyroid and esophagus are unremarkable. No adenopathy. Tiny pleural effusions. No pericardial effusion. No other  acute abnormalities in the mediastinum. Lungs/Pleura: Paraseptal emphysematous changes and scarring in the right apex. Foci of air to the right of the trachea and esophagus likely represent diverticula of no acute significance. Central airways are otherwise normal. No pneumothorax. Small bilateral pleural effusions. No suspicious nodules or masses. Subtle tiny ground-glass nodules, particularly in the upper lobes may be centrilobular suggesting the possibility of subtle respiratory bronchiolitis  interstitial lung disease given the patient's smoking history. Musculoskeletal: See below. CT ABDOMEN PELVIS FINDINGS Hepatobiliary: Multiple hepatic cysts are noted. There is a 3.4 cm mass in the left hepatic lobe on axial image 52 which does not appear to be cystic. The gallbladder is unremarkable on limited images. Pancreas: Unremarkable. No pancreatic ductal dilatation or surrounding inflammatory changes. Spleen: Normal in size without focal abnormality. Adrenals/Urinary Tract: Adrenal glands are normal. Innumerable simple and hyperdense cysts throughout both kidneys. Probable parapelvic cysts on the right. A fluid-filled structure on the left as seen on coronal image 73 is likely a dilated left renal pelvis. I also suspect mild hydronephrosis. The ureters are normal in caliber. The bladder is normal. Stomach/Bowel: The stomach and small bowel are normal. The colon is unremarkable. The appendix is normal. Vascular/Lymphatic: No significant vascular findings are present. No enlarged abdominal or pelvic lymph nodes. Reproductive: Prostate is unremarkable. Other: There is a small amount of free fluid in the pelvis. Musculoskeletal: No acute or significant osseous findings. IMPRESSION: 1. There is a left hepatic mass measuring 3.4 cm which is indeterminate. Recommend an MRI for further evaluation. 2. Apparent dilated left extrarenal pelvis without ureteral dilatation. Suspected mild hydronephrosis. The findings raise the possibility of a UPJ obstruction. An ultrasound could better evaluate. No stones identified. 3. Subtle respiratory bronchiolitis interstitial lung disease not excluded with tiny subtle ground-glass nodules, particularly in the upper lobes. 4. Polycystic renal disease.  Liver cysts are noted as well. 5. Tiny pleural effusions of uncertain etiology. 6. There is a small amount of fluid in the pelvis which is of uncertain etiology as well. Electronically Signed   By: Dorise Bullion III M.D   On:  09/17/2018 16:14   Nm Pulmonary Perf And Vent  Result Date: 09/16/2018 CLINICAL DATA:  47 y/o  M; PE suspected, high pretest probability. EXAM: NUCLEAR MEDICINE VENTILATION - PERFUSION LUNG SCAN TECHNIQUE: Ventilation images were obtained in multiple projections using inhaled aerosol Tc-37m DTPA. Perfusion images were obtained in multiple projections after intravenous injection of Tc-44m MAA. RADIOPHARMACEUTICALS:  31.0 mCi of Tc-77m DTPA aerosol inhalation and 4.2 mCi Tc29m MAA IV COMPARISON:  09/16/2018 chest radiograph. FINDINGS: Ventilation: No focal ventilation defect. Perfusion: Large right upper lung perfusion defect. Moderate left and small right lower lobe perfusion defects. Small left upper lobe perfusion defect. Perfusion defects are mismatch. There is mild heterogeneity of ventilation. No radiographic correlate for perfusion defects. IMPRESSION: Multiple mismatched perfusion defects including a large defect in the right upper lung. High probability of pulmonary embolus. These results will be called to the ordering clinician or representative by the Radiologist Assistant, and communication documented in the PACS or zVision Dashboard. Electronically Signed   By: Kristine Garbe M.D.   On: 09/16/2018 22:18    Cardiac Studies   TTE 09/16/18 LV EF: 45% -   50%  ------------------------------------------------------------------- Indications:      Syncope 780.2.  ------------------------------------------------------------------- History:   PMH:  CKD.  Risk factors:  Hypertension.  ------------------------------------------------------------------- Study Conclusions  - Left ventricle: The cavity size was normal. Systolic function was   mildly reduced.  The estimated ejection fraction was in the range   of 45% to 50%. Diffuse hypokinesis. Although no diagnostic   regional wall motion abnormality was identified, this possibility   cannot be completely excluded on the basis of  this study. - Ventricular septum: The contour showed diastolic flattening and   systolic flattening. These changes are consistent with RV   pressure overload. - Right ventricle: The cavity size was severely dilated. Wall   thickness was normal. Systolic function was severely reduced. - Right atrium: The atrium was massively dilated. There was a   thrombusin the atrial cavity. - Tricuspid valve: There was moderate regurgitation. - Pulmonary arteries: Systolic pressure was severely increased. PA   peak pressure: 59 mm Hg (S).  Urgent and Critical Findings:   A result from an emergently ordered study, RA clot, was reported to Dr. Irish Lack , an attending physician responsible for the patient , by Venida Jarvis , on 09/16/2018 , at 05:34 PM. Correct read-back was verified. Impressions:  - Severely dilated RA and RV with visible clot throughout right   atrium. Highly mobile, concerning for potential embolization.  CT chest wo contrast 09/17/18 1. There is a left hepatic mass measuring 3.4 cm which is indeterminate. Recommend an MRI for further evaluation. 2. Apparent dilated left extrarenal pelvis without ureteral dilatation. Suspected mild hydronephrosis. The findings raise the possibility of a UPJ obstruction. An ultrasound could better evaluate. No stones identified. 3. Subtle respiratory bronchiolitis interstitial lung disease not excluded with tiny subtle ground-glass nodules, particularly in the upper lobes. 4. Polycystic renal disease.  Liver cysts are noted as well. 5. Tiny pleural effusions of uncertain etiology. 6. There is a small amount of fluid in the pelvis which is of uncertain etiology as well.  VQ scan 09/16/18 Multiple mismatched perfusion defects including a large defect in the right upper lung. High probability of pulmonary embolus.   Patient Profile     47 y.o. male with ckd 3, hypertension, asthma, and tobacco abuse, and PCKD who presented post a syncopal  episode. He was noted to be hypotensive and tachycardic at time of syncope. On arrival to ED thought to be in svt so was given adenosine, but patient rather in atrial fibrillation. was found to be He underwent synchronized cardioversion x2 was given adenosine, etomidate. Was started on IV cardizem and heparin prophylactically due to persistent tachycardia. Echocardiogram showed large RA thrombus in and out of the RV. VQ scan highly suggestive of multiple pulmonary embolism. Patient and family elected to not do TPA and rather continue heparin drip. Vascular consulted to do angiovac removal of mass.  Assessment & Plan    Syncope 2/2 Pulmonary Embolism Echo done 11/27 showed lvef 45-50%, diffuse hypokinesis, thrombus in right atrium. Patient is saturating well in the 90s. Next few days of hospitalization will be focused on decreasing thrombus burden.  -TEE to further characterize thrombus  -Vascular to do angiovac to remove thrombus next week -continue IV heparin drip -NPO for TEE  New onset atrial fibrillation with RVR 2/2 PE Patient has converted to normal sinus rhythm. His blood pressure ranging 110-130/90-110s with rates ranging 90-110s.  -Continue IV diltiazem  -TEE scheduled 11/29  CKD III Patient's creatinine 1.93 and gfr 41 from prior 2.1. Will continue to monitor.  Hypertension  Patient's blood pressure ranging 110-130/90-110s with rates ranging 90-110s over the past 24 hrs.      For questions or updates, please contact [place] Please consult [email or web address] for contact info under  Eusebio Friendly, MD  09/18/2018, 8:04 AM    I have examined the patient and reviewed assessment and plan and discussed with patient.  Agree with above as stated.  TEE today.  Patient was seen before the TEE.  Questions answered.  No complaints.  Flat affect.    Continue heparin.  Vascular surgery to weigh in on the possibility of using a suction catheter.  Discussed with Dr.  Sallyanne Kuster post TEE.  Now, the thrombus appears to be partially in the left atrium.  Cardiac surgery has been consulted.  COntinue heparin for now.   Larae Grooms

## 2018-09-18 NOTE — Anesthesia Preprocedure Evaluation (Addendum)
Anesthesia Evaluation  Patient identified by MRN, date of birth, ID band Patient awake    Reviewed: Allergy & Precautions, NPO status , Patient's Chart, lab work & pertinent test results  History of Anesthesia Complications Negative for: history of anesthetic complications  Airway Mallampati: II  TM Distance: >3 FB Neck ROM: Full    Dental  (+) Dental Advisory Given, Teeth Intact   Pulmonary asthma , Current Smoker, PE   breath sounds clear to auscultation       Cardiovascular hypertension, Pt. on medications + dysrhythmias Atrial Fibrillation  Rhythm:Regular Rate:Tachycardia   Right atrial mass  '19 TTE - EF  of 45% to 50%. Diffuse hypokinesis. RV cavity size was severely dilated and systolic function was severely reduced. Right atrium was massively dilated. There was a thrombusin the atrial cavity. Moderate TR. PASP was severely increased, 59 mmHg    Neuro/Psych negative neurological ROS  negative psych ROS   GI/Hepatic negative GI ROS, Neg liver ROS,   Endo/Other  negative endocrine ROS  Renal/GU CRFRenal disease PKD     Musculoskeletal negative musculoskeletal ROS (+)   Abdominal   Peds  Hematology  (+) anemia ,   Anesthesia Other Findings   Reproductive/Obstetrics                           Anesthesia Physical Anesthesia Plan  ASA: III  Anesthesia Plan: MAC   Post-op Pain Management:    Induction: Intravenous  PONV Risk Score and Plan: 1 and Propofol infusion and Treatment may vary due to age or medical condition  Airway Management Planned: Nasal Cannula and Natural Airway  Additional Equipment: None  Intra-op Plan:   Post-operative Plan:   Informed Consent: I have reviewed the patients History and Physical, chart, labs and discussed the procedure including the risks, benefits and alternatives for the proposed anesthesia with the patient or authorized representative who  has indicated his/her understanding and acceptance.     Plan Discussed with: CRNA and Anesthesiologist  Anesthesia Plan Comments:        Anesthesia Quick Evaluation

## 2018-09-18 NOTE — Progress Notes (Signed)
[name] for heparin Indication: atrial fibrillation  Allergies  Allergen Reactions  . Lisinopril Swelling    Lip swelling    Patient Measurements: Height: 5\' 9"  (175.3 cm) Weight: 166 lb 10.7 oz (75.6 kg) IBW/kg (Calculated) : 70.7 Heparin Dosing Weight: 77kg  Vital Signs: Temp: 98.9 F (37.2 C) (11/29 0400) Temp Source: Oral (11/29 0400) BP: 123/77 (11/29 0600) Pulse Rate: 93 (11/29 0600)  Labs: Recent Labs    09/16/18 1047  09/17/18 0419 09/17/18 1340 09/17/18 2044 09/18/18 0434  HGB 14.6  --  12.5*  --   --  12.5*  HCT 45.9  --  36.9*  --   --  37.9*  PLT 179  --  158  --   --  175  HEPARINUNFRC  --    < > 0.17* 0.92* 0.48 0.44  CREATININE 2.10*  --   --   --   --  1.93*   < > = values in this interval not displayed.    Estimated Creatinine Clearance: 48.3 mL/min (A) (by C-G formula based on SCr of 1.93 mg/dL (H)).  Assessment: 47 yo m presenting with acute PE diagnosed by VQ, large mobile mass in RA. Pharmacy consulted to dose heparin. Not on anticoagulation PTA.  Heparin level therapeutic at 0.44. CBC stable. No bleed or infusion issues documented.  Goal of Therapy:  Heparin level 0.3-0.7 units/ml Monitor platelets by anticoagulation protocol: Yes   Plan:  Continue heparin at 1200 units/hr Monitor daily heparin level and CBC, s/sx bleeding F/U plans for Oral AC F/U IR removal of mobile mass  Isaias Sakai, Sherian Rein D PGY1 Pharmacy Resident  Phone [phone] Please use AMION for clinical pharmacists numbers  09/18/2018      6:58 AM

## 2018-09-18 NOTE — Progress Notes (Signed)
    [facility] has been requested to perform a transesophageal echocardiogram on 09/18/2018 for atrial mass vs clot and PE.  After careful review of history and examination, the risks and benefits of transesophageal echocardiogram have been explained including risks of esophageal damage, perforation (1:10,000 risk), bleeding, pharyngeal hematoma as well as other potential complications associated with conscious sedation including aspiration, arrhythmia, respiratory failure and death. Alternatives to treatment were discussed, questions were answered. Patient is willing to proceed.   47 yo male presented with syncope. Found to have R atrial mass vs clot on Echo. VQ scan positive. CT showed 3.4 cm left liver mass. Pending TEE to decide whether R atrial mass is clot. Vitals stable. Platelet normal.   Almyra Deforest, PA-C 09/18/2018 9:49 AM

## 2018-09-18 NOTE — Progress Notes (Signed)
   NAMERemus Sloan, MRN:  [id number], DOB:  01/13/1971, LOS: 2 ADMISSION DATE:  09/16/2018, CONSULTATION DATE:  09/16/2018 REFERRING MD:  Cardiology MD - [name], CHIEF COMPLAINT:  Mobile RA clot   Brief History   47 year old male with PMH of HTN and polycystic kidney who presents to [facility] via cardiology with the chief complaint of syncope.  Patient was picking up hi smedications from the pharmacy when he had a syncopal episode.  EMS was called and the patient was noted to be hypotensive at the time and tachycardic.  Patient was brought to the ED and hypotension resolved with IVF and no O2 demand was noted.  The patient seems rather irritated and did not really answer questions.  Rest of the history is from the chart.   Past Medical History  HTN, PCK, Asthma  Significant Hospital Events   11/27 > admit  Consults:  PCCM Vascular surgery  Procedures:    Significant Diagnostic Tests:  TTE 11/27 > EF 45-50%, RV overload with severe dilation, RA dilation with thrombus in atrial cavity, mod TR, PAP 59. VQ 11/27 > multiple mismatched perfusion defects. CT chest 11/28 > hepatic mass measuring 3.5cm, dilated left extrarenal pelvis, PCKD. TEE 11/29 >   Micro Data:  N/A  Antimicrobials:  N/A   Interim history/subjective:  No distress.  Flat affect.  Objective   Blood pressure (!) 137/100, pulse 87, temperature 98.3 F (36.8 C), temperature source Oral, resp. rate 16, height 5\' 9"  (1.753 m), weight 75.6 kg, SpO2 96 %.        Intake/Output Summary (Last 24 hours) at 09/18/2018 1041 Last data filed at 09/18/2018 1000 Gross per 24 hour  Intake 577.52 ml  Output 1125 ml  Net -547.48 ml   Filed Weights   09/16/18 1143 09/16/18 2230  Weight: 77.3 kg 75.6 kg    Examination: General: Adult male, resting in bed, in NAD. Neuro: A&O x 3, no deficits. HEENT: [place]/AT. Sclerae anicteric. EOMI. Cardiovascular: RRR, no M/R/G.  Lungs: Respirations even and unlabored.  CTA  bilaterally, No W/R/R. Abdomen: BS x 4, soft, NT/ND.  Musculoskeletal: No gross deformities, no edema.  Skin: Intact, warm, no rashes.   Assessment & Plan:   Acute Pulmonary Emboli (Per VQ scan) w/ large mobile mass in the RA - Seen by vascular surgery, concerned that he may have an atrial mass, and clot propagated from this mass resulting in the pulmonary emboli. Plan Cont current anticoagulation W/ IV heparin Cardiology and vascular surgery following F/u on TEE scheduled for today 11/29 Vascular to f/u after TEE, pt possibly a candidate for angiovac removal of mass  A.fib with RVR. Plan Continue heparin and diltiazem  Liver mass. Plan MRI abdomen once PE / RA clot issues have been addressed  Hx of EtOH abuse drinks 1/5 a day. Plan CIWA protocol Thiamine / Folate  Chronic kidney disease with history of polycystic kidney disease. Plan Trend chemistry Strict intake output  H/o asthma. Plan PRN bronchodilators  HTN. Plan Telemetry monitoring  Anemia (macrocytic). Plan Monitor for bleeding Follow CBC and transfuse for Hgb < 7   [name], [place] [facility] Pager: [phone]  or [phone] 09/18/2018, 10:49 AM

## 2018-09-18 NOTE — Interval H&P Note (Signed)
Cath Lab Visit (complete for each Cath Lab visit)  Clinical Evaluation Leading to the Procedure:   ACS: Yes.    Non-ACS:    Anginal Classification: CCS IV  Anti-ischemic medical therapy: Minimal Therapy (1 class of medications)  Non-Invasive Test Results: No non-invasive testing performed  Prior CABG: No previous CABG   Planned diagnostic cath only.   History and Physical Interval Note:  09/18/2018 4:02 PM  [name]  has presented today for surgery, with the diagnosis of surgical clearance  The various methods of treatment have been discussed with the patient and family. After consideration of risks, benefits and other options for treatment, the patient has consented to  Procedure(s): LEFT HEART CATH AND CORONARY ANGIOGRAPHY (N/A) as a surgical intervention .  The patient's history has been reviewed, patient examined, no change in status, stable for surgery.  I have reviewed the patient's chart and labs.  Questions were answered to the patient's satisfaction.     Larae Grooms

## 2018-09-18 NOTE — Interval H&P Note (Signed)
History and Physical Interval Note:  09/18/2018 10:53 AM  [name]  has presented today for surgery, with the diagnosis of right atrial mass  The various methods of treatment have been discussed with the patient and family. After consideration of risks, benefits and other options for treatment, the patient has consented to  Procedure(s): TRANSESOPHAGEAL ECHOCARDIOGRAM (TEE) (N/A) as a surgical intervention .  The patient's history has been reviewed, patient examined, no change in status, stable for surgery.  I have reviewed the patient's chart and labs.  Questions were answered to the patient's satisfaction.     [name]

## 2018-09-18 NOTE — Progress Notes (Signed)
  Echocardiogram Echocardiogram Transesophageal has been performed.  [name] [name] 09/18/2018, 12:07 PM

## 2018-09-18 NOTE — Progress Notes (Signed)
    Received word from Dr. Lucianne Lei Trigt's PA that cardiac cath would be needed before any cardiac surgery.    I discussed the risks of the procedure with the patient and family.  He is not happy about having to stay NPO, but is agreeable.  All questions answered.  Cardiac catheterization was discussed with the patient fully. The patient understands that risks include but are not limited to stroke (1 in 1000), death (1 in 62), kidney failure [usually temporary] (1 in 500), bleeding (1 in 200), allergic reaction [possibly serious] (1 in 200).  The patient understands and is willing to proceed.    Jettie Booze, MD

## 2018-09-18 NOTE — Op Note (Signed)
INDICATIONS: RIGHT ATRIAL MASS  PROCEDURE:   Informed consent was obtained prior to the procedure. The risks, benefits and alternatives for the procedure were discussed and the patient comprehended these risks.  Risks include, but are not limited to, cough, sore throat, vomiting, nausea, somnolence, esophageal and stomach trauma or perforation, bleeding, low blood pressure, aspiration, pneumonia, infection, trauma to the teeth and death.    After a procedural time-out, the oropharynx was anesthetized with 20% benzocaine spray.   During this procedure the patient was administered IV propofol, Dr. Fransisco Beau Anesthesiology.  The transesophageal probe was inserted in the esophagus and stomach without difficulty and multiple views were obtained.  The patient was kept under observation until the patient left the procedure room.  The patient left the procedure room in stable condition.   Agitated microbubble saline contrast was not administered.  COMPLICATIONS:    There were no immediate complications.  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 Approximately 2/3 of the thrombus is in the left atrium and prolapses across the mitral valve into the left ventricle. The thrombus does not appear to be connected to either the superior or the inferior vena cava. Moderately depressed right ventricular function, McConnell's sign present, consistent with recent pulmonary embolism. Normal mitral, aortic, pulmonary valves, normal thoracic aorta.  RECOMMENDATIONS:    Emergency CVTS consultation. Both CV surgeons are currently in the operating room. Have left message for urgent call.  Time Spent Directly with the Patient:  30 minutes   [name] 09/18/2018, 12:06 PM

## 2018-09-18 NOTE — Progress Notes (Signed)
   Noted plans for sternotomy and removal of atrial thrombus. Will be available as needed.   Kelley Polinsky C. Donzetta Matters, MD [facility] Office: [phone] Pager: [phone]

## 2018-09-18 NOTE — H&P (View-Only) (Signed)
Progress Note  Patient Name: [name] Date of Encounter: 09/18/2018  Primary Cardiologist: No primary care provider on file.   Subjective   [name] was seen resting in his bed this morning. He does not seem to understand the gravity of the situation and has been refusing numerous procedures.   Inpatient Medications    Scheduled Meds: . diltiazem  15 mg Intravenous Once  . folic acid  1 mg Intravenous Daily  . thiamine injection  100 mg Intravenous Daily   Continuous Infusions: . diltiazem (CARDIZEM) infusion 5 mg/hr (09/17/18 1444)  . heparin 1,200 Units/hr (09/18/18 0011)   PRN Meds: LORazepam   Vital Signs    Vitals:   09/18/18 0400 09/18/18 0500 09/18/18 0600 09/18/18 0700  BP: (!) 131/104 (!) 120/108 123/77 (!) 131/119  Pulse: 95 93 93 92  Resp: 20 19 18 18   Temp: 98.9 F (37.2 C)     TempSrc: Oral     SpO2: 95% 94% 95% 96%  Weight:      Height:        Intake/Output Summary (Last 24 hours) at 09/18/2018 0804 Last data filed at 09/18/2018 0800 Gross per 24 hour  Intake 533.58 ml  Output 1125 ml  Net -591.42 ml   Filed Weights   09/16/18 1143 09/16/18 2230  Weight: 77.3 kg 75.6 kg    Telemetry    NSR - Personally Reviewed  ECG    None today - Personally Reviewed  Physical Exam   Physical Exam  Constitutional: Appears well-developed and well-nourished. No distress.  HENT:  Head: Normocephalic and atraumatic.  Eyes: Conjunctivae are normal.  Cardiovascular: Normal rate, regular rhythm and normal heart sounds.  Respiratory: Effort normal and breath sounds normal. No respiratory distress. Mild wheezes diffuse. GI: Soft. Bowel sounds are normal. No distension. There is no tenderness.  Musculoskeletal: No edema.  Neurological: Is alert.  Skin: Not diaphoretic. No erythema.  Psychiatric: Normal mood and affect. Behavior is normal. Judgment and thought content normal.    Labs    Chemistry Recent Labs  Lab 09/16/18 1047  09/18/18 0434  NA 139 137  K 3.7 3.6  CL 109 110  CO2 21* 21*  GLUCOSE 114* 101*  BUN 18 19  CREATININE 2.10* 1.93*  CALCIUM 8.3* 8.4*  GFRNONAA 37* 41*  GFRAA 43* 47*  ANIONGAP 9 6     Hematology Recent Labs  Lab 09/16/18 1047 09/17/18 0419 09/18/18 0434  WBC 5.8 5.2 4.3  RBC 4.31 3.59* 3.71*  HGB 14.6 12.5* 12.5*  HCT 45.9 36.9* 37.9*  MCV 106.5* 102.8* 102.2*  MCH 33.9 34.8* 33.7  MCHC 31.8 33.9 33.0  RDW 13.1 13.1 12.7  PLT 179 158 175    Cardiac EnzymesNo results for input(s): TROPONINI in the last 168 hours.  Recent Labs  Lab 09/16/18 1058  TROPIPOC 0.20*     BNPNo results for input(s): BNP, PROBNP in the last 168 hours.   DDimer  Recent Labs  Lab 09/16/18 1812  DDIMER >20.00*     Radiology    Ct Abdomen Pelvis Wo Contrast  Result Date: 09/17/2018 CLINICAL DATA:  Right atrial mass.  Evaluate for other pathology. EXAM: CT CHEST, ABDOMEN AND PELVIS WITHOUT CONTRAST TECHNIQUE: Multidetector CT imaging of the chest, abdomen and pelvis was performed following the standard protocol without IV contrast. COMPARISON:  None. FINDINGS: CT CHEST FINDINGS Cardiovascular: The heart size is borderline. The reported atrial mass cannot be assessed on this study without contrast. Evaluation of the  thoracic aorta is also limited without contrast but no aneurysm or atherosclerotic change is noted. The main pulmonary arteries are unremarkable. Mediastinum/Nodes: The thyroid and esophagus are unremarkable. No adenopathy. Tiny pleural effusions. No pericardial effusion. No other acute abnormalities in the mediastinum. Lungs/Pleura: Paraseptal emphysematous changes and scarring in the right apex. Foci of air to the right of the trachea and esophagus likely represent diverticula of no acute significance. Central airways are otherwise normal. No pneumothorax. Small bilateral pleural effusions. No suspicious nodules or masses. Subtle tiny ground-glass nodules, particularly in the  upper lobes may be centrilobular suggesting the possibility of subtle respiratory bronchiolitis interstitial lung disease given the patient's smoking history. Musculoskeletal: See below. CT ABDOMEN PELVIS FINDINGS Hepatobiliary: Multiple hepatic cysts are noted. There is a 3.4 cm mass in the left hepatic lobe on axial image 52 which does not appear to be cystic. The gallbladder is unremarkable on limited images. Pancreas: Unremarkable. No pancreatic ductal dilatation or surrounding inflammatory changes. Spleen: Normal in size without focal abnormality. Adrenals/Urinary Tract: Adrenal glands are normal. Innumerable simple and hyperdense cysts throughout both kidneys. Probable parapelvic cysts on the right. A fluid-filled structure on the left as seen on coronal image 73 is likely a dilated left renal pelvis. I also suspect mild hydronephrosis. The ureters are normal in caliber. The bladder is normal. Stomach/Bowel: The stomach and small bowel are normal. The colon is unremarkable. The appendix is normal. Vascular/Lymphatic: No significant vascular findings are present. No enlarged abdominal or pelvic lymph nodes. Reproductive: Prostate is unremarkable. Other: There is a small amount of free fluid in the pelvis. Musculoskeletal: No acute or significant osseous findings. IMPRESSION: 1. There is a left hepatic mass measuring 3.4 cm which is indeterminate. Recommend an MRI for further evaluation. 2. Apparent dilated left extrarenal pelvis without ureteral dilatation. Suspected mild hydronephrosis. The findings raise the possibility of a UPJ obstruction. An ultrasound could better evaluate. No stones identified. 3. Subtle respiratory bronchiolitis interstitial lung disease not excluded with tiny subtle ground-glass nodules, particularly in the upper lobes. 4. Polycystic renal disease.  Liver cysts are noted as well. 5. Tiny pleural effusions of uncertain etiology. 6. There is a small amount of fluid in the pelvis which is  of uncertain etiology as well. Electronically Signed   By: Dorise Bullion III M.D   On: 09/17/2018 16:14   Dg Chest 2 View  Result Date: 09/16/2018 CLINICAL DATA:  Recurrent syncope today. Tachycardia. History of hypertension. EXAM: CHEST - 2 VIEW COMPARISON:  Chest radiograph January 14, 2011 FINDINGS: Cardiac silhouette is upper limits of normal size, mediastinal silhouette is unremarkable. No pleural effusion or focal consolidation. No pneumothorax. Soft tissue planes and included osseous structures are non suspicious. IMPRESSION: Borderline cardiomegaly.  No acute pulmonary process. Electronically Signed   By: Elon Alas M.D.   On: 09/16/2018 14:29   Ct Chest Wo Contrast  Result Date: 09/17/2018 CLINICAL DATA:  Right atrial mass.  Evaluate for other pathology. EXAM: CT CHEST, ABDOMEN AND PELVIS WITHOUT CONTRAST TECHNIQUE: Multidetector CT imaging of the chest, abdomen and pelvis was performed following the standard protocol without IV contrast. COMPARISON:  None. FINDINGS: CT CHEST FINDINGS Cardiovascular: The heart size is borderline. The reported atrial mass cannot be assessed on this study without contrast. Evaluation of the thoracic aorta is also limited without contrast but no aneurysm or atherosclerotic change is noted. The main pulmonary arteries are unremarkable. Mediastinum/Nodes: The thyroid and esophagus are unremarkable. No adenopathy. Tiny pleural effusions. No pericardial effusion. No other  acute abnormalities in the mediastinum. Lungs/Pleura: Paraseptal emphysematous changes and scarring in the right apex. Foci of air to the right of the trachea and esophagus likely represent diverticula of no acute significance. Central airways are otherwise normal. No pneumothorax. Small bilateral pleural effusions. No suspicious nodules or masses. Subtle tiny ground-glass nodules, particularly in the upper lobes may be centrilobular suggesting the possibility of subtle respiratory bronchiolitis  interstitial lung disease given the patient's smoking history. Musculoskeletal: See below. CT ABDOMEN PELVIS FINDINGS Hepatobiliary: Multiple hepatic cysts are noted. There is a 3.4 cm mass in the left hepatic lobe on axial image 52 which does not appear to be cystic. The gallbladder is unremarkable on limited images. Pancreas: Unremarkable. No pancreatic ductal dilatation or surrounding inflammatory changes. Spleen: Normal in size without focal abnormality. Adrenals/Urinary Tract: Adrenal glands are normal. Innumerable simple and hyperdense cysts throughout both kidneys. Probable parapelvic cysts on the right. A fluid-filled structure on the left as seen on coronal image 73 is likely a dilated left renal pelvis. I also suspect mild hydronephrosis. The ureters are normal in caliber. The bladder is normal. Stomach/Bowel: The stomach and small bowel are normal. The colon is unremarkable. The appendix is normal. Vascular/Lymphatic: No significant vascular findings are present. No enlarged abdominal or pelvic lymph nodes. Reproductive: Prostate is unremarkable. Other: There is a small amount of free fluid in the pelvis. Musculoskeletal: No acute or significant osseous findings. IMPRESSION: 1. There is a left hepatic mass measuring 3.4 cm which is indeterminate. Recommend an MRI for further evaluation. 2. Apparent dilated left extrarenal pelvis without ureteral dilatation. Suspected mild hydronephrosis. The findings raise the possibility of a UPJ obstruction. An ultrasound could better evaluate. No stones identified. 3. Subtle respiratory bronchiolitis interstitial lung disease not excluded with tiny subtle ground-glass nodules, particularly in the upper lobes. 4. Polycystic renal disease.  Liver cysts are noted as well. 5. Tiny pleural effusions of uncertain etiology. 6. There is a small amount of fluid in the pelvis which is of uncertain etiology as well. Electronically Signed   By: Dorise Bullion III M.D   On:  09/17/2018 16:14   Nm Pulmonary Perf And Vent  Result Date: 09/16/2018 CLINICAL DATA:  47 y/o  M; PE suspected, high pretest probability. EXAM: NUCLEAR MEDICINE VENTILATION - PERFUSION LUNG SCAN TECHNIQUE: Ventilation images were obtained in multiple projections using inhaled aerosol Tc-38m DTPA. Perfusion images were obtained in multiple projections after intravenous injection of Tc-93m MAA. RADIOPHARMACEUTICALS:  31.0 mCi of Tc-44m DTPA aerosol inhalation and 4.2 mCi Tc42m MAA IV COMPARISON:  09/16/2018 chest radiograph. FINDINGS: Ventilation: No focal ventilation defect. Perfusion: Large right upper lung perfusion defect. Moderate left and small right lower lobe perfusion defects. Small left upper lobe perfusion defect. Perfusion defects are mismatch. There is mild heterogeneity of ventilation. No radiographic correlate for perfusion defects. IMPRESSION: Multiple mismatched perfusion defects including a large defect in the right upper lung. High probability of pulmonary embolus. These results will be called to the ordering clinician or representative by the Radiologist Assistant, and communication documented in the PACS or zVision Dashboard. Electronically Signed   By: Kristine Garbe M.D.   On: 09/16/2018 22:18    Cardiac Studies   TTE 09/16/18 LV EF: 45% -   50%  ------------------------------------------------------------------- Indications:      Syncope 780.2.  ------------------------------------------------------------------- History:   PMH:  CKD.  Risk factors:  Hypertension.  ------------------------------------------------------------------- Study Conclusions  - Left ventricle: The cavity size was normal. Systolic function was   mildly reduced.  The estimated ejection fraction was in the range   of 45% to 50%. Diffuse hypokinesis. Although no diagnostic   regional wall motion abnormality was identified, this possibility   cannot be completely excluded on the basis of  this study. - Ventricular septum: The contour showed diastolic flattening and   systolic flattening. These changes are consistent with RV   pressure overload. - Right ventricle: The cavity size was severely dilated. Wall   thickness was normal. Systolic function was severely reduced. - Right atrium: The atrium was massively dilated. There was a   thrombusin the atrial cavity. - Tricuspid valve: There was moderate regurgitation. - Pulmonary arteries: Systolic pressure was severely increased. PA   peak pressure: 59 mm Hg (S).  Urgent and Critical Findings:   A result from an emergently ordered study, RA clot, was reported to Dr. Irish Lack , an attending physician responsible for the patient , by Venida Jarvis , on 09/16/2018 , at 05:34 PM. Correct read-back was verified. Impressions:  - Severely dilated RA and RV with visible clot throughout right   atrium. Highly mobile, concerning for potential embolization.  CT chest wo contrast 09/17/18 1. There is a left hepatic mass measuring 3.4 cm which is indeterminate. Recommend an MRI for further evaluation. 2. Apparent dilated left extrarenal pelvis without ureteral dilatation. Suspected mild hydronephrosis. The findings raise the possibility of a UPJ obstruction. An ultrasound could better evaluate. No stones identified. 3. Subtle respiratory bronchiolitis interstitial lung disease not excluded with tiny subtle ground-glass nodules, particularly in the upper lobes. 4. Polycystic renal disease.  Liver cysts are noted as well. 5. Tiny pleural effusions of uncertain etiology. 6. There is a small amount of fluid in the pelvis which is of uncertain etiology as well.  VQ scan 09/16/18 Multiple mismatched perfusion defects including a large defect in the right upper lung. High probability of pulmonary embolus.   Patient Profile     47 y.o. male with ckd 3, hypertension, asthma, and tobacco abuse, and PCKD who presented post a syncopal  episode. He was noted to be hypotensive and tachycardic at time of syncope. On arrival to ED thought to be in svt so was given adenosine, but patient rather in atrial fibrillation. was found to be He underwent synchronized cardioversion x2 was given adenosine, etomidate. Was started on IV cardizem and heparin prophylactically due to persistent tachycardia. Echocardiogram showed large RA thrombus in and out of the RV. VQ scan highly suggestive of multiple pulmonary embolism. Patient and family elected to not do TPA and rather continue heparin drip. Vascular consulted to do angiovac removal of mass.  Assessment & Plan    Syncope 2/2 Pulmonary Embolism Echo done 11/27 showed lvef 45-50%, diffuse hypokinesis, thrombus in right atrium. Patient is saturating well in the 90s. Next few days of hospitalization will be focused on decreasing thrombus burden.  -TEE to further characterize thrombus  -Vascular to do angiovac to remove thrombus next week -continue IV heparin drip -NPO for TEE  New onset atrial fibrillation with RVR 2/2 PE Patient has converted to normal sinus rhythm. His blood pressure ranging 110-130/90-110s with rates ranging 90-110s.  -Continue IV diltiazem  -TEE scheduled 11/29  CKD III Patient's creatinine 1.93 and gfr 41 from prior 2.1. Will continue to monitor.  Hypertension  Patient's blood pressure ranging 110-130/90-110s with rates ranging 90-110s over the past 24 hrs.      For questions or updates, please contact [place] Please consult [email or web address] for contact info under  Eusebio Friendly, MD  09/18/2018, 8:04 AM    I have examined the patient and reviewed assessment and plan and discussed with patient.  Agree with above as stated.  TEE today.  Patient was seen before the TEE.  Questions answered.  No complaints.  Flat affect.    Continue heparin.  Vascular surgery to weigh in on the possibility of using a suction catheter.  Discussed with Dr.  Sallyanne Kuster post TEE.  Now, the thrombus appears to be partially in the left atrium.  Cardiac surgery has been consulted.  COntinue heparin for now.   Larae Grooms

## 2018-09-18 NOTE — Consult Note (Addendum)
[address]       [place] [address]             [phone]        [name] [place] Medical Record #[id number] Date of Birth: Mar 05, 1971  Referring: Dr. Sallyanne Kuster Primary Care: Delice Bison, DO Primary Cardiologist:No primary care provider on file.  Chief Complaint:     Large Thrombus/PFO  Chief Complaint  Patient presents with  . Tachycardia  . Loss of Consciousness   History of Present Illness:      Mr. [name] is a 47 yo AA male with known history of Hypertension, tobacco abuse 1 ppd, asthma and CKD.  He was at the pharmacy picking up his medication and suffered a syncopal episode.  EMS was called and on arrival the patient was found to be hypotensive.  The patient stood up and suffered an additional syncopal episode.  EKG showed the patient to be in Atrial Fibrillation/SVT.  In the ED he underwent Cardioversion x 2.  He was treated with a liter of saline with minimal improvement of his BP.  The patient was placed on IV Cardizem and heparin prophylactically.  CT scan was obtained and showed no evidence of pulmonary embolism.  There was a left hepatic mass measuring 3.4 cm.  Echocardiogram was obtained and showed a right atrial clot.  Vascular surgery was consulted for possible angiovac removal.  The patient was evaluated by Dr. Donzetta Matters who felt patient would require a TEE.  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  Approximately 2/3 of the thrombus is in the left atrium and prolapses across the mitral valve into the left ventricle.  Cardiology is requesting an emergent cardiothoracic consult.  Currently the patient is asymptomatic.  He is unpleasant overall and denies chest pain and shortness of breath.  He admits to smoking 1 ppd cigarettes he denies drug use.  He asked why I am speaking to him if this isn't being done right now.  Current Activity/ Functional Status: Patient is independent with  mobility/ambulation, transfers, ADL's, IADL's.   Zubrod Score: At the time of surgery this patient's most appropriate activity status/level should be described as: [x]     0    Normal activity, no symptoms []     1    Restricted in physical strenuous activity but ambulatory, able to do out light work []     2    Ambulatory and capable of self care, unable to do work activities, up and about                 more than 50%  Of the time                            []     3    Only limited self care, in bed greater than 50% of waking hours []     4    Completely disabled, no self care, confined to bed or chair []     5    Moribund  Past Medical History:  Diagnosis Date  . Asthma   . Chronic kidney disease   . Eczema herpeticum   . Erythroderma   . Erythroderma   . Hypertension   . Wears glasses     Past Surgical History:  Procedure Laterality Date  . EYE SURGERY  1976  . HERNIA REPAIR  Social History   Tobacco Use  Smoking Status Current Every Day Smoker  . Packs/day: 1.00  Smokeless Tobacco Never Used    Social History   Substance and Sexual Activity  Alcohol Use Yes   Comment: OCCASIONALLY     Allergies  Allergen Reactions  . Lisinopril Swelling    Lip swelling    Current Facility-Administered Medications  Medication Dose Route Frequency Provider Last Rate Last Dose  . diltiazem (CARDIZEM) 1 mg/mL load via infusion 15 mg  15 mg Intravenous Once Mesner, Corene Cornea, MD   Stopped at 09/16/18 1217   And  . diltiazem (CARDIZEM) 100 mg in dextrose 5% 184mL (1 mg/mL) infusion  5-15 mg/hr Intravenous Continuous Mesner, Jason, MD 5 mL/hr at 09/18/18 1237 5 mg/hr at 09/18/18 1237  . folic acid injection 1 mg  1 mg Intravenous Daily Erick Colace, NP   1 mg at 09/18/18 1035  . heparin ADULT infusion 100 units/mL (25000 units/251mL sodium chloride 0.45%)  1,200 Units/hr Intravenous Continuous Wynell Balloon, RPH 12 mL/hr at 09/18/18 0011 1,200 Units/hr at 09/18/18 0011  .  LORazepam (ATIVAN) injection 2-3 mg  2-3 mg Intravenous Q1H PRN Erick Colace, NP      . thiamine (B-1) injection 100 mg  100 mg Intravenous Daily Erick Colace, NP   100 mg at 09/18/18 1035    Medications Prior to Admission  Medication Sig Dispense Refill Last Dose  . albuterol (PROVENTIL HFA;VENTOLIN HFA) 108 (90 BASE) MCG/ACT inhaler Inhale 1-2 puffs into the lungs every 6 (six) hours as needed for wheezing or shortness of breath.   09/15/2018 at Unknown time  . Dupilumab (DUPIXENT [place]) Inject 1 Syringe into the skin every 14 (fourteen) days.   09/04/2018  . losartan-hydrochlorothiazide (HYZAAR) 100-25 MG tablet Take 1 tablet by mouth daily. 30 tablet 3 09/15/2018 at Unknown time    Family History  Problem Relation Age of Onset  . Asthma Father   . Kidney disease Father   . Eczema Sister    Review of Systems:   ROS Constitutional: positive for syncopal episode on admission Respiratory: positive for asthma Cardiovascular: positive for irregular heart beat and palpitations, negative for chest pain, chest pressure/discomfort, dyspnea and exertional chest pressure/discomfort Gastrointestinal: negative Neurological: negative     Cardiac Review of Systems: Y or  [    ]= no  Chest Pain [ N ]  Resting SOB [ N ] Exertional SOB  [  ]  Orthopnea [  ]   Pedal Edema [   ]    Palpitations [ Y ] Syncope  [Y]   Presyncope [   ]  General Review of Systems: [Y] = yes [  ]=no Constitional: recent weight change [  ]; anorexia [  ]; fatigue [  ]; nausea [  ]; night sweats [  ]; fever [  ]; or chills [  ]                                                               Dental: Last Dentist visit:   Eye : blurred vision [  ]; diplopia [   ]; vision changes [  ];  Amaurosis fugax[  ]; Resp: cough [  ];  wheezing[  ];  hemoptysis[  ];  shortness of breath[ N ]; paroxysmal nocturnal dyspnea[  ]; dyspnea on exertion[ N ]; or orthopnea[  ];  GI:  gallstones[  ], vomiting[  ];  dysphagia[  ]; melena[  ];   hematochezia [  ]; heartburn[  ];   Hx of  Colonoscopy[  ]; GU: kidney stones [  ]; hematuria[  ];   dysuria [  ];  nocturia[  ];  history of     obstruction [  ]; urinary frequency [  ]             Skin: rash, swelling[  ];, hair loss[  ];  peripheral edema[  ];  or itching[  ]; Musculosketetal: myalgias[  ];  joint swelling[  ];  joint erythema[  ];  joint pain[  ];  back pain[  ];  Heme/Lymph: bruising[  ];  bleeding[  ];  anemia[  ];  Neuro: TIA[  ];  headaches[  ];  stroke[  ];  vertigo[  ];  seizures[  ];   paresthesias[  ];  difficulty walking[  ];  Psych:depression[  ]; anxiety[  ];  Endocrine: diabetes[ N ];  thyroid dysfunction[N  ];  Physical Exam: BP 126/88   Pulse (!) 8   Temp 98.5 F (36.9 C) (Oral)   Resp (!) 25   Ht 5\' 9"  (1.753 m)   Wt 75.6 kg   SpO2 95%   BMI 24.61 kg/m   General appearance: alert, cooperative and no distress Head: Normocephalic, without obvious abnormality, atraumatic Resp: clear to auscultation bilaterally Cardio: regular rate and rhythm GI: soft, non-tender; bowel sounds normal; no masses,  no organomegaly Extremities: extremities normal, atraumatic, no cyanosis or edema Neurologic: Grossly normal  Diagnostic Studies & Laboratory data:     Recent Radiology Findings:   Ct Abdomen Pelvis Wo Contrast  Result Date: 09/17/2018 CLINICAL DATA:  Right atrial mass.  Evaluate for other pathology. EXAM: CT CHEST, ABDOMEN AND PELVIS WITHOUT CONTRAST TECHNIQUE: Multidetector CT imaging of the chest, abdomen and pelvis was performed following the standard protocol without IV contrast. COMPARISON:  None. FINDINGS: CT CHEST FINDINGS Cardiovascular: The heart size is borderline. The reported atrial mass cannot be assessed on this study without contrast. Evaluation of the thoracic aorta is also limited without contrast but no aneurysm or atherosclerotic change is noted. The main pulmonary arteries are unremarkable. Mediastinum/Nodes: The thyroid and esophagus  are unremarkable. No adenopathy. Tiny pleural effusions. No pericardial effusion. No other acute abnormalities in the mediastinum. Lungs/Pleura: Paraseptal emphysematous changes and scarring in the right apex. Foci of air to the right of the trachea and esophagus likely represent diverticula of no acute significance. Central airways are otherwise normal. No pneumothorax. Small bilateral pleural effusions. No suspicious nodules or masses. Subtle tiny ground-glass nodules, particularly in the upper lobes may be centrilobular suggesting the possibility of subtle respiratory bronchiolitis interstitial lung disease given the patient's smoking history. Musculoskeletal: See below. CT ABDOMEN PELVIS FINDINGS Hepatobiliary: Multiple hepatic cysts are noted. There is a 3.4 cm mass in the left hepatic lobe on axial image 52 which does not appear to be cystic. The gallbladder is unremarkable on limited images. Pancreas: Unremarkable. No pancreatic ductal dilatation or surrounding inflammatory changes. Spleen: Normal in size without focal abnormality. Adrenals/Urinary Tract: Adrenal glands are normal. Innumerable simple and hyperdense cysts throughout both kidneys. Probable parapelvic cysts on the right. A fluid-filled structure on the left as seen on coronal image 73 is likely a dilated left renal pelvis. I also suspect  mild hydronephrosis. The ureters are normal in caliber. The bladder is normal. Stomach/Bowel: The stomach and small bowel are normal. The colon is unremarkable. The appendix is normal. Vascular/Lymphatic: No significant vascular findings are present. No enlarged abdominal or pelvic lymph nodes. Reproductive: Prostate is unremarkable. Other: There is a small amount of free fluid in the pelvis. Musculoskeletal: No acute or significant osseous findings. IMPRESSION: 1. There is a left hepatic mass measuring 3.4 cm which is indeterminate. Recommend an MRI for further evaluation. 2. Apparent dilated left extrarenal  pelvis without ureteral dilatation. Suspected mild hydronephrosis. The findings raise the possibility of a UPJ obstruction. An ultrasound could better evaluate. No stones identified. 3. Subtle respiratory bronchiolitis interstitial lung disease not excluded with tiny subtle ground-glass nodules, particularly in the upper lobes. 4. Polycystic renal disease.  Liver cysts are noted as well. 5. Tiny pleural effusions of uncertain etiology. 6. There is a small amount of fluid in the pelvis which is of uncertain etiology as well. Electronically Signed   By: Dorise Bullion III M.D   On: 09/17/2018 16:14   Dg Chest 2 View  Result Date: 09/16/2018 CLINICAL DATA:  Recurrent syncope today. Tachycardia. History of hypertension. EXAM: CHEST - 2 VIEW COMPARISON:  Chest radiograph January 14, 2011 FINDINGS: Cardiac silhouette is upper limits of normal size, mediastinal silhouette is unremarkable. No pleural effusion or focal consolidation. No pneumothorax. Soft tissue planes and included osseous structures are non suspicious. IMPRESSION: Borderline cardiomegaly.  No acute pulmonary process. Electronically Signed   By: Elon Alas M.D.   On: 09/16/2018 14:29   Ct Chest Wo Contrast  Result Date: 09/17/2018 CLINICAL DATA:  Right atrial mass.  Evaluate for other pathology. EXAM: CT CHEST, ABDOMEN AND PELVIS WITHOUT CONTRAST TECHNIQUE: Multidetector CT imaging of the chest, abdomen and pelvis was performed following the standard protocol without IV contrast. COMPARISON:  None. FINDINGS: CT CHEST FINDINGS Cardiovascular: The heart size is borderline. The reported atrial mass cannot be assessed on this study without contrast. Evaluation of the thoracic aorta is also limited without contrast but no aneurysm or atherosclerotic change is noted. The main pulmonary arteries are unremarkable. Mediastinum/Nodes: The thyroid and esophagus are unremarkable. No adenopathy. Tiny pleural effusions. No pericardial effusion. No other  acute abnormalities in the mediastinum. Lungs/Pleura: Paraseptal emphysematous changes and scarring in the right apex. Foci of air to the right of the trachea and esophagus likely represent diverticula of no acute significance. Central airways are otherwise normal. No pneumothorax. Small bilateral pleural effusions. No suspicious nodules or masses. Subtle tiny ground-glass nodules, particularly in the upper lobes may be centrilobular suggesting the possibility of subtle respiratory bronchiolitis interstitial lung disease given the patient's smoking history. Musculoskeletal: See below. CT ABDOMEN PELVIS FINDINGS Hepatobiliary: Multiple hepatic cysts are noted. There is a 3.4 cm mass in the left hepatic lobe on axial image 52 which does not appear to be cystic. The gallbladder is unremarkable on limited images. Pancreas: Unremarkable. No pancreatic ductal dilatation or surrounding inflammatory changes. Spleen: Normal in size without focal abnormality. Adrenals/Urinary Tract: Adrenal glands are normal. Innumerable simple and hyperdense cysts throughout both kidneys. Probable parapelvic cysts on the right. A fluid-filled structure on the left as seen on coronal image 73 is likely a dilated left renal pelvis. I also suspect mild hydronephrosis. The ureters are normal in caliber. The bladder is normal. Stomach/Bowel: The stomach and small bowel are normal. The colon is unremarkable. The appendix is normal. Vascular/Lymphatic: No significant vascular findings are present. No enlarged abdominal  or pelvic lymph nodes. Reproductive: Prostate is unremarkable. Other: There is a small amount of free fluid in the pelvis. Musculoskeletal: No acute or significant osseous findings. IMPRESSION: 1. There is a left hepatic mass measuring 3.4 cm which is indeterminate. Recommend an MRI for further evaluation. 2. Apparent dilated left extrarenal pelvis without ureteral dilatation. Suspected mild hydronephrosis. The findings raise the  possibility of a UPJ obstruction. An ultrasound could better evaluate. No stones identified. 3. Subtle respiratory bronchiolitis interstitial lung disease not excluded with tiny subtle ground-glass nodules, particularly in the upper lobes. 4. Polycystic renal disease.  Liver cysts are noted as well. 5. Tiny pleural effusions of uncertain etiology. 6. There is a small amount of fluid in the pelvis which is of uncertain etiology as well. Electronically Signed   By: Dorise Bullion III M.D   On: 09/17/2018 16:14   Nm Pulmonary Perf And Vent  Result Date: 09/16/2018 CLINICAL DATA:  47 y/o  M; PE suspected, high pretest probability. EXAM: NUCLEAR MEDICINE VENTILATION - PERFUSION LUNG SCAN TECHNIQUE: Ventilation images were obtained in multiple projections using inhaled aerosol Tc-16m DTPA. Perfusion images were obtained in multiple projections after intravenous injection of Tc-69m MAA. RADIOPHARMACEUTICALS:  31.0 mCi of Tc-105m DTPA aerosol inhalation and 4.2 mCi Tc39m MAA IV COMPARISON:  09/16/2018 chest radiograph. FINDINGS: Ventilation: No focal ventilation defect. Perfusion: Large right upper lung perfusion defect. Moderate left and small right lower lobe perfusion defects. Small left upper lobe perfusion defect. Perfusion defects are mismatch. There is mild heterogeneity of ventilation. No radiographic correlate for perfusion defects. IMPRESSION: Multiple mismatched perfusion defects including a large defect in the right upper lung. High probability of pulmonary embolus. These results will be called to the ordering clinician or representative by the Radiologist Assistant, and communication documented in the PACS or zVision Dashboard. Electronically Signed   By: Kristine Garbe M.D.   On: 09/16/2018 22:18     I have independently reviewed the above radiologic studies and discussed with the patient   Recent Lab Findings: Lab Results  Component Value Date   WBC 4.3 09/18/2018   HGB 12.5 (L)  09/18/2018   HCT 37.9 (L) 09/18/2018   PLT 175 09/18/2018   GLUCOSE 101 (H) 09/18/2018   ALT 25 09/06/2017   AST 20 09/06/2017   NA 137 09/18/2018   K 3.6 09/18/2018   CL 110 09/18/2018   CREATININE 1.93 (H) 09/18/2018   BUN 19 09/18/2018   CO2 21 (L) 09/18/2018    Assessment / Plan:      1. Large Thrombus straddling PFO- patient is currently on Heparin, 2. CV- Atrial Fibrillation, S/P Cardioversion, currently in NSR on Cardizem drip 3. Hypertension 4. Tobacco Abuse 5. CKD- creatinine at 1.93, avoid nephrotoxic agents, continue hydration 6. Dispo- patient is stable, continue heparin, patient is currently asymptomatic, Dr. Prescott Gum is aware of the patient and will follow up with further recommendations, will likely not go to the OR today, Per Dr. Prescott Gum would need a left heart catheterization prior to surgery and cardiac gated CT  I  spent 60 minutes counseling the patient face to face.   Erin Barrett PA-C 09/18/2018 1:00 PM  I have examined the patient and reviewed the images of his coronary angiogram and both echocardiogram studies as well as the images of his chest and abdomen CT scan.  I have discussed his condition with the patient and family including the reasons for sternotomy with cardiopulmonary bypass and left and right atriotomy for removal of  thrombus.  I have discussed the patient's diagnosis and condition with Dr. Irish Lack for coordination of care.  The patient understands procedure will involve sternotomy, cardiopulmonary bypass, cardiac incisions, and postoperative ICU stay with multiple tubes and lines.  He understands the risks of bleeding, stroke, ventilator dependence, RV failure, organ failure, infection death. We will schedule surgery for tomorrow morning and continue heparin until then. Dahlia Byes MD

## 2018-09-18 NOTE — Progress Notes (Signed)
Patient agitated at bedside due to NPO status.  Nurse explained the patient's condition and the reason why he is unable to receive anything PO at this time.  Nurse explained to patient and patient's mother (at bedside) the importance of remaining NPO prior to surgery. Patient still adamant that he wants water. NPO status again reinforced.  Delorise Jackson, RN

## 2018-09-18 NOTE — Transfer of Care (Signed)
Immediate Anesthesia Transfer of Care Note  Patient: [name]  Procedure(s) Performed: TRANSESOPHAGEAL ECHOCARDIOGRAM (TEE) (N/A )  Patient Location: Endoscopy Unit  Anesthesia Type:MAC  Level of Consciousness: sedated and responds to stimulation  Airway & Oxygen Therapy: Patient Spontanous Breathing  Post-op Assessment: Report given to RN, Post -op Vital signs reviewed and stable and Patient moving all extremities  Post vital signs: Reviewed and stable  Last Vitals:  Vitals Value Taken Time  BP    Temp    Pulse    Resp    SpO2      Last Pain:  Vitals:   09/18/18 1103  TempSrc: Oral  PainSc: 0-No pain         Complications: No apparent anesthesia complications

## 2018-09-18 NOTE — Progress Notes (Signed)
[place] for heparin Indication: atrial fibrillation  Allergies  Allergen Reactions  . Lisinopril Swelling    Lip swelling    Patient Measurements: Height: 5\' 9"  (175.3 cm) Weight: 166 lb 10.7 oz (75.6 kg) IBW/kg (Calculated) : 70.7 Heparin Dosing Weight: 77kg  Vital Signs: Temp: 98.5 F (36.9 C) (11/29 1211) Temp Source: Oral (11/29 1211) BP: 146/130 (11/29 1700) Pulse Rate: 82 (11/29 1700)  Labs: Recent Labs    09/16/18 1047  09/17/18 0419 09/17/18 1340 09/17/18 2044 09/18/18 0434  HGB 14.6  --  12.5*  --   --  12.5*  HCT 45.9  --  36.9*  --   --  37.9*  PLT 179  --  158  --   --  175  HEPARINUNFRC  --    < > 0.17* 0.92* 0.48 0.44  CREATININE 2.10*  --   --   --   --  1.93*   < > = values in this interval not displayed.    Estimated Creatinine Clearance: 48.3 mL/min (A) (by C-G formula based on SCr of 1.93 mg/dL (H)).  Assessment: 47 yo m presenting with acute PE diagnosed by VQ, large mobile mass in RA. Not on anticoagulation PTA.  Heparin level therapeutic at 0.44 on 1200 units/hr prior to [facility] this afternoon  Plan is for OR in am to remove clot  Goal of Therapy:  Heparin level 0.3-0.7 units/ml Monitor platelets by anticoagulation protocol: Yes   Plan:  Resume heparin at 1300 units/hr @ 2030 after cath; will empirically increase dose since no bolus and hopefully get therapeutic quicker Monitor daily heparin level and CBC, s/sx bleeding Likely to OR in am with CVTS F/U plans for Oral [facility]  Levester Fresh, PharmD, BCPS, BCCCP Clinical Pharmacist [phone]  Please check AMION for all [place] numbers  09/18/2018 5:44 PM

## 2018-09-19 ENCOUNTER — Inpatient Hospital Stay (HOSPITAL_COMMUNITY)
Admission: EM | Disposition: A | Discharge status: Home / Self Care | Payer: Self-pay | Source: Home / Self Care | Attending: Cardiothoracic Surgery

## 2018-09-19 ENCOUNTER — Inpatient Hospital Stay (HOSPITAL_COMMUNITY): Payer: Self-pay

## 2018-09-19 ENCOUNTER — Encounter (HOSPITAL_COMMUNITY): Payer: Self-pay | Admitting: Certified Registered Nurse Anesthetist

## 2018-09-19 ENCOUNTER — Inpatient Hospital Stay (HOSPITAL_COMMUNITY): Payer: Self-pay | Admitting: Certified Registered"

## 2018-09-19 DIAGNOSIS — Q2112 Patent foramen ovale: Secondary | ICD-10-CM

## 2018-09-19 DIAGNOSIS — Q211 Atrial septal defect: Secondary | ICD-10-CM

## 2018-09-19 HISTORY — PX: CLIPPING OF ATRIAL APPENDAGE: SHX5773

## 2018-09-19 HISTORY — PX: INTRAOPERATIVE TRANSESOPHAGEAL ECHOCARDIOGRAM: SHX5062

## 2018-09-19 HISTORY — PX: REPAIR OF PATENT FORAMEN OVALE: SHX6064

## 2018-09-19 LAB — CBC
HCT: 35.7 % — ABNORMAL LOW (ref 39.0–52.0)
HCT: 35.7 % — ABNORMAL LOW (ref 39.0–52.0)
HCT: 33 % — ABNORMAL LOW (ref 39.0–52.0)
HEMOGLOBIN: 12.2 g/dL — AB (ref 13.0–17.0)
Hemoglobin: 12.1 g/dL — ABNORMAL LOW (ref 13.0–17.0)
Hemoglobin: 11.3 g/dL — ABNORMAL LOW (ref 13.0–17.0)
MCH: 34.4 pg — ABNORMAL HIGH (ref 26.0–34.0)
MCH: 34.6 pg — ABNORMAL HIGH (ref 26.0–34.0)
MCH: 34.9 pg — ABNORMAL HIGH (ref 26.0–34.0)
MCHC: 33.9 g/dL (ref 30.0–36.0)
MCHC: 34.2 g/dL (ref 30.0–36.0)
MCHC: 34.2 g/dL (ref 30.0–36.0)
MCV: 101.4 fL — ABNORMAL HIGH (ref 80.0–100.0)
MCV: 101.1 fL — ABNORMAL HIGH (ref 80.0–100.0)
MCV: 101.9 fL — ABNORMAL HIGH (ref 80.0–100.0)
Platelets: 168 10*3/uL (ref 150–400)
Platelets: 139 10*3/uL — ABNORMAL LOW (ref 150–400)
Platelets: 133 10*3/uL — ABNORMAL LOW (ref 150–400)
RBC: 3.52 MIL/uL — ABNORMAL LOW (ref 4.22–5.81)
RBC: 3.53 MIL/uL — ABNORMAL LOW (ref 4.22–5.81)
RBC: 3.24 MIL/uL — ABNORMAL LOW (ref 4.22–5.81)
RDW: 12.4 % (ref 11.5–15.5)
RDW: 12.4 % (ref 11.5–15.5)
RDW: 12.2 % (ref 11.5–15.5)
WBC: 4.9 10*3/uL (ref 4.0–10.5)
WBC: 7.9 10*3/uL (ref 4.0–10.5)
WBC: 5.1 10*3/uL (ref 4.0–10.5)
nRBC: 0 % (ref 0.0–0.2)
nRBC: 0 % (ref 0.0–0.2)
nRBC: 0 % (ref 0.0–0.2)

## 2018-09-19 LAB — BLOOD GAS, ARTERIAL
Acid-base deficit: 3.8 mmol/L — ABNORMAL HIGH (ref 0.0–2.0)
Bicarbonate: 21.7 mmol/L (ref 20.0–28.0)
O2 Saturation: 97.6 %
Patient temperature: 98.6
pCO2 arterial: 46.5 mmHg (ref 32.0–48.0)
pH, Arterial: 7.291 — ABNORMAL LOW (ref 7.350–7.450)
pO2, Arterial: 121 mmHg — ABNORMAL HIGH (ref 83.0–108.0)

## 2018-09-19 LAB — ABO/RH: ABO/RH(D): AB POS

## 2018-09-19 LAB — POCT I-STAT, CHEM 8
BUN: 14 mg/dL (ref 6–20)
BUN: 13 mg/dL (ref 6–20)
BUN: 15 mg/dL (ref 6–20)
BUN: 11 mg/dL (ref 6–20)
BUN: 15 mg/dL (ref 6–20)
Calcium, Ion: 1.2 mmol/L (ref 1.15–1.40)
Calcium, Ion: 1.04 mmol/L — ABNORMAL LOW (ref 1.15–1.40)
Calcium, Ion: 1.21 mmol/L (ref 1.15–1.40)
Calcium, Ion: 1.21 mmol/L (ref 1.15–1.40)
Calcium, Ion: 1.18 mmol/L (ref 1.15–1.40)
Chloride: 109 mmol/L (ref 98–111)
Chloride: 104 mmol/L (ref 98–111)
Chloride: 108 mmol/L (ref 98–111)
Chloride: 105 mmol/L (ref 98–111)
Chloride: 109 mmol/L (ref 98–111)
Creatinine, Ser: 1.6 mg/dL — ABNORMAL HIGH (ref 0.61–1.24)
Creatinine, Ser: 1.5 mg/dL — ABNORMAL HIGH (ref 0.61–1.24)
Creatinine, Ser: 1.6 mg/dL — ABNORMAL HIGH (ref 0.61–1.24)
Creatinine, Ser: 1.5 mg/dL — ABNORMAL HIGH (ref 0.61–1.24)
Creatinine, Ser: 1.7 mg/dL — ABNORMAL HIGH (ref 0.61–1.24)
Glucose, Bld: 109 mg/dL — ABNORMAL HIGH (ref 70–99)
Glucose, Bld: 105 mg/dL — ABNORMAL HIGH (ref 70–99)
Glucose, Bld: 116 mg/dL — ABNORMAL HIGH (ref 70–99)
Glucose, Bld: 135 mg/dL — ABNORMAL HIGH (ref 70–99)
Glucose, Bld: 121 mg/dL — ABNORMAL HIGH (ref 70–99)
HCT: 33 % — ABNORMAL LOW (ref 39.0–52.0)
HCT: 28 % — ABNORMAL LOW (ref 39.0–52.0)
HCT: 33 % — ABNORMAL LOW (ref 39.0–52.0)
HCT: 28 % — ABNORMAL LOW (ref 39.0–52.0)
HCT: 32 % — ABNORMAL LOW (ref 39.0–52.0)
Hemoglobin: 11.2 g/dL — ABNORMAL LOW (ref 13.0–17.0)
Hemoglobin: 11.2 g/dL — ABNORMAL LOW (ref 13.0–17.0)
Hemoglobin: 9.5 g/dL — ABNORMAL LOW (ref 13.0–17.0)
Hemoglobin: 9.5 g/dL — ABNORMAL LOW (ref 13.0–17.0)
Hemoglobin: 10.9 g/dL — ABNORMAL LOW (ref 13.0–17.0)
POTASSIUM: 3.7 mmol/L (ref 3.5–5.1)
Potassium: 4 mmol/L (ref 3.5–5.1)
Potassium: 4.2 mmol/L (ref 3.5–5.1)
Potassium: 4.7 mmol/L (ref 3.5–5.1)
Potassium: 4.9 mmol/L (ref 3.5–5.1)
Sodium: 141 mmol/L (ref 135–145)
Sodium: 138 mmol/L (ref 135–145)
Sodium: 140 mmol/L (ref 135–145)
Sodium: 138 mmol/L (ref 135–145)
Sodium: 138 mmol/L (ref 135–145)
TCO2: 26 mmol/L (ref 22–32)
TCO2: 25 mmol/L (ref 22–32)
TCO2: 25 mmol/L (ref 22–32)
TCO2: 26 mmol/L (ref 22–32)
TCO2: 24 mmol/L (ref 22–32)

## 2018-09-19 LAB — GLUCOSE, CAPILLARY
Glucose-Capillary: 106 mg/dL — ABNORMAL HIGH (ref 70–99)
Glucose-Capillary: 107 mg/dL — ABNORMAL HIGH (ref 70–99)
Glucose-Capillary: 76 mg/dL (ref 70–99)
Glucose-Capillary: 93 mg/dL (ref 70–99)
Glucose-Capillary: 130 mg/dL — ABNORMAL HIGH (ref 70–99)

## 2018-09-19 LAB — POCT I-STAT 3, ART BLOOD GAS (G3+)
ACID-BASE DEFICIT: 1 mmol/L (ref 0.0–2.0)
Acid-base deficit: 4 mmol/L — ABNORMAL HIGH (ref 0.0–2.0)
Acid-base deficit: 3 mmol/L — ABNORMAL HIGH (ref 0.0–2.0)
Acid-base deficit: 1 mmol/L (ref 0.0–2.0)
Acid-base deficit: 3 mmol/L — ABNORMAL HIGH (ref 0.0–2.0)
Bicarbonate: 22.5 mmol/L (ref 20.0–28.0)
Bicarbonate: 24.4 mmol/L (ref 20.0–28.0)
Bicarbonate: 26.1 mmol/L (ref 20.0–28.0)
Bicarbonate: 26.5 mmol/L (ref 20.0–28.0)
Bicarbonate: 23.1 mmol/L (ref 20.0–28.0)
O2 SAT: 100 %
O2 SAT: 100 %
O2 SAT: 100 %
O2 Saturation: 100 %
O2 Saturation: 97 %
TCO2: 24 mmol/L (ref 22–32)
TCO2: 26 mmol/L (ref 22–32)
TCO2: 28 mmol/L (ref 22–32)
TCO2: 28 mmol/L (ref 22–32)
TCO2: 24 mmol/L (ref 22–32)
pCO2 arterial: 44.2 mmHg (ref 32.0–48.0)
pCO2 arterial: 51.9 mmHg — ABNORMAL HIGH (ref 32.0–48.0)
pCO2 arterial: 56.5 mmHg — ABNORMAL HIGH (ref 32.0–48.0)
pCO2 arterial: 54.5 mmHg — ABNORMAL HIGH (ref 32.0–48.0)
pCO2 arterial: 43.4 mmHg (ref 32.0–48.0)
pH, Arterial: 7.314 — ABNORMAL LOW (ref 7.350–7.450)
pH, Arterial: 7.272 — ABNORMAL LOW (ref 7.350–7.450)
pH, Arterial: 7.28 — ABNORMAL LOW (ref 7.350–7.450)
pH, Arterial: 7.296 — ABNORMAL LOW (ref 7.350–7.450)
pH, Arterial: 7.334 — ABNORMAL LOW (ref 7.350–7.450)
pO2, Arterial: 281 mmHg — ABNORMAL HIGH (ref 83.0–108.0)
pO2, Arterial: 389 mmHg — ABNORMAL HIGH (ref 83.0–108.0)
pO2, Arterial: 411 mmHg — ABNORMAL HIGH (ref 83.0–108.0)
pO2, Arterial: 476 mmHg — ABNORMAL HIGH (ref 83.0–108.0)
pO2, Arterial: 103 mmHg (ref 83.0–108.0)

## 2018-09-19 LAB — BASIC METABOLIC PANEL
Anion gap: 12 (ref 5–15)
BUN: 17 mg/dL (ref 6–20)
CO2: 19 mmol/L — ABNORMAL LOW (ref 22–32)
Calcium: 8.3 mg/dL — ABNORMAL LOW (ref 8.9–10.3)
Chloride: 106 mmol/L (ref 98–111)
Creatinine, Ser: 1.93 mg/dL — ABNORMAL HIGH (ref 0.61–1.24)
GFR calc Af Amer: 47 mL/min — ABNORMAL LOW (ref >60)
GFR calc non Af Amer: 41 mL/min — ABNORMAL LOW (ref >60)
Glucose, Bld: 97 mg/dL (ref 70–99)
POTASSIUM: 3.8 mmol/L (ref 3.5–5.1)
Sodium: 137 mmol/L (ref 135–145)

## 2018-09-19 LAB — POCT I-STAT 4, (NA,K, GLUC, HGB,HCT)
Glucose, Bld: 123 mg/dL — ABNORMAL HIGH (ref 70–99)
HCT: 34 % — ABNORMAL LOW (ref 39.0–52.0)
Hemoglobin: 11.6 g/dL — ABNORMAL LOW (ref 13.0–17.0)
Potassium: 4.7 mmol/L (ref 3.5–5.1)
Sodium: 140 mmol/L (ref 135–145)

## 2018-09-19 LAB — CREATININE, SERUM
Creatinine, Ser: 1.66 mg/dL — ABNORMAL HIGH (ref 0.61–1.24)
GFR calc Af Amer: 57 mL/min — ABNORMAL LOW (ref >60)
GFR calc non Af Amer: 49 mL/min — ABNORMAL LOW (ref >60)

## 2018-09-19 LAB — PROTIME-INR
INR: 1.22
Prothrombin Time: 15.2 seconds (ref 11.4–15.2)

## 2018-09-19 LAB — COOXEMETRY PANEL
Carboxyhemoglobin: 1 % (ref 0.5–1.5)
Methemoglobin: 1 % (ref 0.0–1.5)
O2 Saturation: 56.2 %
Total hemoglobin: 12.6 g/dL (ref 12.0–16.0)

## 2018-09-19 LAB — MAGNESIUM: Magnesium: 2.9 mg/dL — ABNORMAL HIGH (ref 1.7–2.4)

## 2018-09-19 LAB — APTT: aPTT: 26 seconds (ref 24–36)

## 2018-09-19 LAB — HEPARIN LEVEL (UNFRACTIONATED): Heparin Unfractionated: 0.25 IU/mL — ABNORMAL LOW (ref 0.30–0.70)

## 2018-09-19 LAB — HEMOGLOBIN AND HEMATOCRIT, BLOOD
HCT: 27 % — ABNORMAL LOW (ref 39.0–52.0)
Hemoglobin: 9.1 g/dL — ABNORMAL LOW (ref 13.0–17.0)

## 2018-09-19 LAB — HEPARIN INDUCED PLATELET AB (HIT ANTIBODY): Heparin Induced Plt Ab: 0.125 OD (ref 0.000–0.400)

## 2018-09-19 LAB — PLATELET COUNT: Platelets: 143 10*3/uL — ABNORMAL LOW (ref 150–400)

## 2018-09-19 SURGERY — REPAIR OF PATENT FORAMEN OVALE
Anesthesia: General | Site: Chest

## 2018-09-19 MED ORDER — LACTATED RINGERS IV SOLN: 500.0000 mL | Freq: Once | INTRAVENOUS | Status: DC | PRN

## 2018-09-19 MED ORDER — NITROGLYCERIN IN D5W 200-5 MCG/ML-% IV SOLN
0.0000 ug/min | INTRAVENOUS | Status: DC
  Administered 2018-09-19: 0 ug/min via INTRAVENOUS

## 2018-09-19 MED ORDER — METOPROLOL TARTRATE 5 MG/5ML IV SOLN
2.5000 mg | INTRAVENOUS | Status: DC | PRN
  Filled 2018-09-19: qty 5

## 2018-09-19 MED ORDER — POTASSIUM CHLORIDE 10 MEQ/50ML IV SOLN: 10.0000 meq | INTRAVENOUS | Status: AC

## 2018-09-19 MED ORDER — HEPARIN SODIUM (PORCINE) 1000 UNIT/ML IJ SOLN
INTRAMUSCULAR | Status: AC
  Filled 2018-09-19: qty 1

## 2018-09-19 MED ORDER — ORAL CARE MOUTH RINSE
15.0000 mL | OROMUCOSAL | Status: DC
  Administered 2018-09-19 – 2018-09-20 (×6): 15 mL via OROMUCOSAL

## 2018-09-19 MED ORDER — SODIUM CHLORIDE 0.9 % IV SOLN: 250.0000 mL | INTRAVENOUS | Status: DC

## 2018-09-19 MED ORDER — PROPOFOL 10 MG/ML IV BOLUS
INTRAVENOUS | Status: AC
  Filled 2018-09-19: qty 20

## 2018-09-19 MED ORDER — FENTANYL CITRATE (PF) 250 MCG/5ML IJ SOLN
INTRAMUSCULAR | Status: AC
  Filled 2018-09-19: qty 20

## 2018-09-19 MED ORDER — ROCURONIUM BROMIDE 10 MG/ML (PF) SYRINGE
PREFILLED_SYRINGE | INTRAVENOUS | Status: DC | PRN
  Administered 2018-09-19 (×3): 50 mg via INTRAVENOUS
  Administered 2018-09-19: 30 mg via INTRAVENOUS

## 2018-09-19 MED ORDER — SODIUM CHLORIDE 0.9 % IV SOLN
1.5000 g | Freq: Two times a day (BID) | INTRAVENOUS | Status: DC
  Administered 2018-09-19 – 2018-09-20 (×3): 1.5 g via INTRAVENOUS
  Filled 2018-09-19 (×5): qty 1.5

## 2018-09-19 MED ORDER — FAMOTIDINE IN NACL 20-0.9 MG/50ML-% IV SOLN
20.0000 mg | Freq: Two times a day (BID) | INTRAVENOUS | Status: AC
  Administered 2018-09-19 (×2): 20 mg via INTRAVENOUS
  Filled 2018-09-19: qty 50

## 2018-09-19 MED ORDER — MAGNESIUM SULFATE 4 GM/100ML IV SOLN
4.0000 g | Freq: Once | INTRAVENOUS | Status: AC
  Administered 2018-09-19: 4 g via INTRAVENOUS
  Filled 2018-09-19: qty 100

## 2018-09-19 MED ORDER — DOCUSATE SODIUM 100 MG PO CAPS
200.0000 mg | ORAL_CAPSULE | Freq: Every day | ORAL | Status: DC
  Administered 2018-09-21 – 2018-09-25 (×3): 200 mg via ORAL
  Filled 2018-09-19 (×4): qty 2

## 2018-09-19 MED ORDER — ARTIFICIAL TEARS OPHTHALMIC OINT
TOPICAL_OINTMENT | OPHTHALMIC | Status: DC | PRN
  Administered 2018-09-19: 1 via OPHTHALMIC

## 2018-09-19 MED ORDER — PROPOFOL 10 MG/ML IV BOLUS
INTRAVENOUS | Status: DC | PRN
  Administered 2018-09-19: 100 mg via INTRAVENOUS

## 2018-09-19 MED ORDER — ACETAMINOPHEN 500 MG PO TABS
1000.0000 mg | ORAL_TABLET | Freq: Four times a day (QID) | ORAL | Status: AC
  Administered 2018-09-20 – 2018-09-24 (×16): 1000 mg via ORAL
  Filled 2018-09-19 (×16): qty 2

## 2018-09-19 MED ORDER — PANTOPRAZOLE SODIUM 40 MG PO TBEC
40.0000 mg | DELAYED_RELEASE_TABLET | Freq: Every day | ORAL | Status: DC
  Administered 2018-09-21 – 2018-09-26 (×6): 40 mg via ORAL
  Filled 2018-09-19 (×6): qty 1

## 2018-09-19 MED ORDER — LACTATED RINGERS IV SOLN
INTRAVENOUS | Status: DC | PRN
  Administered 2018-09-19: 08:00:00 via INTRAVENOUS

## 2018-09-19 MED ORDER — ARTIFICIAL TEARS OPHTHALMIC OINT
TOPICAL_OINTMENT | OPHTHALMIC | Status: AC
  Filled 2018-09-19: qty 3.5

## 2018-09-19 MED ORDER — THIAMINE HCL 100 MG/ML IJ SOLN: 100.0000 mg | Freq: Every day | INTRAMUSCULAR | Status: DC

## 2018-09-19 MED ORDER — MIDAZOLAM HCL 2 MG/2ML IJ SOLN: 2.0000 mg | INTRAMUSCULAR | Status: DC | PRN

## 2018-09-19 MED ORDER — CHLORHEXIDINE GLUCONATE 0.12% ORAL RINSE (MEDLINE KIT)
15.0000 mL | Freq: Two times a day (BID) | OROMUCOSAL | Status: DC
  Administered 2018-09-19 – 2018-09-20 (×2): 15 mL via OROMUCOSAL

## 2018-09-19 MED ORDER — SODIUM CHLORIDE (PF) 0.9 % IJ SOLN
OROMUCOSAL | Status: DC | PRN
  Administered 2018-09-19 (×3): 4 mL via TOPICAL

## 2018-09-19 MED ORDER — SODIUM BICARBONATE 8.4 % IV SOLN
INTRAVENOUS | Status: AC
  Filled 2018-09-19: qty 50

## 2018-09-19 MED ORDER — LORAZEPAM 2 MG/ML IJ SOLN
2.0000 mg | INTRAMUSCULAR | Status: DC | PRN
  Administered 2018-09-19 – 2018-09-20 (×3): 2 mg via INTRAVENOUS
  Filled 2018-09-19 (×3): qty 1

## 2018-09-19 MED ORDER — ROCURONIUM BROMIDE 50 MG/5ML IV SOSY
PREFILLED_SYRINGE | INTRAVENOUS | Status: AC
  Filled 2018-09-19: qty 5

## 2018-09-19 MED ORDER — ONDANSETRON HCL 4 MG/2ML IJ SOLN
INTRAMUSCULAR | Status: AC
  Filled 2018-09-19: qty 2

## 2018-09-19 MED ORDER — MORPHINE SULFATE (PF) 2 MG/ML IV SOLN: 1.0000 mg | INTRAVENOUS | Status: DC | PRN

## 2018-09-19 MED ORDER — ALBUMIN HUMAN 5 % IV SOLN
250.0000 mL | INTRAVENOUS | Status: AC | PRN
  Administered 2018-09-19 – 2018-09-20 (×3): 12.5 g via INTRAVENOUS
  Filled 2018-09-19: qty 250

## 2018-09-19 MED ORDER — BISACODYL 5 MG PO TBEC
10.0000 mg | DELAYED_RELEASE_TABLET | Freq: Every day | ORAL | Status: DC
  Administered 2018-09-21 – 2018-09-25 (×3): 10 mg via ORAL
  Filled 2018-09-19 (×3): qty 2

## 2018-09-19 MED ORDER — INSULIN REGULAR(HUMAN) IN NACL 100-0.9 UT/100ML-% IV SOLN: INTRAVENOUS | Status: DC

## 2018-09-19 MED ORDER — FENTANYL CITRATE (PF) 250 MCG/5ML IJ SOLN
INTRAMUSCULAR | Status: AC
  Filled 2018-09-19: qty 5

## 2018-09-19 MED ORDER — LACTATED RINGERS IV SOLN
INTRAVENOUS | Status: DC
  Administered 2018-09-19: 17:00:00 via INTRAVENOUS

## 2018-09-19 MED ORDER — LACTATED RINGERS IV SOLN
INTRAVENOUS | Status: DC | PRN
  Administered 2018-09-19: 10:00:00 via INTRAVENOUS

## 2018-09-19 MED ORDER — OXYCODONE HCL 5 MG PO TABS
5.0000 mg | ORAL_TABLET | ORAL | Status: DC | PRN
  Administered 2018-09-20 – 2018-09-26 (×24): 10 mg via ORAL
  Filled 2018-09-19 (×24): qty 2

## 2018-09-19 MED ORDER — SODIUM CHLORIDE 0.9% FLUSH
3.0000 mL | Freq: Two times a day (BID) | INTRAVENOUS | Status: DC
  Administered 2018-09-20 (×2): 3 mL via INTRAVENOUS
  Administered 2018-09-21: 10 mL via INTRAVENOUS
  Administered 2018-09-22 – 2018-09-26 (×6): 3 mL via INTRAVENOUS

## 2018-09-19 MED ORDER — SODIUM BICARBONATE 8.4 % IV SOLN
INTRAVENOUS | Status: DC | PRN
  Administered 2018-09-19: 25 meq via INTRAVENOUS

## 2018-09-19 MED ORDER — ACETAMINOPHEN 650 MG RE SUPP
650.0000 mg | Freq: Once | RECTAL | Status: AC
  Administered 2018-09-19: 650 mg via RECTAL

## 2018-09-19 MED ORDER — INSULIN REGULAR BOLUS VIA INFUSION
0.0000 [IU] | Freq: Three times a day (TID) | INTRAVENOUS | Status: DC
  Filled 2018-09-19: qty 10

## 2018-09-19 MED ORDER — PROTAMINE SULFATE 10 MG/ML IV SOLN
INTRAVENOUS | Status: AC
  Filled 2018-09-19: qty 25

## 2018-09-19 MED ORDER — SODIUM CHLORIDE 0.45 % IV SOLN
INTRAVENOUS | Status: DC | PRN
  Administered 2018-09-19: 14:00:00 via INTRAVENOUS

## 2018-09-19 MED ORDER — FENTANYL CITRATE (PF) 250 MCG/5ML IJ SOLN
INTRAMUSCULAR | Status: DC | PRN
  Administered 2018-09-19: 250 ug via INTRAVENOUS
  Administered 2018-09-19: 150 ug via INTRAVENOUS
  Administered 2018-09-19: 100 ug via INTRAVENOUS
  Administered 2018-09-19: 150 ug via INTRAVENOUS

## 2018-09-19 MED ORDER — ASPIRIN EC 325 MG PO TBEC
325.0000 mg | DELAYED_RELEASE_TABLET | Freq: Every day | ORAL | Status: DC
  Administered 2018-09-21 – 2018-09-24 (×4): 325 mg via ORAL
  Filled 2018-09-19 (×5): qty 1

## 2018-09-19 MED ORDER — ONDANSETRON HCL 4 MG/2ML IJ SOLN: 4.0000 mg | Freq: Four times a day (QID) | INTRAMUSCULAR | Status: DC | PRN

## 2018-09-19 MED ORDER — SODIUM CHLORIDE 0.9% IV SOLUTION: Freq: Once | INTRAVENOUS | Status: DC

## 2018-09-19 MED ORDER — NOREPINEPHRINE 4 MG/250ML-% IV SOLN: 0.0000 ug/min | INTRAVENOUS | Status: DC

## 2018-09-19 MED ORDER — ASPIRIN 81 MG PO CHEW: 324.0000 mg | CHEWABLE_TABLET | Freq: Every day | ORAL | Status: DC

## 2018-09-19 MED ORDER — CHLORHEXIDINE GLUCONATE CLOTH 2 % EX PADS
6.0000 | MEDICATED_PAD | Freq: Every day | CUTANEOUS | Status: DC
  Administered 2018-09-20 – 2018-09-25 (×6): 6 via TOPICAL

## 2018-09-19 MED ORDER — LACTATED RINGERS IV SOLN: INTRAVENOUS | Status: DC

## 2018-09-19 MED ORDER — INSULIN ASPART 100 UNIT/ML ~~LOC~~ SOLN
0.0000 [IU] | SUBCUTANEOUS | Status: DC
  Administered 2018-09-19 – 2018-09-21 (×4): 2 [IU] via SUBCUTANEOUS

## 2018-09-19 MED ORDER — DIPHENHYDRAMINE HCL 50 MG/ML IJ SOLN
INTRAMUSCULAR | Status: AC
  Filled 2018-09-19: qty 1

## 2018-09-19 MED ORDER — MIDAZOLAM HCL (PF) 10 MG/2ML IJ SOLN
INTRAMUSCULAR | Status: AC
  Filled 2018-09-19: qty 2

## 2018-09-19 MED ORDER — LEVALBUTEROL HCL 1.25 MG/0.5ML IN NEBU
1.2500 mg | INHALATION_SOLUTION | Freq: Four times a day (QID) | RESPIRATORY_TRACT | Status: DC
  Administered 2018-09-19 – 2018-09-20 (×5): 1.25 mg via RESPIRATORY_TRACT
  Filled 2018-09-19 (×5): qty 0.5

## 2018-09-19 MED ORDER — 0.9 % SODIUM CHLORIDE (POUR BTL) OPTIME
TOPICAL | Status: DC | PRN
  Administered 2018-09-19: 5000 mL

## 2018-09-19 MED ORDER — HEPARIN (PORCINE) 25000 UT/250ML-% IV SOLN
1500.0000 [IU]/h | INTRAVENOUS | Status: DC
  Administered 2018-09-19: 800 [IU]/h via INTRAVENOUS
  Administered 2018-09-20: 1100 [IU]/h via INTRAVENOUS
  Administered 2018-09-21: 1300 [IU]/h via INTRAVENOUS
  Administered 2018-09-22: 1400 [IU]/h via INTRAVENOUS
  Administered 2018-09-22 – 2018-09-24 (×3): 1600 [IU]/h via INTRAVENOUS
  Administered 2018-09-25: 1500 [IU]/h via INTRAVENOUS
  Filled 2018-09-19 (×8): qty 250

## 2018-09-19 MED ORDER — CHLORHEXIDINE GLUCONATE 0.12 % MT SOLN
15.0000 mL | OROMUCOSAL | Status: AC
  Administered 2018-09-19: 15 mL via OROMUCOSAL

## 2018-09-19 MED ORDER — HEMOSTATIC AGENTS (NO CHARGE) OPTIME
TOPICAL | Status: DC | PRN
  Administered 2018-09-19 (×4): 1 via TOPICAL

## 2018-09-19 MED ORDER — PROTAMINE SULFATE 10 MG/ML IV SOLN
INTRAVENOUS | Status: DC | PRN
  Administered 2018-09-19: 210 mg via INTRAVENOUS

## 2018-09-19 MED ORDER — ACETAMINOPHEN 160 MG/5ML PO SOLN
1000.0000 mg | Freq: Four times a day (QID) | ORAL | Status: AC
  Administered 2018-09-20 (×2): 1000 mg
  Filled 2018-09-19 (×2): qty 40.6

## 2018-09-19 MED ORDER — SODIUM CHLORIDE 0.9% FLUSH
10.0000 mL | Freq: Two times a day (BID) | INTRAVENOUS | Status: DC
  Administered 2018-09-19 – 2018-09-23 (×7): 10 mL
  Administered 2018-09-23: 20 mL
  Administered 2018-09-24 – 2018-09-26 (×5): 10 mL

## 2018-09-19 MED ORDER — SODIUM CHLORIDE 0.9% FLUSH: 10.0000 mL | INTRAVENOUS | Status: DC | PRN

## 2018-09-19 MED ORDER — BISACODYL 10 MG RE SUPP: 10.0000 mg | Freq: Every day | RECTAL | Status: DC

## 2018-09-19 MED ORDER — GLUTARALDEHYDE 0.625% SOAKING SOLUTION
TOPICAL | Status: DC
  Filled 2018-09-19: qty 50

## 2018-09-19 MED ORDER — MILRINONE LACTATE IN DEXTROSE 20-5 MG/100ML-% IV SOLN
0.1250 ug/kg/min | INTRAVENOUS | Status: DC
  Administered 2018-09-20 – 2018-09-22 (×4): 0.25 ug/kg/min via INTRAVENOUS
  Administered 2018-09-22: 0.125 ug/kg/min via INTRAVENOUS
  Filled 2018-09-19 (×4): qty 100

## 2018-09-19 MED ORDER — VANCOMYCIN HCL IN DEXTROSE 1-5 GM/200ML-% IV SOLN
1000.0000 mg | Freq: Once | INTRAVENOUS | Status: AC
  Administered 2018-09-19: 1000 mg via INTRAVENOUS
  Filled 2018-09-19: qty 200

## 2018-09-19 MED ORDER — METOPROLOL TARTRATE 12.5 MG HALF TABLET: 12.5000 mg | ORAL_TABLET | Freq: Two times a day (BID) | ORAL | Status: DC

## 2018-09-19 MED ORDER — LORAZEPAM BOLUS VIA INFUSION: 2.0000 mg | INTRAVENOUS | Status: DC | PRN

## 2018-09-19 MED ORDER — EPINEPHRINE PF 1 MG/ML IJ SOLN
0.0000 ug/min | INTRAVENOUS | Status: DC
  Administered 2018-09-19: 1 ug/min via INTRAVENOUS
  Filled 2018-09-19: qty 4

## 2018-09-19 MED ORDER — DIPHENHYDRAMINE HCL 50 MG/ML IJ SOLN
INTRAMUSCULAR | Status: DC | PRN
  Administered 2018-09-19 (×2): 25 mg via INTRAVENOUS

## 2018-09-19 MED ORDER — PHENYLEPHRINE HCL-NACL 20-0.9 MG/250ML-% IV SOLN: 30.0000 ug/min | INTRAVENOUS | Status: DC

## 2018-09-19 MED ORDER — PHENYLEPHRINE 40 MCG/ML (10ML) SYRINGE FOR IV PUSH (FOR BLOOD PRESSURE SUPPORT)
PREFILLED_SYRINGE | INTRAVENOUS | Status: AC
  Filled 2018-09-19: qty 10

## 2018-09-19 MED ORDER — PHENYLEPHRINE HCL-NACL 20-0.9 MG/250ML-% IV SOLN
30.0000 ug/min | INTRAVENOUS | Status: DC
  Administered 2018-09-19: 40 ug/min via INTRAVENOUS
  Administered 2018-09-20 (×2): 30 ug/min via INTRAVENOUS
  Filled 2018-09-19 (×2): qty 250

## 2018-09-19 MED ORDER — SODIUM CHLORIDE 0.9% FLUSH: 3.0000 mL | INTRAVENOUS | Status: DC | PRN

## 2018-09-19 MED ORDER — ROCURONIUM BROMIDE 50 MG/5ML IV SOSY
PREFILLED_SYRINGE | INTRAVENOUS | Status: AC
  Filled 2018-09-19: qty 10

## 2018-09-19 MED ORDER — METOPROLOL TARTRATE 25 MG/10 ML ORAL SUSPENSION
12.5000 mg | Freq: Two times a day (BID) | ORAL | Status: DC
  Filled 2018-09-19: qty 5

## 2018-09-19 MED ORDER — ACETAMINOPHEN 160 MG/5ML PO SOLN: 650.0000 mg | Freq: Once | ORAL | Status: AC

## 2018-09-19 MED ORDER — DEXMEDETOMIDINE HCL IN NACL 200 MCG/50ML IV SOLN
0.1000 ug/kg/h | INTRAVENOUS | Status: DC
  Administered 2018-09-19 (×2): 0.8 ug/kg/h via INTRAVENOUS
  Administered 2018-09-19: 1 ug/kg/h via INTRAVENOUS
  Administered 2018-09-20: 0.3 ug/kg/h via INTRAVENOUS
  Administered 2018-09-20 (×2): 0.8 ug/kg/h via INTRAVENOUS
  Filled 2018-09-19 (×6): qty 50

## 2018-09-19 MED ORDER — SODIUM CHLORIDE 0.9 % IV SOLN
INTRAVENOUS | Status: DC
  Administered 2018-09-21: 21:00:00 via INTRAVENOUS

## 2018-09-19 MED ORDER — MIDAZOLAM HCL 2 MG/2ML IJ SOLN
INTRAMUSCULAR | Status: AC
  Administered 2018-09-19: 2 mg
  Filled 2018-09-19: qty 2

## 2018-09-19 MED ORDER — LEVALBUTEROL HCL 1.25 MG/0.5ML IN NEBU: 1.2500 mg | INHALATION_SOLUTION | Freq: Four times a day (QID) | RESPIRATORY_TRACT | Status: DC

## 2018-09-19 MED ORDER — HEPARIN SODIUM (PORCINE) 1000 UNIT/ML IJ SOLN
INTRAMUSCULAR | Status: DC | PRN
  Administered 2018-09-19: 5000 [IU] via INTRAVENOUS
  Administered 2018-09-19: 23000 [IU] via INTRAVENOUS

## 2018-09-19 MED ORDER — TRAMADOL HCL 50 MG PO TABS: 50.0000 mg | ORAL_TABLET | ORAL | Status: DC | PRN

## 2018-09-19 MED ORDER — MIDAZOLAM HCL 5 MG/5ML IJ SOLN
INTRAMUSCULAR | Status: DC | PRN
  Administered 2018-09-19: 1 mg via INTRAVENOUS
  Administered 2018-09-19: 5 mg via INTRAVENOUS
  Administered 2018-09-19 (×2): 2 mg via INTRAVENOUS

## 2018-09-19 SURGICAL SUPPLY — 112 items
ADAPTER CARDIO PERF ANTE/RETRO (ADAPTER) IMPLANT
ATRICLIP EXCLUSION 40 STD HAND (Clip) ×5 IMPLANT
BAG DECANTER FOR FLEXI CONT (MISCELLANEOUS) ×5 IMPLANT
BANDAGE ACE 4X5 VEL STRL LF (GAUZE/BANDAGES/DRESSINGS) IMPLANT
BANDAGE ACE 6X5 VEL STRL LF (GAUZE/BANDAGES/DRESSINGS) IMPLANT
BASKET HEART  (ORDER IN 25'S) (MISCELLANEOUS)
BASKET HEART (ORDER IN 25'S) (MISCELLANEOUS)
BASKET HEART (ORDER IN 25S) (MISCELLANEOUS) IMPLANT
BLADE CLIPPER SURG (BLADE) IMPLANT
BLADE STERNUM SYSTEM 6 (BLADE) ×5 IMPLANT
BLADE SURG 12 STRL SS (BLADE) ×5 IMPLANT
BLADE SURG 15 STRL LF DISP TIS (BLADE) ×3 IMPLANT
BLADE SURG 15 STRL SS (BLADE) ×2
BNDG GAUZE ELAST 4 BULKY (GAUZE/BANDAGES/DRESSINGS) IMPLANT
CANISTER SUCT 3000ML PPV (MISCELLANEOUS) ×5 IMPLANT
CANN PRFSN 3/8X14X24FR PCFC (MISCELLANEOUS) ×3
CANN PRFSN 3/8XRT ANG TPR 14 (MISCELLANEOUS) ×3
CANNULA AORTIC ROOT 9FR (CANNULA) ×5 IMPLANT
CANNULA ARTERIAL NVNT 3/8 20FR (MISCELLANEOUS) ×5 IMPLANT
CANNULA ARTERIAL NVNT 3/8 22FR (MISCELLANEOUS) IMPLANT
CANNULA GUNDRY RCSP 15FR (MISCELLANEOUS) IMPLANT
CANNULA PRFSN 3/8X14X24FR PCFC (MISCELLANEOUS) ×3 IMPLANT
CANNULA PRFSN 3/8XRT ANG TPR14 (MISCELLANEOUS) ×3 IMPLANT
CANNULA SUMP PERICARDIAL (CANNULA) ×5 IMPLANT
CANNULA VEN MTL TIP RT (MISCELLANEOUS) ×4
CATH CPB KIT VANTRIGT (MISCELLANEOUS) IMPLANT
CATH ROBINSON RED A/P 18FR (CATHETERS) ×15 IMPLANT
CATH THORACIC 36FR RT ANG (CATHETERS) IMPLANT
COVER WAND RF STERILE (DRAPES) IMPLANT
CRADLE DONUT ADULT HEAD (MISCELLANEOUS) ×5 IMPLANT
DRAIN CHANNEL 32F RND 10.7 FF (WOUND CARE) ×5 IMPLANT
DRAPE CARDIOVASCULAR INCISE (DRAPES) ×2
DRAPE SLUSH/WARMER DISC (DRAPES) ×5 IMPLANT
DRAPE SRG 135X102X78XABS (DRAPES) ×3 IMPLANT
DRSG AQUACEL AG ADV 3.5X14 (GAUZE/BANDAGES/DRESSINGS) ×5 IMPLANT
ELECT BLADE 4.0 EZ CLEAN MEGAD (MISCELLANEOUS) ×5
ELECT BLADE 6.5 EXT (BLADE) ×5 IMPLANT
ELECT CAUTERY BLADE 6.4 (BLADE) ×5 IMPLANT
ELECT REM PT RETURN 9FT ADLT (ELECTROSURGICAL) ×10
ELECTRODE BLDE 4.0 EZ CLN MEGD (MISCELLANEOUS) ×3 IMPLANT
ELECTRODE REM PT RTRN 9FT ADLT (ELECTROSURGICAL) ×6 IMPLANT
FELT TEFLON 1X6 (MISCELLANEOUS) ×5 IMPLANT
GAUZE SPONGE 4X4 12PLY STRL (GAUZE/BANDAGES/DRESSINGS) ×5 IMPLANT
GLOVE BIO SURGEON STRL SZ7.5 (GLOVE) ×15 IMPLANT
GOWN STRL REUS W/ TWL LRG LVL3 (GOWN DISPOSABLE) ×18 IMPLANT
GOWN STRL REUS W/TWL LRG LVL3 (GOWN DISPOSABLE) ×12
HEMOSTAT POWDER SURGIFOAM 1G (HEMOSTASIS) ×15 IMPLANT
HEMOSTAT SURGICEL 2X14 (HEMOSTASIS) ×5 IMPLANT
INSERT FOGARTY XLG (MISCELLANEOUS) IMPLANT
KIT BASIN OR (CUSTOM PROCEDURE TRAY) ×5 IMPLANT
KIT SUCTION CATH 14FR (SUCTIONS) ×5 IMPLANT
KIT TURNOVER KIT B (KITS) ×5 IMPLANT
KIT VASOVIEW HEMOPRO 2 VH 4000 (KITS) ×5 IMPLANT
LEAD PACING MYOCARDI (MISCELLANEOUS) ×5 IMPLANT
LINE VENT (MISCELLANEOUS) ×5 IMPLANT
LOOP VESSEL SUPERMAXI WHITE (MISCELLANEOUS) ×5 IMPLANT
MARKER GRAFT CORONARY BYPASS (MISCELLANEOUS) IMPLANT
NEEDLE PERC 18GX7CM (NEEDLE) ×5 IMPLANT
NS IRRIG 1000ML POUR BTL (IV SOLUTION) ×25 IMPLANT
PACK E OPEN HEART (SUTURE) ×5 IMPLANT
PACK OPEN HEART (CUSTOM PROCEDURE TRAY) ×5 IMPLANT
PAD ARMBOARD 7.5X6 YLW CONV (MISCELLANEOUS) ×10 IMPLANT
PAD ELECT DEFIB RADIOL ZOLL (MISCELLANEOUS) ×5 IMPLANT
PENCIL BUTTON HOLSTER BLD 10FT (ELECTRODE) IMPLANT
POWDER SURGICEL 3.0 GRAM (HEMOSTASIS) ×5 IMPLANT
PUNCH AORTIC ROTATE 4.0MM (MISCELLANEOUS) IMPLANT
PUNCH AORTIC ROTATE 4.5MM 8IN (MISCELLANEOUS) IMPLANT
PUNCH AORTIC ROTATE 5MM 8IN (MISCELLANEOUS) IMPLANT
SEALANT SURG COSEAL 8ML (VASCULAR PRODUCTS) ×5 IMPLANT
SET CARDIOPLEGIA MPS 5001102 (MISCELLANEOUS) ×5 IMPLANT
SHEATH PINNACLE 5F 10CM (SHEATH) ×5 IMPLANT
SURGIFLO W/THROMBIN 8M KIT (HEMOSTASIS) ×5 IMPLANT
SUT BONE WAX W31G (SUTURE) ×5 IMPLANT
SUT MNCRL AB 4-0 PS2 18 (SUTURE) IMPLANT
SUT PROLENE 3 0 RB 1 (SUTURE) ×5 IMPLANT
SUT PROLENE 3 0 SH DA (SUTURE) ×5 IMPLANT
SUT PROLENE 3 0 SH1 36 (SUTURE) IMPLANT
SUT PROLENE 4 0 RB 1 (SUTURE) ×12
SUT PROLENE 4 0 SH DA (SUTURE) ×10 IMPLANT
SUT PROLENE 4-0 RB1 .5 CRCL 36 (SUTURE) ×18 IMPLANT
SUT PROLENE 5 0 C 1 36 (SUTURE) IMPLANT
SUT PROLENE 6 0 C 1 30 (SUTURE) IMPLANT
SUT PROLENE 6 0 CC (SUTURE) ×10 IMPLANT
SUT PROLENE 8 0 BV175 6 (SUTURE) IMPLANT
SUT PROLENE BLUE 7 0 (SUTURE) IMPLANT
SUT SILK  1 MH (SUTURE)
SUT SILK 1 MH (SUTURE) IMPLANT
SUT SILK 2 0 SH CR/8 (SUTURE) ×5 IMPLANT
SUT SILK 3 0 SH CR/8 (SUTURE) IMPLANT
SUT STEEL 6MS V (SUTURE) ×5 IMPLANT
SUT STEEL SZ 6 DBL 3X14 BALL (SUTURE) ×5 IMPLANT
SUT VIC AB 1 CTX 36 (SUTURE) ×4
SUT VIC AB 1 CTX36XBRD ANBCTR (SUTURE) ×6 IMPLANT
SUT VIC AB 2-0 CT1 27 (SUTURE)
SUT VIC AB 2-0 CT1 TAPERPNT 27 (SUTURE) IMPLANT
SUT VIC AB 2-0 CTX 27 (SUTURE) IMPLANT
SUT VIC AB 3-0 X1 27 (SUTURE) IMPLANT
SYSTEM SAHARA CHEST DRAIN ATS (WOUND CARE) ×5 IMPLANT
TAPE CLOTH SURG 4X10 WHT LF (GAUZE/BANDAGES/DRESSINGS) ×5 IMPLANT
TAPE PAPER 2X10 WHT MICROPORE (GAUZE/BANDAGES/DRESSINGS) ×5 IMPLANT
TOWEL GREEN STERILE (TOWEL DISPOSABLE) ×5 IMPLANT
TOWEL GREEN STERILE FF (TOWEL DISPOSABLE) ×5 IMPLANT
TRAY CATH LUMEN 1 20CM STRL (SET/KITS/TRAYS/PACK) ×5 IMPLANT
TRAY FOLEY SLVR 16FR TEMP STAT (SET/KITS/TRAYS/PACK) ×5 IMPLANT
TUBE CONNECTING 12'X1/4 (SUCTIONS) ×1
TUBE CONNECTING 12X1/4 (SUCTIONS) ×4 IMPLANT
TUBE SUCT INTRACARD DLP 20F (MISCELLANEOUS) ×5 IMPLANT
TUBING INSUFFLATION (TUBING) ×5 IMPLANT
UNDERPAD 30X30 (UNDERPADS AND DIAPERS) ×5 IMPLANT
WATER STERILE IRR 1000ML POUR (IV SOLUTION) ×10 IMPLANT
WIRE EMERALD 3MM-J .035X150CM (WIRE) ×5 IMPLANT
YANKAUER SUCT BULB TIP NO VENT (SUCTIONS) ×5 IMPLANT

## 2018-09-19 NOTE — Progress Notes (Signed)
   NAMEIlia Sloan, MRN:  [id number], DOB:  Nov 19, 1970, LOS: 3 ADMISSION DATE:  09/16/2018, CONSULTATION DATE:  09/16/2018 REFERRING MD:  Cardiology MD - Irish Lack, CHIEF COMPLAINT:  Mobile RA clot   Brief History   47 year old male with PMH of HTN and polycystic kidney who presents to [facility] via cardiology with the chief complaint of syncope.  Patient was picking up hi smedications from the pharmacy when he had a syncopal episode.  EMS was called and the patient was noted to be hypotensive at the time and tachycardic.  Patient was brought to the ED and hypotension resolved with IVF and no O2 demand was noted.    Past Medical History  HTN, PCK, Asthma  Significant Hospital Events   11/27 > admit  Consults:  PCCM Vascular surgery TCTS  Procedures:  Coronary angiography 11/29 Closure of PFO and clot extraction.  Significant Diagnostic Tests:  TTE 11/27 > EF 45-50%, RV overload with severe dilation, RA dilation with thrombus in atrial cavity, mod TR, PAP 59. VQ 11/27 > multiple mismatched perfusion defects. CT chest 11/28 > hepatic mass measuring 3.5cm, dilated left extrarenal pelvis, PCKD. TEE 11/29 > clot crossing PFO into LA  Micro Data:  N/A  Antimicrobials:  N/A   Interim history/subjective:  Now post op from PFO closure with Dr Lucianne Lei Tright   Objective   Blood pressure (!) 124/98, pulse 71, temperature 97.6 F (36.4 C), temperature source Oral, resp. rate 18, height 5\' 9"  (1.753 m), weight 75.6 kg, SpO2 100 %. CVP:  [4 mmHg] 4 mmHg  Vent Mode: SIMV;PRVC;PSV FiO2 (%):  [50 %] 50 % Set Rate:  [12 bmp] 12 bmp Vt Set:  [560 mL] 560 mL PEEP:  [5 cmH20] 5 cmH20 Pressure Support:  [10 cmH20] 10 cmH20 Plateau Pressure:  [12 cmH20-16 cmH20] 12 cmH20   Intake/Output Summary (Last 24 hours) at 09/19/2018 1833 Last data filed at 09/19/2018 1600 Gross per 24 hour  Intake 3863.67 ml  Output 2047 ml  Net 1816.67 ml   Filed Weights   09/16/18 1143 09/16/18 2230 09/18/18  1103  Weight: 77.3 kg 75.6 kg 75.6 kg    Examination: General: Adult male, resting in bed, in NAD. Neuro: Sedated with dexmedetomidine HEENT: ETT OGT in place. Cardiovascular: RRR, no M/R/G. Minimal chest tube drainage. On phenylephrine Lungs: Respirations even and unlabored.  CTA bilaterally, No W/R/R. Abdomen: BS x 4, soft, NT/ND.  Musculoskeletal: No gross deformities, no edema.  Skin: Intact, warm, no rashes.   Assessment & Plan:   Acute Pulmonary Emboli (Per VQ scan) w/ large mobile mass in the RA - Seen by vascular surgery, concerned that he may have an atrial mass, and clot propagated from this mass resulting in the pulmonary emboli. Now status post clot removal and PFO closure by TCTS. A.fib with RVR. Liver mass. Hx of EtOH abuse drinks 1/5 a day. Chronic kidney disease with history of polycystic kidney disease.   Plan Immediate post-operative management by TCTS. We will gladly re-assume care at Dr Lucianne Lei Trigt's discretion. Still potentially at risk for further clot and should be re-anticoagulated as soon as deemed safe. Venous Doppler will enable to assess risk of further clot in short term and whether IVC filter might be required.   Kipp Brood, MD Staff Critical Care Physician [facility] Pager: [phone] Off hours: [phone]  09/19/2018, 6:33 PM

## 2018-09-19 NOTE — Progress Notes (Signed)
[place] for heparin Indication: biatrial thrombus   Allergies  Allergen Reactions  . Lisinopril Swelling    Lip swelling    Patient Measurements: Height: 5\' 9"  (175.3 cm) Weight: 166 lb 10.7 oz (75.6 kg) IBW/kg (Calculated) : 70.7 Heparin Dosing Weight: 77kg  Vital Signs: Temp: 98.6 F (37 C) (11/30 0404) Temp Source: Oral (11/30 0404) BP: 141/103 (11/30 1430) Pulse Rate: 82 (11/30 1430)  Labs: Recent Labs    09/17/18 2044  09/18/18 0434 09/19/18 0449  09/19/18 1130 09/19/18 1150 09/19/18 1152 09/19/18 1250 09/19/18 1414  HGB  --   --  12.5* 12.1*   < > 11.2* 9.1* 9.5* 9.5*  --   HCT  --   --  37.9* 35.7*   < > 33.0* 27.0* 28.0* 28.0*  --   PLT  --   --  175 168  --   --  143*  --   --   --   APTT  --   --   --   --   --   --   --   --   --  26  LABPROT  --   --   --   --   --   --   --   --   --  15.2  INR  --   --   --   --   --   --   --   --   --  1.22  HEPARINUNFRC 0.48  --  0.44 0.25*  --   --   --   --   --   --   CREATININE  --    < > 1.93* 1.93*   < > 1.60*  --  1.50* 1.50*  --    < > = values in this interval not displayed.    Estimated Creatinine Clearance: 62.2 mL/min (A) (by C-G formula based on SCr of 1.5 mg/dL (H)).  Assessment: 47 yo m presenting with acute PE diagnosed by VQ, large mobile mass in RA. Not on anticoagulation PTA. Returns from surgery today. Pharmacy consulted to restart heparin.  Discussed heparin dosing with Dr. Prescott Gum. Start heparin at 800 units/hr.    Goal of Therapy:  Heparin level ~0.3 units/ml, in immediate postop setting Monitor platelets by anticoagulation protocol: Yes   Plan:  Restart heparin at 800 units/hr at 2000 Heparin level in 6 hours. Do no titrate above 1000 units/hr. Monitor daily heparin level and CBC, s/sx bleeding   Claiborne Billings, PharmD PGY2 Cardiology Pharmacy Resident Phone [phone] Please check AMION for all Pharmacist numbers by  unit 09/19/2018 3:01 PM

## 2018-09-19 NOTE — Progress Notes (Signed)
RN verified the presence of a signed informed consent that matches stated procedure by patient. Verified armband matches patient's stated name and birth date. Verified NPO status and that all jewelry, contact, glasses, dentures, and partials had been removed (if applicable).  

## 2018-09-19 NOTE — Brief Op Note (Signed)
09/16/2018 - 09/19/2018  12:25 PM  PATIENT:  [name]  47 y.o. male  PRE-OPERATIVE DIAGNOSIS:  Atrial Thrombus, PFO,pulmonary emboli, paradoxical embolus  POST-OPERATIVE DIAGNOSIS: Bi-atrial thrombus seen yesterday not present today on TEEwith 5-73mm thrombus noted in R pulmonary artery. PFO noted on today TEE with R to L mild flow.   Findings- the patient's biatrial thrombus by TEE today was noted to have dissipated with some thrombus noted in the right pulmonary artery.  The patient had RV distention and high right-sided pressures with right to left flow across a PFO.  For that reason we proceeded with sternotomy, cardiopulmonary bypass, and closure of PFO to prevent further risk of paradoxical embolus and stroke as the patient previously demonstrated thrombus extending through the PFO into the left atrium in the area of the mitral valve apparatus .  I felt that the risk of potentially debilitating stroke to this patient was higher than the risk of surgery in the presence of his current known pulmonary embolic disease with some right ventricular strain.  With anticoagulation the patient's pulmonary embolic disease and RV dysfunction should be reversible but if he had a stroke the consequences could be long-term and devastating.  PROCEDURE:  Procedure(s): CLOSURE OF PATENT FORAMEN OVALE (N/A) INTRAOPERATIVE TRANSESOPHAGEAL ECHOCARDIOGRAM (N/A) CLIPPING OF LEFT ATRIAL APPENDAGE    SURGEON:  Surgeon(s) and Role:    Ivin Poot, MD - Primary  PHYSICIAN ASSISTANT: WAYNE GOLD PA-C  ANESTHESIA:   general  EBL:  200cc   BLOOD ADMINISTERED:none  DRAINS: PERICARDIAL DRAINS -anterior LOCAL MEDICATIONS USED:  NONE  SPECIMEN:  No Specimen  DISPOSITION OF SPECIMEN:  N/A  COUNTS:  YES  TOURNIQUET:  * No tourniquets in log *  DICTATION: .Other Dictation: Dictation Number PENDING  PLAN OF CARE: Admit to inpatient   PATIENT DISPOSITION:  ICU - intubated and hemodynamically  stable.   Delay start of Pharmacological VTE agent (>24hrs) due to surgical blood loss or risk of bleeding: yes.  Initiating postoperative IV heparin [starting at lower than  preoperative dose] was discussed with Colette Ribas.D. for coordination of care.  Will not resume full strength heparin until 18-24 hours after surgery.  COMPLICATIONS: NO KNOWN

## 2018-09-19 NOTE — Anesthesia Procedure Notes (Signed)
Procedure Name: Intubation Date/Time: 09/19/2018 10:04 AM Performed by: Inda Coke, CRNA Pre-anesthesia Checklist: Patient identified, Emergency Drugs available, Suction available and Patient being monitored Patient Re-evaluated:Patient Re-evaluated prior to induction Oxygen Delivery Method: Circle System Utilized Preoxygenation: Pre-oxygenation with 100% oxygen Induction Type: IV induction Ventilation: Mask ventilation without difficulty Laryngoscope Size: Mac and 4 Grade View: Grade I Tube type: Oral Tube size: 8.0 mm Number of attempts: 1 Airway Equipment and Method: Stylet and Oral airway Placement Confirmation: ETT inserted through vocal cords under direct vision,  positive ETCO2 and breath sounds checked- equal and bilateral Secured at: 22 cm Tube secured with: Tape Dental Injury: Teeth and Oropharynx as per pre-operative assessment

## 2018-09-19 NOTE — OR Nursing (Signed)
Forty-five minute call to [place] at 1307.

## 2018-09-19 NOTE — Progress Notes (Signed)
  Echocardiogram Echocardiogram Transesophageal has been performed.  [name] 09/19/2018, 11:15 AM

## 2018-09-19 NOTE — Transfer of Care (Signed)
Immediate Anesthesia Transfer of Care Note  Patient: [name]  Procedure(s) Performed: CLOSURE OF PATENT FORAMEN OVALE (N/A ) INTRAOPERATIVE TRANSESOPHAGEAL ECHOCARDIOGRAM (N/A ) CLIPPING OF LEFT ATRIAL APPENDAGE  Patient Location: SICU  Anesthesia Type:General  Level of Consciousness: Patient remains intubated per anesthesia plan  Airway & Oxygen Therapy: Patient remains intubated per anesthesia plan and Patient placed on Ventilator (see vital sign flow sheet for setting)  Post-op Assessment: Report given to RN and Post -op Vital signs reviewed and stable  Post vital signs: Reviewed and stable  Last Vitals:  Vitals Value Taken Time  BP 159/114 09/19/2018  2:21 PM  Temp    Pulse 74 09/19/2018  2:23 PM  Resp 21 09/19/2018  2:23 PM  SpO2 99 % 09/19/2018  2:23 PM  Vitals shown include unvalidated device data.  Last Pain:  Vitals:   09/19/18 0404  TempSrc: Oral  PainSc:          Complications: No apparent anesthesia complications

## 2018-09-19 NOTE — Progress Notes (Signed)
CT surgery p.m. Rounds  Hemodynamically stable after closure of PFO left atrial clip for biatrial thrombus, paradoxical embolus, pulmonary emboli and RV dysfunction. Heparin starting approximately 6 hours postop at 800 units/h. Patient remained intubated because of severe COPD and recent pulmonary emboli.

## 2018-09-19 NOTE — Anesthesia Postprocedure Evaluation (Signed)
Anesthesia Post Note  Patient: [name]  Procedure(s) Performed: CLOSURE OF PATENT FORAMEN OVALE (N/A ) INTRAOPERATIVE TRANSESOPHAGEAL ECHOCARDIOGRAM (N/A ) CLIPPING OF LEFT ATRIAL APPENDAGE     Patient location during evaluation: SICU Anesthesia Type: General Level of consciousness: sedated Pain management: pain level controlled Vital Signs Assessment: post-procedure vital signs reviewed and stable Respiratory status: patient remains intubated per anesthesia plan Cardiovascular status: stable Postop Assessment: no apparent nausea or vomiting Anesthetic complications: no    Last Vitals:  Vitals:   09/19/18 2000 09/19/18 2015  BP: 94/67 (!) 139/100  Pulse: 69 63  Resp: 17 16  Temp:    SpO2: 100% 100%    Last Pain:  Vitals:   09/19/18 1845  TempSrc: Oral  PainSc:                  Dock Baccam P Corena Tilson

## 2018-09-19 NOTE — Anesthesia Procedure Notes (Signed)
Arterial Line Insertion Start/End11/30/2019 8:45 AM, 09/19/2018 8:50 AM Performed by: Inda Coke, CRNA, CRNA  Preanesthetic checklist: patient identified, IV checked, site marked, risks and benefits discussed, surgical consent, monitors and equipment checked, pre-op evaluation, timeout performed and anesthesia consent Lidocaine 1% used for infiltration Left, radial was placed Catheter size: 20 G Hand hygiene performed  and maximum sterile barriers used  Allen's test indicative of satisfactory collateral circulation Attempts: 2 Procedure performed without using ultrasound guided technique. Ultrasound Notes:anatomy identified Following insertion, dressing applied and Biopatch. Post procedure assessment: normal  Patient tolerated the procedure well with no immediate complications.

## 2018-09-19 NOTE — Anesthesia Preprocedure Evaluation (Addendum)
Anesthesia Evaluation  Patient identified by MRN, date of birth, ID band Patient awake    Reviewed: Allergy & Precautions, NPO status , Patient's Chart, lab work & pertinent test results  Airway Mallampati: III  TM Distance: >3 FB Neck ROM: Full    Dental no notable dental hx.    Pulmonary asthma , Current Smoker,    Pulmonary exam normal breath sounds clear to auscultation       Cardiovascular hypertension, Pt. on medications Normal cardiovascular exam Rhythm:Regular Rate:Normal  ECG: rate 168. Sinus tachycardia Atrial premature complexes Prolonged PR interval Prominent P waves, nondiagnostic  ECHO: Left ventricle: Systolic function was mildly reduced. The estimated ejection fraction was in the range of 45% to 50%. Wall motion was normal; there were no regional wall motion abnormalities. There is paradoxical septal motion due to right ventricular pressure overload. Left atrium: No evidence of thrombus in the appendage. Right ventricle: The cavity size was moderately dilated. Systolic function was moderately reduced. Right atrium: The atrium was dilated. Atrial septum: There was a large, 11 cm (L) x 0.7 cm (W), cylindrical mobile thrombus straddling an atrial septal defect/patent foramen ovale in the area of the fossa ovalis. Approximately 4.5 cm of the thrombus is on the right atrial side. At least 6.5 cm of the thrombus is in the left atrium. It is highly mobile and readily prolapses across the mitral valve into the left ventricle in diastole. There is no evidence of attachment to any structures in the inferior or superior vena cava. Tricuspid valve: There was mild-moderate regurgitation directed centrally. Impressions: Very large and highly mobile thrombus in transit across the atrial septum. The shape and behavior of the mass is consistent with an embolized iliac or femoral DVT. The right heart findings are consistent with  recent pulmonary embolism.  CATH: LV end diastolic pressure is normal. There is no aortic valve stenosis. No significant CAD.    Neuro/Psych negative neurological ROS  negative psych ROS   GI/Hepatic negative GI ROS, Neg liver ROS,   Endo/Other  negative endocrine ROS  Renal/GU Renal InsufficiencyRenal disease     Musculoskeletal negative musculoskeletal ROS (+)   Abdominal   Peds  Hematology  (+) anemia ,   Anesthesia Other Findings Atrial Thrombus  Reproductive/Obstetrics                            Anesthesia Physical Anesthesia Plan  ASA: IV  Anesthesia Plan: General   Post-op Pain Management:    Induction: Intravenous  PONV Risk Score and Plan: 1 and Midazolam and Treatment may vary due to age or medical condition  Airway Management Planned: Oral ETT  Additional Equipment: Arterial line, CVP, TEE and Ultrasound Guidance Line Placement  Intra-op Plan:   Post-operative Plan: Post-operative intubation/ventilation  Informed Consent: I have reviewed the patients History and Physical, chart, labs and discussed the procedure including the risks, benefits and alternatives for the proposed anesthesia with the patient or authorized representative who has indicated his/her understanding and acceptance.   Dental advisory given  Plan Discussed with: CRNA  Anesthesia Plan Comments:         Anesthesia Quick Evaluation

## 2018-09-19 NOTE — OR Nursing (Signed)
Twenty minute call to [place] charge nurse at [phone]. Spoke to [place].

## 2018-09-19 NOTE — Progress Notes (Signed)
Pre Procedure note for inpatients:   [name] has been scheduled for Procedure(s): RESECTION OF BI-ATRIAL THROMBUS (N/A) CLOSURE OF PATENT FORAMEN OVALE (N/A) INTRAOPERATIVE TRANSESOPHAGEAL ECHOCARDIOGRAM (N/A) today. The various methods of treatment have been discussed with the patient. After consideration of the risks, benefits and treatment options the patient has consented to the planned procedure.   The patient has been seen and labs reviewed. There are no changes in the patient's condition to prevent proceeding with the planned procedure today.  Recent labs:  Lab Results  Component Value Date   WBC 4.9 09/19/2018   HGB 12.1 (L) 09/19/2018   HCT 35.7 (L) 09/19/2018   PLT 168 09/19/2018   GLUCOSE 97 09/19/2018   ALT 25 09/06/2017   AST 20 09/06/2017   NA 137 09/19/2018   K 3.8 09/19/2018   CL 106 09/19/2018   CREATININE 1.93 (H) 09/19/2018   BUN 17 09/19/2018   CO2 19 (L) 09/19/2018    Ivin Poot III, MD 09/19/2018 8:00 AM

## 2018-09-19 NOTE — Anesthesia Procedure Notes (Signed)
Central Venous Catheter Insertion Performed by: Murvin Natal, MD, anesthesiologist Start/End11/30/2019 9:10 AM, 09/19/2018 9:20 AM Patient location: Pre-op. Preanesthetic checklist: patient identified, IV checked, site marked, risks and benefits discussed, surgical consent, monitors and equipment checked, pre-op evaluation, timeout performed and anesthesia consent Lidocaine 1% used for infiltration and patient sedated Hand hygiene performed , maximum sterile barriers used  and Seldinger technique used Catheter size: 8.5 Fr Total catheter length 10. Sheath introducer Procedure performed using ultrasound guided technique. Ultrasound Notes:anatomy identified, needle tip was noted to be adjacent to the nerve/plexus identified, no ultrasound evidence of intravascular and/or intraneural injection and image(s) printed for medical record Attempts: 1 Following insertion, line sutured, dressing applied and Biopatch. Post procedure assessment: blood return through all ports, free fluid flow and no air  Patient tolerated the procedure well with no immediate complications.

## 2018-09-20 ENCOUNTER — Inpatient Hospital Stay (HOSPITAL_COMMUNITY): Payer: Self-pay

## 2018-09-20 ENCOUNTER — Encounter (HOSPITAL_COMMUNITY): Payer: Self-pay | Admitting: Cardiovascular Disease

## 2018-09-20 DIAGNOSIS — I82409 Acute embolism and thrombosis of unspecified deep veins of unspecified lower extremity: Secondary | ICD-10-CM

## 2018-09-20 DIAGNOSIS — J9601 Acute respiratory failure with hypoxia: Secondary | ICD-10-CM

## 2018-09-20 DIAGNOSIS — R579 Shock, unspecified: Secondary | ICD-10-CM

## 2018-09-20 LAB — BLOOD GAS, ARTERIAL
Acid-base deficit: 4.5 mmol/L — ABNORMAL HIGH (ref 0.0–2.0)
Bicarbonate: 19.4 mmol/L — ABNORMAL LOW (ref 20.0–28.0)
O2 Content: 6 L/min
O2 Saturation: 96.8 %
Patient temperature: 98.6
pCO2 arterial: 31.7 mmHg — ABNORMAL LOW (ref 32.0–48.0)
pH, Arterial: 7.403 (ref 7.350–7.450)
pO2, Arterial: 96.4 mmHg (ref 83.0–108.0)

## 2018-09-20 LAB — POCT I-STAT 3, ART BLOOD GAS (G3+)
Acid-base deficit: 5 mmol/L — ABNORMAL HIGH (ref 0.0–2.0)
Acid-base deficit: 6 mmol/L — ABNORMAL HIGH (ref 0.0–2.0)
Acid-base deficit: 3 mmol/L — ABNORMAL HIGH (ref 0.0–2.0)
Bicarbonate: 20 mmol/L (ref 20.0–28.0)
Bicarbonate: 18.4 mmol/L — ABNORMAL LOW (ref 20.0–28.0)
Bicarbonate: 21.2 mmol/L (ref 20.0–28.0)
O2 Saturation: 95 %
O2 Saturation: 97 %
O2 Saturation: 94 %
Patient temperature: 37
Patient temperature: 98.6
TCO2: 21 mmol/L — ABNORMAL LOW (ref 22–32)
TCO2: 19 mmol/L — ABNORMAL LOW (ref 22–32)
TCO2: 22 mmol/L (ref 22–32)
pCO2 arterial: 35.7 mmHg (ref 32.0–48.0)
pCO2 arterial: 33.7 mmHg (ref 32.0–48.0)
pCO2 arterial: 33.7 mmHg (ref 32.0–48.0)
pH, Arterial: 7.355 (ref 7.350–7.450)
pH, Arterial: 7.345 — ABNORMAL LOW (ref 7.350–7.450)
pH, Arterial: 7.406 (ref 7.350–7.450)
pO2, Arterial: 76 mmHg — ABNORMAL LOW (ref 83.0–108.0)
pO2, Arterial: 94 mmHg (ref 83.0–108.0)
pO2, Arterial: 70 mmHg — ABNORMAL LOW (ref 83.0–108.0)

## 2018-09-20 LAB — POCT I-STAT, CHEM 8
BUN: 17 mg/dL (ref 6–20)
Calcium, Ion: 1.15 mmol/L (ref 1.15–1.40)
Chloride: 110 mmol/L (ref 98–111)
Creatinine, Ser: 2 mg/dL — ABNORMAL HIGH (ref 0.61–1.24)
Glucose, Bld: 110 mg/dL — ABNORMAL HIGH (ref 70–99)
HCT: 31 % — ABNORMAL LOW (ref 39.0–52.0)
Hemoglobin: 10.5 g/dL — ABNORMAL LOW (ref 13.0–17.0)
Potassium: 4.6 mmol/L (ref 3.5–5.1)
Sodium: 140 mmol/L (ref 135–145)
TCO2: 21 mmol/L — ABNORMAL LOW (ref 22–32)

## 2018-09-20 LAB — COMPREHENSIVE METABOLIC PANEL
ALT: 12 U/L (ref 0–44)
AST: 16 U/L (ref 15–41)
Albumin: 3 g/dL — ABNORMAL LOW (ref 3.5–5.0)
Alkaline Phosphatase: 34 U/L — ABNORMAL LOW (ref 38–126)
Anion gap: 9 (ref 5–15)
BUN: 15 mg/dL (ref 6–20)
CO2: 20 mmol/L — ABNORMAL LOW (ref 22–32)
Calcium: 7.9 mg/dL — ABNORMAL LOW (ref 8.9–10.3)
Chloride: 108 mmol/L (ref 98–111)
Creatinine, Ser: 1.75 mg/dL — ABNORMAL HIGH (ref 0.61–1.24)
GFR calc Af Amer: 53 mL/min — ABNORMAL LOW (ref >60)
GFR calc non Af Amer: 46 mL/min — ABNORMAL LOW (ref >60)
Glucose, Bld: 117 mg/dL — ABNORMAL HIGH (ref 70–99)
Potassium: 4.6 mmol/L (ref 3.5–5.1)
Sodium: 137 mmol/L (ref 135–145)
Total Bilirubin: 1.1 mg/dL (ref 0.3–1.2)
Total Protein: 5.5 g/dL — ABNORMAL LOW (ref 6.5–8.1)

## 2018-09-20 LAB — CBC
HCT: 34.1 % — ABNORMAL LOW (ref 39.0–52.0)
Hemoglobin: 11.5 g/dL — ABNORMAL LOW (ref 13.0–17.0)
MCH: 33.9 pg (ref 26.0–34.0)
MCHC: 33.7 g/dL (ref 30.0–36.0)
MCV: 100.6 fL — AB (ref 80.0–100.0)
PLATELETS: 164 10*3/uL (ref 150–400)
RBC: 3.39 MIL/uL — ABNORMAL LOW (ref 4.22–5.81)
RDW: 12.2 % (ref 11.5–15.5)
WBC: 5.4 10*3/uL (ref 4.0–10.5)
nRBC: 0 % (ref 0.0–0.2)

## 2018-09-20 LAB — COOXEMETRY PANEL
Carboxyhemoglobin: 0.9 % (ref 0.5–1.5)
Carboxyhemoglobin: 1.1 % (ref 0.5–1.5)
Methemoglobin: 1.7 % — ABNORMAL HIGH (ref 0.0–1.5)
Methemoglobin: 1.6 % — ABNORMAL HIGH (ref 0.0–1.5)
O2 Saturation: 61 %
O2 Saturation: 59.7 %
Total hemoglobin: 12 g/dL (ref 12.0–16.0)
Total hemoglobin: 12.7 g/dL (ref 12.0–16.0)

## 2018-09-20 LAB — CREATININE, SERUM
Creatinine, Ser: 2.06 mg/dL — ABNORMAL HIGH (ref 0.61–1.24)
GFR calc Af Amer: 44 mL/min — ABNORMAL LOW (ref >60)
GFR calc non Af Amer: 38 mL/min — ABNORMAL LOW (ref >60)

## 2018-09-20 LAB — GLUCOSE, CAPILLARY
Glucose-Capillary: 107 mg/dL — ABNORMAL HIGH (ref 70–99)
Glucose-Capillary: 110 mg/dL — ABNORMAL HIGH (ref 70–99)
Glucose-Capillary: 112 mg/dL — ABNORMAL HIGH (ref 70–99)
Glucose-Capillary: 124 mg/dL — ABNORMAL HIGH (ref 70–99)
Glucose-Capillary: 127 mg/dL — ABNORMAL HIGH (ref 70–99)
Glucose-Capillary: 99 mg/dL (ref 70–99)

## 2018-09-20 LAB — MAGNESIUM
Magnesium: 2.5 mg/dL — ABNORMAL HIGH (ref 1.7–2.4)
Magnesium: 2.4 mg/dL (ref 1.7–2.4)

## 2018-09-20 LAB — HEPARIN LEVEL (UNFRACTIONATED)
Heparin Unfractionated: <0.1 IU/mL — ABNORMAL LOW (ref 0.30–0.70)
Heparin Unfractionated: <0.1 IU/mL — ABNORMAL LOW (ref 0.30–0.70)

## 2018-09-20 MED ORDER — LORAZEPAM 2 MG/ML IJ SOLN: 1.0000 mg | INTRAMUSCULAR | Status: DC | PRN

## 2018-09-20 MED ORDER — SODIUM CHLORIDE 0.9 % IV SOLN
1.0000 g | Freq: Two times a day (BID) | INTRAVENOUS | Status: DC
  Administered 2018-09-21 – 2018-09-24 (×9): 1 g via INTRAVENOUS
  Filled 2018-09-20 (×11): qty 1

## 2018-09-20 MED ORDER — ACETYLCYSTEINE 20 % IN SOLN
2.0000 mL | Freq: Two times a day (BID) | RESPIRATORY_TRACT | Status: DC
  Administered 2018-09-21: 2 mL via RESPIRATORY_TRACT
  Administered 2018-09-22: 4 mL via RESPIRATORY_TRACT
  Administered 2018-09-22 – 2018-09-24 (×5): 2 mL via RESPIRATORY_TRACT
  Filled 2018-09-20 (×7): qty 4
  Filled 2018-09-20: qty 2
  Filled 2018-09-20: qty 4
  Filled 2018-09-20: qty 2
  Filled 2018-09-20: qty 4

## 2018-09-20 MED ORDER — MORPHINE SULFATE (PF) 2 MG/ML IV SOLN
1.0000 mg | INTRAVENOUS | Status: DC | PRN
  Filled 2018-09-20: qty 1

## 2018-09-20 MED ORDER — SODIUM BICARBONATE 8.4 % IV SOLN
50.0000 meq | Freq: Once | INTRAVENOUS | Status: AC
  Administered 2018-09-20: 50 meq via INTRAVENOUS

## 2018-09-20 MED ORDER — SODIUM BICARBONATE 8.4 % IV SOLN
INTRAVENOUS | Status: AC
  Filled 2018-09-20: qty 50

## 2018-09-20 MED ORDER — MORPHINE SULFATE (PF) 2 MG/ML IV SOLN
1.0000 mg | INTRAVENOUS | Status: DC | PRN
  Administered 2018-09-20: 1 mg via INTRAVENOUS
  Administered 2018-09-20 – 2018-09-21 (×3): 2 mg via INTRAVENOUS
  Filled 2018-09-20 (×4): qty 1

## 2018-09-20 MED ORDER — ACETYLCYSTEINE 10 % IN SOLN: 2.0000 mL | RESPIRATORY_TRACT | Status: DC

## 2018-09-20 MED ORDER — FUROSEMIDE 10 MG/ML IJ SOLN
20.0000 mg | Freq: Two times a day (BID) | INTRAMUSCULAR | Status: DC
  Administered 2018-09-20: 20 mg via INTRAVENOUS
  Filled 2018-09-20: qty 2

## 2018-09-20 MED ORDER — ORAL CARE MOUTH RINSE
15.0000 mL | Freq: Two times a day (BID) | OROMUCOSAL | Status: DC
  Administered 2018-09-20 – 2018-09-25 (×9): 15 mL via OROMUCOSAL

## 2018-09-20 MED ORDER — SODIUM BICARBONATE 8.4 % IV SOLN
25.0000 meq | Freq: Once | INTRAVENOUS | Status: AC
  Administered 2018-09-20: 25 meq via INTRAVENOUS
  Filled 2018-09-20: qty 50

## 2018-09-20 NOTE — Procedures (Signed)
Extubation Procedure Note  Patient Details:   Name: [name] DOB: [birth year] MRN: [id number]   Airway Documentation:    Vent end date: 09/20/18 Vent end time: 0920   Evaluation  O2 sats: stable throughout Complications: No apparent complications Patient did tolerate procedure well. Bilateral Breath Sounds: Clear, Diminished   Yes   Patient extubated to 4L [place] without complications. Positive cuff leak and no stridor noted. RN at bedside. Will continue to monitor.  [name] 09/20/2018, 9:23 AM

## 2018-09-20 NOTE — Op Note (Signed)
[name], Sloan MEDICAL RECORD KK:[id number] ACCOUNT [id number] DATE OF BIRTH:18-Dec-1970 FACILITY: [facility] LOCATION: [facility] PHYSICIAN:Emmarose Klinke VAN TRIGT III, MD  OPERATIVE REPORT  DATE OF PROCEDURE:  09/19/2018  OPERATION: 1.  Sternotomy, cannulation for cardiopulmonary bypass, right atriotomy for closure of patent foramen ovale, left atrial clip 40 mm device.  SURGEON:  Ivin Poot, III, MD  ASSISTANT:  Jadene Pierini, PA-C  PREOPERATIVE DIAGNOSES:  History of biatrial thrombus extending through a patent foramen ovale with CT evidence of probable right pulmonary embolus and clinical evidence of probable paradoxical embolus causing the patient's syncopal episode on  presentation.  POSTOPERATIVE DIAGNOSES:  History of biatrial thrombus extending through a patent foramen ovale with CT evidence of probable right pulmonary embolus and clinical evidence of probable paradoxical embolus causing the patient's syncopal episode on  presentation.  ANESTHESIA:  General by Dr. Adele Barthel.  DESCRIPTION OF PROCEDURE:  The patient was brought from preop holding where informed consent was documented.  I discussed the procedure with the patient who was alert and responsive and somewhat anxious, but neurologically intact.  The patient was  brought back to the operating room and placed supine on the operating table.  A central line and a radial A-line were placed.  A PA catheter was not placed due to history of recent right atrial clot and pulmonary emboli.  TEE was placed.  I reviewed the TEE with the patient's anesthesiologist for coordination of care.  At the time of surgery, the thrombus previously seen extending from the right atrium through the PFO into the left atrium had resolved.  The PFO was still patent without  evidence of right-to-left flow.  There was RV dilatation, pericardial effusion, and evidence of elevated right-sided pressures on TEE.  LV function was maintained.  There was  evidence of possible thrombus in the tip of the left atrial appendage.  I discussed the situation with the anesthesiologist and decided to proceed with surgery to close the PFO.  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  I considered the risk of proceeding with surgery in the face of some RV dysfunction and recent pulmonary emboli, but I felt that the  risk of leaving the PFO opened exposing the patient to potential stroke and catastrophic neurologic deficit justified the risk of RV dysfunction, recent pulmonary emboli, both of which should be transient without long-term deficit.  The patient was then prepped and draped as a sterile field.  A proper time-out was performed.  A sternal incision was made.  The sternum was retracted.  The pericardium was opened.  There was a moderate pericardial effusion, which was drained.  The RV  was dilated and hypocontractile with a CVP of 14 cm of water.  Blood pressure was stable.  Heparin was administered.  Pursestrings were placed in the ascending aorta, vena cava and inferior vena cava.  The patient was cannulated for bicaval drainage and  caval tapes were placed.  The patient was not cooled.  He was placed on cardiopulmonary bypass.  First, the left atrial appendage was examined.  There was some erythema and induration of the tip and a 40 mm clip was applied at the base of the appendage.  Next, while the patient was empty beating and the operative field was filled with CO2, a right  atriotomy was performed as the caval tapes were tied.  There was no clot in the right atrium.  The foramen ovale measured 1.5 to 2 cm.  It was closed primarily  with 2 pledgeted 4-0 Prolene sutures.  This was reinforced with an overlapping figure-of-eight 4-0  Prolene suture.  The right atriotomy was closed in layers using running 4-0 Prolene and reinforced with a CoSeal sealing agent.  Temporary pacing wires were applied.  The lungs were expanded and ventilator was resumed.  The patient was weaned off cardiopulmonary bypass without difficulty on low-dose milrinone.  RV function appeared to be improved.  Echocardiogram showed no  evidence of residual PFO or right to left shunt.  Protamine was administered without adverse reaction.  The cannulas were removed.  The mediastinum was irrigated.  The pericardium was closed.  The anterior mediastinal drain was placed and brought out  through separate incision.  The sternum was closed with a wire.  The pectoralis fascia was closed with a running Vicryl.  Subcutaneous and skin layers were closed with a running Vicryl.  Sterile dressings were applied.  The patient then had a central  line placed in the right subclavian vein using Seldinger technique and x-ray performed on the operating room showed the line to be in good position.  The ET tube was in good position.  There was no pneumothorax.   The patient then returned to the ICU in stable condition.  AN/NUANCE  D:09/19/2018 T:09/20/2018 JOB:[id number]

## 2018-09-20 NOTE — Progress Notes (Signed)
1 Day Post-Op Procedure(s) (LRB): CLOSURE OF PATENT FORAMEN OVALE (N/A) INTRAOPERATIVE TRANSESOPHAGEAL ECHOCARDIOGRAM (N/A) CLIPPING OF LEFT ATRIAL APPENDAGE Subjective: Stable nite, now doing vent wean Heparin at 1100 Cont milrinone for RV dysfunction, PE  Objective: Vital signs in last 24 hours: Temp:  [92.8 F (33.8 C)-98.6 F (37 C)] 98.4 F (36.9 C) (12/01 0500) Pulse Rate:  [57-91] 88 (12/01 0800) Cardiac Rhythm: Atrial paced (12/01 0800) Resp:  [12-35] 25 (12/01 0800) BP: (69-148)/(54-109) 100/71 (12/01 0800) SpO2:  [96 %-100 %] 97 % (12/01 0800) Arterial Line BP: (66-145)/(43-106) 79/74 (12/01 0800) FiO2 (%):  [40 %-50 %] 40 % (12/01 0800) Weight:  [80 kg] 80 kg (12/01 0500)  Hemodynamic parameters for last 24 hours: CVP:  [3 mmHg-8 mmHg] 8 mmHg  Intake/Output from previous day: 11/30 0701 - 12/01 0700 In: 4773.6 [I.V.:2645.2; Blood:665; NG/GT:120; IV Piggyback:1343.4] Out: 2235 [Urine:1602; Blood:495; Chest Tube:138] Intake/Output this shift: Total I/O In: 70.5 [I.V.:70.5] Out: 0   EXaM Neuro intact Lungs clear abd soft Low chest tube output Lab Results: Recent Labs    09/19/18 2003 09/19/18 2009 09/20/18 0406  WBC 5.1  --  5.4  HGB 11.3* 10.9* 11.5*  HCT 33.0* 32.0* 34.1*  PLT 133*  --  164   BMET:  Recent Labs    09/19/18 0449  09/19/18 2009 09/20/18 0406  NA 137   < > 138 137  K 3.8   < > 4.9 4.6  CL 106   < > 109 108  CO2 19*  --   --  20*  GLUCOSE 97   < > 121* 117*  BUN 17   < > 15 15  CREATININE 1.93*   < > 1.70* 1.75*  CALCIUM 8.3*  --   --  7.9*   < > = values in this interval not displayed.    PT/INR:  Recent Labs    09/19/18 1414  LABPROT 15.2  INR 1.22   ABG    Component Value Date/Time   PHART 7.355 09/20/2018 0428   HCO3 20.0 09/20/2018 0428   TCO2 21 (L) 09/20/2018 0428   ACIDBASEDEF 5.0 (H) 09/20/2018 0428   O2SAT 95.0 09/20/2018 0428   CBG (last 3)  Recent Labs    09/20/18 0019 09/20/18 0426  09/20/18 0800  GLUCAP 107* 124* 99    Assessment/Plan: S/P Procedure(s) (LRB): CLOSURE OF PATENT FORAMEN OVALE (N/A) INTRAOPERATIVE TRANSESOPHAGEAL ECHOCARDIOGRAM (N/A) CLIPPING OF LEFT ATRIAL APPENDAGE extubate  mobilize   LOS: 4 days    [name] 09/20/2018

## 2018-09-20 NOTE — Progress Notes (Signed)
VASCULAR LAB PRELIMINARY  PRELIMINARY  PRELIMINARY  PRELIMINARY  Bilateral lower extremity venous duplex completed.    Preliminary report:  There is acute DVT noted in the left popliteal vein.   Incidentally, arterial waveforms in the left lower extremity, appear extremely dampened from the femoral artery through to the posterior tibial artery.  Unable to duplex anterior tibial artery.  Patient states his left leg feels cool.   Gave results to patient's RN.  [name], RVT 09/20/2018, 5:39 PM

## 2018-09-20 NOTE — Progress Notes (Signed)
[place] for heparin Indication: biatrial thrombus   Allergies  Allergen Reactions  . Lisinopril Swelling    Lip swelling    Patient Measurements: Height: 5\' 9"  (175.3 cm) Weight: 176 lb 5.9 oz (80 kg) IBW/kg (Calculated) : 70.7 Heparin Dosing Weight: 77kg  Vital Signs: Temp: 98.4 F (36.9 C) (12/01 0500) Temp Source: Esophageal (12/01 0500) BP: 102/72 (12/01 0900) Pulse Rate: 88 (12/01 0900)  Labs: Recent Labs    09/18/18 0434 09/19/18 0449  09/19/18 1414 09/19/18 2003 09/19/18 2009 09/20/18 0135 09/20/18 0406  HGB 12.5* 12.1*   < > 11.6*  12.2* 11.3* 10.9*  --  11.5*  HCT 37.9* 35.7*   < > 34.0*  35.7* 33.0* 32.0*  --  34.1*  PLT 175 168   < > 139* 133*  --   --  164  APTT  --   --   --  26  --   --   --   --   LABPROT  --   --   --  15.2  --   --   --   --   INR  --   --   --  1.22  --   --   --   --   HEPARINUNFRC 0.44 0.25*  --   --   --   --  <0.10*  --   CREATININE 1.93* 1.93*   < >  --  1.66* 1.70*  --  1.75*   < > = values in this interval not displayed.    Estimated Creatinine Clearance: 53.3 mL/min (A) (by C-G formula based on SCr of 1.75 mg/dL (H)).  Assessment: 47 yo m presenting with acute PE diagnosed by VQ, large mobile mass in RA. Not on anticoagulation PTA. Returns from surgery today. Pharmacy consulted to restart heparin.  HL undetectable on 800 units/hr. Discussed heparin dosing with Dr. Prescott Gum. Increase heparin to 1100 units/hr.  Hb low but stable. No signs/symptoms of bleeding or issues with infusion reported by nursing.   Goal of Therapy:  Heparin level ~0.3 units/ml, in immediate postop setting Monitor platelets by anticoagulation protocol: Yes   Plan:  Increase heparin to 1100 units/hr. Heparin level in 6 hours. Do no titrate above 1200 units/hr. Monitor daily heparin level and CBC, s/sx bleeding   Claiborne Billings, PharmD PGY2 Cardiology Pharmacy Resident Phone [phone] Please  check AMION for all Pharmacist numbers by unit 09/20/2018 9:10 AM

## 2018-09-20 NOTE — Progress Notes (Addendum)
Pt removed nasal cannula. Oxygen saturation 89-90%. Nasal cannula reapplied. Patient educated on oxygen requirements at this time. Will continue to monitor.  Shella Spearing, RN

## 2018-09-20 NOTE — Progress Notes (Signed)
CT surgery p.m. Rounds  Patient extubated, dangled on side of bed Hemodynamic stable Heavy airway secretions, bronchodilators and chest physiotherapy with Murvin Natal neb ordered Continue IV antibiotics Heparin infusion up to 1200 units/h Maintaining sinus rhythm P.m. labs satisfactory, creatinine baseline 2.0

## 2018-09-20 NOTE — Progress Notes (Signed)
NIF -20  VC 1.9L

## 2018-09-20 NOTE — Progress Notes (Signed)
[place] for heparin Indication: biatrial thrombus   Allergies  Allergen Reactions  . Lisinopril Swelling    Lip swelling    Patient Measurements: Height: 5\' 9"  (175.3 cm) Weight: 176 lb 5.9 oz (80 kg) IBW/kg (Calculated) : 70.7 Heparin Dosing Weight: 77kg  Vital Signs: Temp: 97.9 F (36.6 C) (12/01 1117) Temp Source: Oral (12/01 1117) BP: 103/74 (12/01 1600) Pulse Rate: 94 (12/01 1600)  Labs: Recent Labs    09/19/18 0449  09/19/18 1414 09/19/18 2003 09/19/18 2009 09/20/18 0135 09/20/18 0406 09/20/18 1602 09/20/18 1604  HGB 12.1*   < > 11.6*  12.2* 11.3* 10.9*  --  11.5*  --  10.5*  HCT 35.7*   < > 34.0*  35.7* 33.0* 32.0*  --  34.1*  --  31.0*  PLT 168   < > 139* 133*  --   --  164  --   --   APTT  --   --  26  --   --   --   --   --   --   LABPROT  --   --  15.2  --   --   --   --   --   --   INR  --   --  1.22  --   --   --   --   --   --   HEPARINUNFRC 0.25*  --   --   --   --  <0.10*  --  <0.10*  --   CREATININE 1.93*   < >  --  1.66* 1.70*  --  1.75* 2.06* 2.00*   < > = values in this interval not displayed.    Estimated Creatinine Clearance: 46.6 mL/min (A) (by C-G formula based on SCr of 2 mg/dL (H)).  Assessment: 47 yo m presenting with acute PE diagnosed by VQ, large mobile mass in RA. Not on anticoagulation PTA. Returns from surgery today. Pharmacy consulted to restart heparin.  HL remains undetectable on 1100 units/hr.  Goal of Therapy:  Heparin level ~0.3 units/ml, in immediate postop setting Monitor platelets by anticoagulation protocol: Yes   Plan:  Increase heparin to max dose of 1200 units/hr. Check an 8 hr heparin level. Do no titrate above 1200 units/hr per MD Monitor daily heparin level and CBC, s/sx bleeding  Salome Arnt, PharmD, BCPS Please see AMION for all pharmacy numbers 09/20/2018 4:58 PM

## 2018-09-20 NOTE — Progress Notes (Signed)
[name], MRN:  [id number], DOB:  1971/05/05, LOS: 4 ADMISSION DATE:  09/16/2018, CONSULTATION DATE:  09/16/2018 REFERRING MD:  Cardiology MD - Irish Lack, CHIEF COMPLAINT:  Mobile RA clot   Brief History   47 year old male with PMH of HTN and polycystic kidney who presents to [facility] via cardiology with the chief complaint of syncope.  Patient was picking up hi smedications from the pharmacy when he had a syncopal episode.  EMS was called and the patient was noted to be hypotensive at the time and tachycardic.  Patient was brought to the ED and hypotension resolved with IVF and no O2 demand was noted.    Past Medical History  HTN, PCK, Asthma  Significant Hospital Events   11/27 > admit  Consults:  PCCM Vascular surgery TCTS  Procedures:  Coronary angiography 11/29 Closure of PFO and clot extraction.  Significant Diagnostic Tests:  TTE 11/27 > EF 45-50%, RV overload with severe dilation, RA dilation with thrombus in atrial cavity, mod TR, PAP 59. VQ 11/27 > multiple mismatched perfusion defects. CT chest 11/28 > hepatic mass measuring 3.5cm, dilated left extrarenal pelvis, PCKD. TEE 11/29 > clot crossing PFO into LA  Micro Data:  N/A  Antimicrobials:  N/A   Interim history/subjective:  Tolerating wean and subsequently extubation Unable to cough  Objective   Blood pressure 117/88, pulse 88, temperature 98.4 F (36.9 C), temperature source Esophageal, resp. rate (!) 30, height 5\' 9"  (1.753 m), weight 80 kg, SpO2 96 %. CVP:  [3 mmHg-8 mmHg] 4 mmHg  Vent Mode: PSV;CPAP FiO2 (%):  [40 %-50 %] 40 % Set Rate:  [4 bmp-12 bmp] 4 bmp Vt Set:  [560 mL] 560 mL PEEP:  [5 cmH20] 5 cmH20 Pressure Support:  [10 cmH20] 10 cmH20 Plateau Pressure:  [10 cmH20-17 cmH20] 17 cmH20   Intake/Output Summary (Last 24 hours) at 09/20/2018 1007 Last data filed at 09/20/2018 0900 Gross per 24 hour  Intake 4906.59 ml  Output 2385 ml  Net 2521.59 ml   Filed Weights   09/16/18 2230  09/18/18 1103 09/20/18 0500  Weight: 75.6 kg 75.6 kg 80 kg    Examination: General: Adult male, in bed, NAD Neuro: Awake and interactive, moving all ext to command HEENT: [place]/AT, PERRL, EOM-I and MMM Cardiovascular: RRR, Nl S1/S2 and -M/R/G Lungs: Very coarse BS diffusely, patient unable to cough Abdomen: Soft, NT, ND and +BS Musculoskeletal: -edema and -tenderness Skin: Intact, warm, no rashes, chest wound is clean  I reviewed CXR myself ETT is in a good position  Assessment & Plan:   Acute Pulmonary Emboli (Per VQ scan) w/ large mobile mass in the RA - Seen by vascular surgery, concerned that he may have an atrial mass, and clot propagated from this mass resulting in the pulmonary emboli. Now status post clot removal and PFO closure by TCTS. A.fib with RVR. Liver mass. Hx of EtOH abuse drinks 1/5 a day. Chronic kidney disease with history of polycystic kidney disease. Discussed with PCCM-NP  Plan: Immediate post-operative management by TCTS. Extubate today Will continue anticoagulation given reisk Titrate O2 for sat of 88-92% Flutter valve as ordered Pain control as above Neo for BP support, assume that as sedation decreases patient will be able to come off the neo IS per RT protocol Ambulate when able Titrate neo to off as able PCCM will assume back care when CVTS deems it appropriate  The patient is critically ill with multiple organ systems failure and requires high complexity  decision making for assessment and support, frequent evaluation and titration of therapies, application of advanced monitoring technologies and extensive interpretation of multiple databases.   Critical Care Time devoted to patient care services described in this note is  31  Minutes. This time reflects time of care of this signee Dr Jennet Maduro. This critical care time does not reflect procedure time, or teaching time or supervisory time of PA/NP/Med student/Med Resident etc but could involve care  discussion time.  Rush Farmer, M.D. [facility] Pulmonary/Critical Care Medicine. Pager: [phone]. After hours pager: [phone].

## 2018-09-20 NOTE — Progress Notes (Addendum)
ANTICOAGULATION CONSULT NOTE - Follow Up Consult  Pharmacy Consult for heparin Indication: biatrial thrombus  Labs: Recent Labs    09/18/18 0434 09/19/18 0449  09/19/18 1150  09/19/18 1250 09/19/18 1414 09/19/18 2003 09/19/18 2009 09/20/18 0135  HGB 12.5* 12.1*   < > 9.1*   < > 9.5* 11.6*  12.2* 11.3* 10.9*  --   HCT 37.9* 35.7*   < > 27.0*   < > 28.0* 34.0*  35.7* 33.0* 32.0*  --   PLT 175 168  --  143*  --   --  139* 133*  --   --   APTT  --   --   --   --   --   --  26  --   --   --   LABPROT  --   --   --   --   --   --  15.2  --   --   --   INR  --   --   --   --   --   --  1.22  --   --   --   HEPARINUNFRC 0.44 0.25*  --   --   --   --   --   --   --  <0.10*  CREATININE 1.93* 1.93*   < >  --    < > 1.50*  --  1.66* 1.70*  --    < > = values in this interval not displayed.    Assessment: 47yo male subtherapeutic on heparin after resumed post-op; previously required rates to 1300 units/hr for therapeutic levels but currently on conservative post-op rates; no signs of bleeding noted by RN.  Goal of Therapy:  Heparin level ~0.3 units/ml   Plan:  Will increase heparin gtt by 2-3 units/kg/hr to 1000 units/hr and check level in 6 hours.    Wynona Neat, PharmD, BCPS  09/20/2018,2:45 AM

## 2018-09-21 ENCOUNTER — Encounter (HOSPITAL_COMMUNITY): Payer: Self-pay | Admitting: Interventional Cardiology

## 2018-09-21 ENCOUNTER — Inpatient Hospital Stay (HOSPITAL_COMMUNITY): Payer: Self-pay

## 2018-09-21 DIAGNOSIS — Q211 Atrial septal defect: Secondary | ICD-10-CM

## 2018-09-21 DIAGNOSIS — I4891 Unspecified atrial fibrillation: Secondary | ICD-10-CM

## 2018-09-21 DIAGNOSIS — Z09 Encounter for follow-up examination after completed treatment for conditions other than malignant neoplasm: Secondary | ICD-10-CM

## 2018-09-21 LAB — RENAL FUNCTION PANEL
Albumin: 2.7 g/dL — ABNORMAL LOW (ref 3.5–5.0)
Anion gap: 10 (ref 5–15)
BUN: 20 mg/dL (ref 6–20)
CO2: 23 mmol/L (ref 22–32)
Calcium: 8 mg/dL — ABNORMAL LOW (ref 8.9–10.3)
Chloride: 98 mmol/L (ref 98–111)
Creatinine, Ser: 2.09 mg/dL — ABNORMAL HIGH (ref 0.61–1.24)
GFR calc Af Amer: 43 mL/min — ABNORMAL LOW (ref >60)
GFR calc non Af Amer: 37 mL/min — ABNORMAL LOW (ref >60)
Glucose, Bld: 122 mg/dL — ABNORMAL HIGH (ref 70–99)
Phosphorus: 3.6 mg/dL (ref 2.5–4.6)
Potassium: 6 mmol/L — ABNORMAL HIGH (ref 3.5–5.1)
Sodium: 131 mmol/L — ABNORMAL LOW (ref 135–145)

## 2018-09-21 LAB — CBC
HCT: 29.8 % — ABNORMAL LOW (ref 39.0–52.0)
Hemoglobin: 10.4 g/dL — ABNORMAL LOW (ref 13.0–17.0)
MCH: 35.3 pg — ABNORMAL HIGH (ref 26.0–34.0)
MCHC: 34.9 g/dL (ref 30.0–36.0)
MCV: 101 fL — ABNORMAL HIGH (ref 80.0–100.0)
Platelets: 189 10*3/uL (ref 150–400)
RBC: 2.95 MIL/uL — ABNORMAL LOW (ref 4.22–5.81)
RDW: 12.5 % (ref 11.5–15.5)
WBC: 9.1 10*3/uL (ref 4.0–10.5)
nRBC: 0 % (ref 0.0–0.2)

## 2018-09-21 LAB — HEPARIN LEVEL (UNFRACTIONATED)
Heparin Unfractionated: 0.16 IU/mL — ABNORMAL LOW (ref 0.30–0.70)
Heparin Unfractionated: <0.1 IU/mL — ABNORMAL LOW (ref 0.30–0.70)

## 2018-09-21 LAB — COOXEMETRY PANEL
Carboxyhemoglobin: 0.9 % (ref 0.5–1.5)
Carboxyhemoglobin: 0.9 % (ref 0.5–1.5)
Methemoglobin: 1.8 % — ABNORMAL HIGH (ref 0.0–1.5)
Methemoglobin: 1.6 % — ABNORMAL HIGH (ref 0.0–1.5)
O2 Saturation: 52.5 %
O2 Saturation: 42.3 %
Total hemoglobin: 10.8 g/dL — ABNORMAL LOW (ref 12.0–16.0)
Total hemoglobin: 11.9 g/dL — ABNORMAL LOW (ref 12.0–16.0)

## 2018-09-21 LAB — COMPREHENSIVE METABOLIC PANEL
ALT: 12 U/L (ref 0–44)
AST: 13 U/L — ABNORMAL LOW (ref 15–41)
Albumin: 2.4 g/dL — ABNORMAL LOW (ref 3.5–5.0)
Alkaline Phosphatase: 32 U/L — ABNORMAL LOW (ref 38–126)
Anion gap: 5 (ref 5–15)
BUN: 20 mg/dL (ref 6–20)
CO2: 26 mmol/L (ref 22–32)
Calcium: 7.7 mg/dL — ABNORMAL LOW (ref 8.9–10.3)
Chloride: 105 mmol/L (ref 98–111)
Creatinine, Ser: 2.07 mg/dL — ABNORMAL HIGH (ref 0.61–1.24)
GFR calc Af Amer: 44 mL/min — ABNORMAL LOW (ref >60)
GFR calc non Af Amer: 38 mL/min — ABNORMAL LOW (ref >60)
Glucose, Bld: 137 mg/dL — ABNORMAL HIGH (ref 70–99)
Potassium: 4 mmol/L (ref 3.5–5.1)
Sodium: 136 mmol/L (ref 135–145)
Total Bilirubin: 0.5 mg/dL (ref 0.3–1.2)
Total Protein: 5.2 g/dL — ABNORMAL LOW (ref 6.5–8.1)

## 2018-09-21 LAB — GLUCOSE, CAPILLARY
Glucose-Capillary: 101 mg/dL — ABNORMAL HIGH (ref 70–99)
Glucose-Capillary: 104 mg/dL — ABNORMAL HIGH (ref 70–99)
Glucose-Capillary: 105 mg/dL — ABNORMAL HIGH (ref 70–99)
Glucose-Capillary: 106 mg/dL — ABNORMAL HIGH (ref 70–99)
Glucose-Capillary: 111 mg/dL — ABNORMAL HIGH (ref 70–99)
Glucose-Capillary: 123 mg/dL — ABNORMAL HIGH (ref 70–99)
Glucose-Capillary: 88 mg/dL (ref 70–99)
Glucose-Capillary: 93 mg/dL (ref 70–99)

## 2018-09-21 LAB — PHOSPHORUS: Phosphorus: 4.2 mg/dL (ref 2.5–4.6)

## 2018-09-21 LAB — MAGNESIUM: MAGNESIUM: 2.3 mg/dL (ref 1.7–2.4)

## 2018-09-21 MED ORDER — METOLAZONE 5 MG PO TABS
5.0000 mg | ORAL_TABLET | Freq: Every day | ORAL | Status: DC
  Administered 2018-09-21 – 2018-09-22 (×2): 5 mg via ORAL
  Filled 2018-09-21 (×2): qty 1

## 2018-09-21 MED ORDER — TRAMADOL HCL 50 MG PO TABS: 50.0000 mg | ORAL_TABLET | Freq: Four times a day (QID) | ORAL | Status: DC | PRN

## 2018-09-21 MED ORDER — FUROSEMIDE 10 MG/ML IJ SOLN
40.0000 mg | Freq: Two times a day (BID) | INTRAMUSCULAR | Status: DC
  Administered 2018-09-21 – 2018-09-22 (×4): 40 mg via INTRAVENOUS
  Filled 2018-09-21 (×5): qty 4

## 2018-09-21 MED ORDER — GUAIFENESIN ER 600 MG PO TB12
600.0000 mg | ORAL_TABLET | Freq: Two times a day (BID) | ORAL | Status: DC
  Administered 2018-09-21 – 2018-09-26 (×11): 600 mg via ORAL
  Filled 2018-09-21 (×11): qty 1

## 2018-09-21 MED ORDER — MORPHINE SULFATE (PF) 2 MG/ML IV SOLN
1.0000 mg | INTRAVENOUS | Status: DC | PRN
  Administered 2018-09-21 – 2018-09-22 (×7): 4 mg via INTRAVENOUS
  Filled 2018-09-21 (×7): qty 2

## 2018-09-21 MED ORDER — INSULIN ASPART 100 UNIT/ML ~~LOC~~ SOLN
0.0000 [IU] | Freq: Three times a day (TID) | SUBCUTANEOUS | Status: DC
  Administered 2018-09-22: 2 [IU] via SUBCUTANEOUS

## 2018-09-21 MED ORDER — LEVALBUTEROL HCL 1.25 MG/0.5ML IN NEBU
1.2500 mg | INHALATION_SOLUTION | Freq: Three times a day (TID) | RESPIRATORY_TRACT | Status: DC
  Administered 2018-09-21 – 2018-09-25 (×13): 1.25 mg via RESPIRATORY_TRACT
  Filled 2018-09-21 (×14): qty 0.5

## 2018-09-21 NOTE — Progress Notes (Signed)
      [address]       [place] [address]             [phone]      POD # 2 closure of PFO  BP 135/88   Pulse (!) 105   Temp 98 F (36.7 C) (Oral)   Resp (!) 26   Ht 5\' 9"  (1.753 m)   Wt 81.2 kg   SpO2 95%   BMI 26.44 kg/m   Intake/Output Summary (Last 24 hours) at 09/21/2018 1728 Last data filed at 09/21/2018 1700 Gross per 24 hour  Intake 3284.22 ml  Output 1620 ml  Net 1664.22 ml   Milrinone at 0.25 mcg/kg/min Co-ox down from 52 to 42 this evening with no other changes  Continue milrinone  Remo Lipps C. Roxan Hockey, MD [facility] [phone]

## 2018-09-21 NOTE — Progress Notes (Signed)
[place] for heparin Indication: biatrial thrombus, PE/DVT  Allergies  Allergen Reactions  . Lisinopril Swelling    Lip swelling    Patient Measurements: Height: 5\' 9"  (175.3 cm) Weight: 179 lb 0.2 oz (81.2 kg) IBW/kg (Calculated) : 70.7 Heparin Dosing Weight: 77kg  Vital Signs: Temp: 98 F (36.7 C) (12/02 1200) Temp Source: Oral (12/02 1200) BP: 121/82 (12/02 1100) Pulse Rate: 103 (12/02 1200)  Labs: Recent Labs    09/19/18 1414 09/19/18 2003  09/20/18 0135 09/20/18 0406 09/20/18 1602 09/20/18 1604 09/21/18 0130 09/21/18 0500  HGB 11.6*  12.2* 11.3*   < >  --  11.5*  --  10.5*  --  10.4*  HCT 34.0*  35.7* 33.0*   < >  --  34.1*  --  31.0*  --  29.8*  PLT 139* 133*  --   --  164  --   --   --  189  APTT 26  --   --   --   --   --   --   --   --   LABPROT 15.2  --   --   --   --   --   --   --   --   INR 1.22  --   --   --   --   --   --   --   --   HEPARINUNFRC  --   --   --  <0.10*  --  <0.10*  --  0.16*  --   CREATININE  --  1.66*   < >  --  1.75* 2.06* 2.00*  --  2.07*   < > = values in this interval not displayed.    Estimated Creatinine Clearance: 45.1 mL/min (A) (by C-G formula based on SCr of 2.07 mg/dL (H)).  Assessment: 47 yo m presenting with acute PE diagnosed by VQ, large mobile mass in RA. Not on anticoagulation PTA. Returns from surgery today. Pharmacy consulted to restart heparin.  Heparin level low at 0.16 on 1200 units/hr this am. CBC stable, no overt bleeding noted overnight. Lucianne Lei Trigt increased rate to 1300 on am rounds, we are titrating slowly given recent surgery. Will at this time leave adjustments of heparin rate to surgery until deemed more appropriate for standard heparin.      Goal of Therapy:  Eventual heparin level goal of 0.4-0.5 per PVT 12/2 Monitor platelets by anticoagulation protocol: Yes   Plan:  Heparin increased to 1300 units/hr this am Will add heparin level to 4pm renal panel to  assess Monitor daily heparin level and CBC, s/sx bleeding  Erin Hearing PharmD., BCPS Clinical Pharmacist 09/21/2018 1:56 PM

## 2018-09-21 NOTE — Anesthesia Postprocedure Evaluation (Addendum)
Anesthesia Post Note  Patient: [name]  Procedure(s) Performed: TRANSESOPHAGEAL ECHOCARDIOGRAM (TEE) (N/A )     Patient location during evaluation: PACU Anesthesia Type: MAC Level of consciousness: awake and alert Pain management: pain level controlled Vital Signs Assessment: post-procedure vital signs reviewed and stable Respiratory status: spontaneous breathing, nonlabored ventilation and respiratory function stable Cardiovascular status: stable and blood pressure returned to baseline Anesthetic complications: no    Last Vitals:  Vitals:   09/21/18 0500 09/21/18 0600  BP: 105/78 98/82  Pulse: 98   Resp: (!) 25 (!) 29  Temp:    SpO2: 98%                    Audry Pili

## 2018-09-21 NOTE — Progress Notes (Signed)
2 Days Post-Op Procedure(s) (LRB): CLOSURE OF PATENT FORAMEN OVALE (N/A) INTRAOPERATIVE TRANSESOPHAGEAL ECHOCARDIOGRAM (N/A) CLIPPING OF LEFT ATRIAL APPENDAGE Subjective: bronchopneumonia improving RV dysfunction stable on mil .25, nsr DVT in left LE with poss arterial tibial embolus- good L fem pulse Cont iv heparin - increase dose slowly for level 0.4- 0.5 postop cardiotomy Objective: Vital signs in last 24 hours: Temp:  [97.9 F (36.6 C)-101.4 F (38.6 C)] 98.5 F (36.9 C) (12/02 0400) Pulse Rate:  [86-128] 98 (12/02 0500) Cardiac Rhythm: Sinus tachycardia (12/01 2000) Resp:  [21-37] 24 (12/02 0700) BP: (74-131)/(43-88) 98/82 (12/02 0600) SpO2:  [84 %-100 %] 96 % (12/02 0748) Arterial Line BP: (74-136)/(41-80) 126/65 (12/02 0600) FiO2 (%):  [40 %] 40 % (12/01 0800) Weight:  [81.2 kg] 81.2 kg (12/02 0500)  Hemodynamic parameters for last 24 hours: CVP:  [4 mmHg-12 mmHg] 12 mmHg  Intake/Output from previous day: 12/01 0701 - 12/02 0700 In: 2772.3 [P.O.:1440; I.V.:1159.1; IV Piggyback:173.1] Out: 3545 [Urine:1090; Emesis/NG output:50; Chest Tube:430] Intake/Output this shift: No intake/output data recorded.  Exam Coarse breath sounds Both feet warm Neuro intact Lab Results: Recent Labs    09/20/18 0406 09/20/18 1604 09/21/18 0500  WBC 5.4  --  9.1  HGB 11.5* 10.5* 10.4*  HCT 34.1* 31.0* 29.8*  PLT 164  --  189   BMET:  Recent Labs    09/20/18 0406  09/20/18 1604 09/21/18 0500  NA 137  --  140 136  K 4.6  --  4.6 4.0  CL 108  --  110 105  CO2 20*  --   --  26  GLUCOSE 117*  --  110* 137*  BUN 15  --  17 20  CREATININE 1.75*   < > 2.00* 2.07*  CALCIUM 7.9*  --   --  7.7*   < > = values in this interval not displayed.    PT/INR:  Recent Labs    09/19/18 1414  LABPROT 15.2  INR 1.22   ABG    Component Value Date/Time   PHART 7.403 09/20/2018 1600   HCO3 19.4 (L) 09/20/2018 1600   TCO2 21 (L) 09/20/2018 1604   ACIDBASEDEF 4.5 (H) 09/20/2018  1600   O2SAT 52.5 09/21/2018 0509   CBG (last 3)  Recent Labs    09/20/18 2030 09/21/18 0113 09/21/18 0423  GLUCAP 127* 106* 123*    Assessment/Plan: S/P Procedure(s) (LRB): CLOSURE OF PATENT FORAMEN OVALE (N/A) INTRAOPERATIVE TRANSESOPHAGEAL ECHOCARDIOGRAM (N/A) CLIPPING OF LEFT ATRIAL APPENDAGE Pul toilette with severe COPD, sternotomy Slowly increase heparin and remove chest tube Advance diet  LOS: 5 days    [name] 09/21/2018

## 2018-09-21 NOTE — Progress Notes (Signed)
  Progress Note    09/21/2018 9:49 AM 2 Days Post-Op  Subjective: Mostly complains of chest pain, left foot and left hand feel cooler than right  Vitals:   09/21/18 0748 09/21/18 0800  BP:  113/76  Pulse:    Resp:  (!) 22  Temp:  98 F (36.7 C)  SpO2: 96%     Physical Exam: Awake alert and oriented Sternal dressing in place Bilateral common femoral pulses are palpable All extremities are sensorimotor intact Strong right dorsalis pedis pulse and weak signal on the left at the [facility] and PT which is monophasic Palpable left radial pulse  CBC    Component Value Date/Time   WBC 9.1 09/21/2018 0500   RBC 2.95 (L) 09/21/2018 0500   HGB 10.4 (L) 09/21/2018 0500   HCT 29.8 (L) 09/21/2018 0500   PLT 189 09/21/2018 0500   MCV 101.0 (H) 09/21/2018 0500   MCH 35.3 (H) 09/21/2018 0500   MCHC 34.9 09/21/2018 0500   RDW 12.5 09/21/2018 0500   LYMPHSABS 1.4 09/06/2017 1225   MONOABS 0.4 09/06/2017 1225   EOSABS 1.0 (H) 09/06/2017 1225   BASOSABS 0.0 09/06/2017 1225    BMET    Component Value Date/Time   NA 136 09/21/2018 0500   NA 143 04/22/2018 1026   K 4.0 09/21/2018 0500   CL 105 09/21/2018 0500   CO2 26 09/21/2018 0500   GLUCOSE 137 (H) 09/21/2018 0500   BUN 20 09/21/2018 0500   BUN 19 04/22/2018 1026   CREATININE 2.07 (H) 09/21/2018 0500   CALCIUM 7.7 (L) 09/21/2018 0500   GFRNONAA 38 (L) 09/21/2018 0500   GFRAA 44 (L) 09/21/2018 0500    INR    Component Value Date/Time   INR 1.22 09/19/2018 1414     Intake/Output Summary (Last 24 hours) at 09/21/2018 0949 Last data filed at 09/21/2018 0700 Gross per 24 hour  Intake 2637.22 ml  Output 1420 ml  Net 1217.22 ml    DVT study Right: There is no evidence of deep vein thrombosis in the lower extremity. Left: Findings consistent with acute deep vein thrombosis involving the left popliteal vein. Patient stated his left leg felt cold. Incidentally, the waveforms in the left mid to distal femoral, popliteal, and  posterior tibial arteries are significant  dampened as compared to the right lower extremity.  Assessment:  47 y.o. male is sternotomy repair of PFO with possible paradoxical embolus to left lower extremity but left leg currently is sensorimotor intact despite having monophasic signals at the ankle relative to the opposite side which appears normal  Plan: We will plan angiogram prior to discharge.  Patient is a symptom medic at this time if this changes we may need angiogram more urgently.  I suspect that upon discharge he would have significant issues walking if this indeed is an acute process.  Continue heparin drip for now.  Hopefully renal function will improve prior to needing angiography.   Brandon C. Donzetta Matters, MD [facility] Office: [phone] Pager: [phone]  09/21/2018 9:49 AM

## 2018-09-22 ENCOUNTER — Inpatient Hospital Stay (HOSPITAL_COMMUNITY): Payer: Self-pay

## 2018-09-22 DIAGNOSIS — I82402 Acute embolism and thrombosis of unspecified deep veins of left lower extremity: Secondary | ICD-10-CM

## 2018-09-22 LAB — TYPE AND SCREEN
ABO/RH(D): AB POS
Antibody Screen: NEGATIVE
Unit division: 0
Unit division: 0

## 2018-09-22 LAB — SEROTONIN RELEASE ASSAY (SRA)
SRA .2 IU/mL UFH Ser-aCnc: <1 % (ref 0–20)
SRA 100IU/mL UFH Ser-aCnc: <1 % (ref 0–20)

## 2018-09-22 LAB — CBC
HCT: 29.9 % — ABNORMAL LOW (ref 39.0–52.0)
Hemoglobin: 10.3 g/dL — ABNORMAL LOW (ref 13.0–17.0)
MCH: 34.9 pg — ABNORMAL HIGH (ref 26.0–34.0)
MCHC: 34.4 g/dL (ref 30.0–36.0)
MCV: 101.4 fL — ABNORMAL HIGH (ref 80.0–100.0)
Platelets: 217 10*3/uL (ref 150–400)
RBC: 2.95 MIL/uL — ABNORMAL LOW (ref 4.22–5.81)
RDW: 12.4 % (ref 11.5–15.5)
WBC: 9.6 10*3/uL (ref 4.0–10.5)
nRBC: 0 % (ref 0.0–0.2)

## 2018-09-22 LAB — COOXEMETRY PANEL
Carboxyhemoglobin: 1.2 % (ref 0.5–1.5)
Carboxyhemoglobin: 0.9 % (ref 0.5–1.5)
Carboxyhemoglobin: 1.1 % (ref 0.5–1.5)
Methemoglobin: 1.1 % (ref 0.0–1.5)
Methemoglobin: 1 % (ref 0.0–1.5)
Methemoglobin: 1.5 % (ref 0.0–1.5)
O2 Saturation: 61.9 %
O2 Saturation: 36.4 %
O2 Saturation: 50 %
Total hemoglobin: 10.8 g/dL — ABNORMAL LOW (ref 12.0–16.0)
Total hemoglobin: 13.1 g/dL (ref 12.0–16.0)
Total hemoglobin: 11.1 g/dL — ABNORMAL LOW (ref 12.0–16.0)

## 2018-09-22 LAB — COMPREHENSIVE METABOLIC PANEL
ALT: 12 U/L (ref 0–44)
AST: 15 U/L (ref 15–41)
Albumin: 2.5 g/dL — ABNORMAL LOW (ref 3.5–5.0)
Alkaline Phosphatase: 38 U/L (ref 38–126)
Anion gap: 10 (ref 5–15)
BUN: 23 mg/dL — ABNORMAL HIGH (ref 6–20)
CO2: 24 mmol/L (ref 22–32)
Calcium: 8 mg/dL — ABNORMAL LOW (ref 8.9–10.3)
Chloride: 98 mmol/L (ref 98–111)
Creatinine, Ser: 2.11 mg/dL — ABNORMAL HIGH (ref 0.61–1.24)
GFR calc Af Amer: 43 mL/min — ABNORMAL LOW (ref >60)
GFR calc non Af Amer: 37 mL/min — ABNORMAL LOW (ref >60)
Glucose, Bld: 126 mg/dL — ABNORMAL HIGH (ref 70–99)
Potassium: 4.3 mmol/L (ref 3.5–5.1)
Sodium: 132 mmol/L — ABNORMAL LOW (ref 135–145)
Total Bilirubin: 0.7 mg/dL (ref 0.3–1.2)
Total Protein: 5.7 g/dL — ABNORMAL LOW (ref 6.5–8.1)

## 2018-09-22 LAB — GLUCOSE, CAPILLARY
Glucose-Capillary: 116 mg/dL — ABNORMAL HIGH (ref 70–99)
Glucose-Capillary: 105 mg/dL — ABNORMAL HIGH (ref 70–99)
Glucose-Capillary: 103 mg/dL — ABNORMAL HIGH (ref 70–99)
Glucose-Capillary: 94 mg/dL (ref 70–99)
Glucose-Capillary: 122 mg/dL — ABNORMAL HIGH (ref 70–99)

## 2018-09-22 LAB — HEPARIN LEVEL (UNFRACTIONATED)
Heparin Unfractionated: 0.25 IU/mL — ABNORMAL LOW (ref 0.30–0.70)
Heparin Unfractionated: 0.21 IU/mL — ABNORMAL LOW (ref 0.30–0.70)

## 2018-09-22 LAB — BPAM RBC
Blood Product Expiration Date: 201912232359
Blood Product Expiration Date: 201912252359
ISSUE DATE / TIME: 201911301045
ISSUE DATE / TIME: 201911301045
Unit Type and Rh: 8400
Unit Type and Rh: 8400

## 2018-09-22 MED ORDER — CARVEDILOL 3.125 MG PO TABS
3.1250 mg | ORAL_TABLET | Freq: Two times a day (BID) | ORAL | Status: DC
  Administered 2018-09-22 (×2): 3.125 mg via ORAL
  Filled 2018-09-22 (×3): qty 1

## 2018-09-22 MED FILL — Heparin Sodium (Porcine) Inj 1000 Unit/ML: INTRAMUSCULAR | Qty: 30 | Status: AC

## 2018-09-22 MED FILL — Calcium Chloride Inj 10%: INTRAVENOUS | Qty: 10 | Status: AC

## 2018-09-22 MED FILL — Mannitol IV Soln 20%: INTRAVENOUS | Qty: 500 | Status: AC

## 2018-09-22 MED FILL — Heparin Sodium (Porcine) Inj 1000 Unit/ML: INTRAMUSCULAR | Qty: 10 | Status: AC

## 2018-09-22 MED FILL — Sodium Bicarbonate IV Soln 8.4%: INTRAVENOUS | Qty: 50 | Status: AC

## 2018-09-22 MED FILL — Potassium Chloride Inj 2 mEq/ML: INTRAVENOUS | Qty: 40 | Status: AC

## 2018-09-22 MED FILL — Magnesium Sulfate Inj 50%: INTRAMUSCULAR | Qty: 10 | Status: AC

## 2018-09-22 MED FILL — Electrolyte-R (PH 7.4) Solution: INTRAVENOUS | Qty: 4000 | Status: AC

## 2018-09-22 NOTE — Progress Notes (Signed)
[place] for heparin Indication: biatrial thrombus, PE/DVT  Allergies  Allergen Reactions  . Lisinopril Swelling    Lip swelling    Patient Measurements: Height: 5\' 9"  (175.3 cm) Weight: 175 lb 14.8 oz (79.8 kg) IBW/kg (Calculated) : 70.7 Heparin Dosing Weight: 77kg  Vital Signs: Temp: 98.3 F (36.8 C) (12/03 0743) Temp Source: Oral (12/03 0743) BP: 126/98 (12/03 0900) Pulse Rate: 107 (12/03 0400)  Labs: Recent Labs    09/19/18 1414  09/20/18 0406  09/20/18 1604 09/21/18 0130 09/21/18 0500 09/21/18 1600 09/22/18 0227  HGB 11.6*  12.2*   < > 11.5*  --  10.5*  --  10.4*  --  10.3*  HCT 34.0*  35.7*   < > 34.1*  --  31.0*  --  29.8*  --  29.9*  PLT 139*   < > 164  --   --   --  189  --  217  APTT 26  --   --   --   --   --   --   --   --   LABPROT 15.2  --   --   --   --   --   --   --   --   INR 1.22  --   --   --   --   --   --   --   --   HEPARINUNFRC  --    < >  --    < >  --  0.16*  --  <0.10* 0.25*  CREATININE  --    < > 1.75*   < > 2.00*  --  2.07* 2.09* 2.11*   < > = values in this interval not displayed.    Estimated Creatinine Clearance: 44.2 mL/min (A) (by C-G formula based on SCr of 2.11 mg/dL (H)).  Assessment: 47 yo m presenting with acute PE diagnosed by VQ, large mobile mass in RA. Not on anticoagulation PTA. Returns from surgery today. Pharmacy consulted to restart heparin.  Heparin level low at 0.25 on 1400 units/hr this am. CBC stable, no overt bleeding noted overnight. Lucianne Lei Trigt increased rate to 1500 on am rounds, we are titrating slowly given recent surgery. Will at this time leave adjustments of heparin rate to surgery until deemed more appropriate for standard heparin.   Goal of Therapy:  Eventual heparin level goal of 0.4-0.5 per PVT  Monitor platelets by anticoagulation protocol: Yes   Plan:  Heparin increased to 1500 units/hr this am Will add heparin level to 4pm renal panel to assess Monitor  daily heparin level and CBC, s/sx bleeding  Erin Hearing PharmD., BCPS Clinical Pharmacist 09/22/2018 10:23 AM

## 2018-09-22 NOTE — Progress Notes (Signed)
*  PRELIMINARY RESULTS* Vascular Ultrasound Left Lower Extremity Arterial Duplex has been completed.  Left common femoral artery has evidence of low level echoes, suggestive of possible thrombus.  There is a collateral coming off of the left proximal superficial femoral artery, occlusion of mid femoral artery, and reconstitution of the distal femoral artery via collateral flow.  Thrombus is still noted in the left popliteal vein.  Incidental finding: there is evidence of a heterogenous area of the left groin, suggestive of a prominent lymph node.  09/22/2018 2:32 PM Maudry Mayhew, MHA, RVT, RDCS, RDMS

## 2018-09-22 NOTE — Progress Notes (Signed)
3 Days Post-Op Procedure(s) (LRB): CLOSURE OF PATENT FORAMEN OVALE (N/A) INTRAOPERATIVE TRANSESOPHAGEAL ECHOCARDIOGRAM (N/A) CLIPPING OF LEFT ATRIAL APPENDAGE Subjective: Walking in hall 2L O2 nasal cannula Sinus tach Weaning milrinone for post PE  RV failure Heparin up to 1500 u - will transition to Xarelto when close   to DC home Objective: Vital signs in last 24 hours: Temp:  [98 F (36.7 C)-98.7 F (37.1 C)] 98.3 F (36.8 C) (12/03 0743) Pulse Rate:  [96-117] 107 (12/03 0400) Cardiac Rhythm: Sinus tachycardia (12/03 0800) Resp:  [12-32] 22 (12/03 0900) BP: (101-155)/(80-106) 126/98 (12/03 0900) SpO2:  [88 %-96 %] 92 % (12/03 0743) Weight:  [79.8 kg] 79.8 kg (12/03 0500)  Hemodynamic parameters for last 24 hours: CVP:  [15 mmHg-18 mmHg] 15 mmHg  Intake/Output from previous day: 12/02 0701 - 12/03 0700 In: 1502.8 [P.O.:960; I.V.:442.8; IV Piggyback:100] Out: 1980 [YWVPX:1062; Chest Tube:70] Intake/Output this shift: Total I/O In: 39.4 [I.V.:39.4] Out: -   Coarse breath sounds Sternal incision clean, dry Mild edema Lab Results: Recent Labs    09/21/18 0500 09/22/18 0227  WBC 9.1 9.6  HGB 10.4* 10.3*  HCT 29.8* 29.9*  PLT 189 217   BMET:  Recent Labs    09/21/18 1600 09/22/18 0227  NA 131* 132*  K 6.0* 4.3  CL 98 98  CO2 23 24  GLUCOSE 122* 126*  BUN 20 23*  CREATININE 2.09* 2.11*  CALCIUM 8.0* 8.0*    PT/INR:  Recent Labs    09/19/18 1414  LABPROT 15.2  INR 1.22   ABG    Component Value Date/Time   PHART 7.403 09/20/2018 1600   HCO3 19.4 (L) 09/20/2018 1600   TCO2 21 (L) 09/20/2018 1604   ACIDBASEDEF 4.5 (H) 09/20/2018 1600   O2SAT 61.9 09/22/2018 0230   CBG (last 3)  Recent Labs    09/21/18 2350 09/22/18 0346 09/22/18 0818  GLUCAP 101* 116* 105*    Assessment/Plan: S/P Procedure(s) (LRB): CLOSURE OF PATENT FORAMEN OVALE (N/A) INTRAOPERATIVE TRANSESOPHAGEAL ECHOCARDIOGRAM (N/A) CLIPPING OF LEFT ATRIAL  APPENDAGE Mobilize Diuresis wean milrinone and follow co-ox   LOS: 6 days    [name] 09/22/2018

## 2018-09-22 NOTE — Progress Notes (Signed)
  Progress Note    09/22/2018 9:08 AM 3 Days Post-Op  Subjective: Denies any foot pain or hand pain.  Chief complaint is chest pain  Vitals:   09/22/18 0823 09/22/18 0900  BP: 117/89 (!) 126/98  Pulse:    Resp: 17 (!) 22  Temp:    SpO2:      Physical Exam: Awake alert oriented Nonlabored respirations Sternotomy dressing in place Bilateral common femoral pulses are palpable Both feet are warm and sensorimotor intact He has monophasic signals in his left foot relative to multiphasic in the right   CBC    Component Value Date/Time   WBC 9.6 09/22/2018 0227   RBC 2.95 (L) 09/22/2018 0227   HGB 10.3 (L) 09/22/2018 0227   HCT 29.9 (L) 09/22/2018 0227   PLT 217 09/22/2018 0227   MCV 101.4 (H) 09/22/2018 0227   MCH 34.9 (H) 09/22/2018 0227   MCHC 34.4 09/22/2018 0227   RDW 12.4 09/22/2018 0227   LYMPHSABS 1.4 09/06/2017 1225   MONOABS 0.4 09/06/2017 1225   EOSABS 1.0 (H) 09/06/2017 1225   BASOSABS 0.0 09/06/2017 1225    BMET    Component Value Date/Time   NA 132 (L) 09/22/2018 0227   NA 143 04/22/2018 1026   K 4.3 09/22/2018 0227   CL 98 09/22/2018 0227   CO2 24 09/22/2018 0227   GLUCOSE 126 (H) 09/22/2018 0227   BUN 23 (H) 09/22/2018 0227   BUN 19 04/22/2018 1026   CREATININE 2.11 (H) 09/22/2018 0227   CALCIUM 8.0 (L) 09/22/2018 0227   GFRNONAA 37 (L) 09/22/2018 0227   GFRAA 43 (L) 09/22/2018 0227    INR    Component Value Date/Time   INR 1.22 09/19/2018 1414     Intake/Output Summary (Last 24 hours) at 09/22/2018 0908 Last data filed at 09/22/2018 0800 Gross per 24 hour  Intake 1006.19 ml  Output 1840 ml  Net -833.81 ml     Assessment/plan:  47 y.o. male is s/p sternotomy and PFO closure.  Concern for acute on chronic ischemia left lower extremity but foot remains sensorimotor intact.  Given elevated creatinine and recent surgery as long as he is not having complaints of his left lower extremity we can likely delay any intervention until he is an  outpatient.  We will continue to follow.    Cung Masterson C. Donzetta Matters, MD [facility] Office: [phone] Pager: [phone]  09/22/2018 9:08 AM

## 2018-09-22 NOTE — Progress Notes (Signed)
Patient ID: [name], male   DOB: Oct 31, 1970, 47 y.o.   MRN: [id number] [facility] Evening Rounds:  Hemodynamically stable  Milrinone decreased to 0.125 today. Co-ox 62 this am and dropped to 36.4, repeat 50.0.  CVP 13 sats 95% on 4L.  Good urine output Ambulated well

## 2018-09-22 NOTE — Discharge Summary (Addendum)
Physician Discharge Summary       [name]       [name] [address]             [phone]    Patient ID: [name] MRN: [id number] DOB/AGE: 03-08-1971 47 y.o.  Admit date: 09/16/2018 Discharge date: 09/26/2018  Admission Diagnoses: 1. Syncope 2. Biatrial thrombus 3. PFO (patent foramen ovale) 4. Atrial fibrillation with rapid ventricular response ([place])  Discharge Diagnoses:  1. S/p Sternotomy, cannulation for cardiopulmonary bypass, right atriotomy for closure of patent foramen ovale, left atrial clip 40 mm device. 2.  ABL anemia 3. DVT LLE 5. History of hypertension 6. History of polycystic kidney disease 7. History of asthma 8. History of hypertension 9. History of Erythroderma 10. History of Eczema herpeticum   Consults: vascular surgery, cardiology, and critical care medicine  Procedure (s):  1.  Sternotomy, cannulation for cardiopulmonary bypass, right atriotomy for closure of patent foramen ovale, left atrial clip 40 mm device by Dr. Prescott Gum on 09/19/2018.  History of Presenting Illness: [name] is a 47 yo AA male with known history of Hypertension, tobacco abuse 1 ppd, asthma and CKD.  He was at the pharmacy picking up his medication and suffered a syncopal episode.  EMS was called and on arrival the patient was found to be hypotensive.  The patient stood up and suffered an additional syncopal episode.  EKG showed the patient to be in Atrial Fibrillation/SVT.  In the ED he underwent Cardioversion x 2.  He was treated with a liter of saline with minimal improvement of his BP.  The patient was placed on IV Cardizem and heparin prophylactically.  CT scan was obtained and showed no evidence of pulmonary embolism.  There was a left hepatic mass measuring 3.4 cm.  Echocardiogram was obtained and showed a right atrial clot.  Vascular surgery was consulted for possible angiovac removal.  The patient was evaluated by Dr. Donzetta Matters who felt patient  would require a TEE.  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  Approximately 2/3 of the thrombus is in the left atrium and prolapses across the mitral valve into the left ventricle.  Cardiology is requesting an emergent cardiothoracic consult.  Currently the patient is asymptomatic.  He is unpleasant overall and denies chest pain and shortness of breath.  He admits to smoking 1 ppd cigarettes he denies drug use.  He asked why I am speaking to him if this isn't being done right now. Dr. Prescott Gum examined the patient and reviewed the images of his coronary angiogram and both echocardiogram studies as well as the images of his chest and abdomen CT scan.  Dr. Prescott Gum discussed his condition with the patient and family including the reasons for sternotomy with cardiopulmonary bypass and left and right atriotomy for removal of thrombus.  Dr. Prescott Gum discussed the patient's diagnosis and condition with Dr. Irish Lack for coordination of care.  The patient understands procedure will involve sternotomy, cardiopulmonary bypass, cardiac incisions, and postoperative ICU stay with multiple tubes and lines.  He understands the potential risks, benefits, and possible complications. Patient underwent a sternotomy, cannulation for cardiopulmonary bypass, right atriotomy for closure of patent foramen ovale, left atrial clip 40 mm device on 09/19/2018.  Brief Hospital Course:  Patient remained intubated secondary to severe COPD and recent pulmonary emboli.  He was extubated on 09/20/2018. He was restarted on a heparin drip. He was weaned off of Milrinone (  for RV dysfunction) and Neo Synephrine drips. He remained afebrile and hemodynamically stable. Gordy Councilman, a line, chest tubes, and foley were removed early in the post operative course.  Patient had CKD. His last creatinine was 2..10, which was his creatinine upon admission. He was put on a Heparin drip for DVT left popliteal vein. Coreg  was started and titrated accordingly. He was volume over loaded and diuresed.  He had ABL anemia.   He did not require a post op transfusion. Last H and H was 10.3 and 30.8.   The patient was felt surgically stable for transfer from the ICU to [facility] for further convalescence on 09/23/2018. He continues to progress with cardiac rehab. He was ambulating on 1 liter of oxygen via [place] as of 09/25/2018. He has been tolerating a diet and has had a bowel movement. Heparin drip was stopped early morning of 12/06. Epicardial pacing wires were removed around noon on 09/25/2018. Per Dr. Prescott Gum, Rivaroxaban 15 mg bid was started later on 09/25/2018. Per pharmacy, patient will take this dose until 10/15/2018;at which time on 10/16/2018, he will take 20 mg at daily. Per Dr. Prescott Gum, will continue oral Lasix 40 mg daily (no potassium supplement as creatinine 2.10) for 5 more days. Chest tube sutures and central line were removed the day of discharge. The patient is felt surgically stable for discharge today.   Latest Vital Signs: Blood pressure 116/81, pulse 85, temperature (!) 97.5 F (36.4 C), temperature source Oral, resp. rate 15, height 5\' 9"  (1.753 m), weight 71.1 kg, SpO2 93 %.  Physical Exam: Cardiovascular: RRR Pulmonary: Slightly diminished at bases Abdomen: Soft, non tender, bowel sounds present. Extremities: No lower extremity edema. Wounds: Clean and dry.  No erythema or signs of infection.  Discharge Condition: Stable and discharged to home.  Recent laboratory studies:  Lab Results  Component Value Date   WBC 6.6 09/25/2018   HGB 10.3 (L) 09/25/2018   HCT 30.8 (L) 09/25/2018   MCV 101.3 (H) 09/25/2018   PLT 353 09/25/2018   Lab Results  Component Value Date   NA 135 09/26/2018   K 4.2 09/26/2018   CL 97 (L) 09/26/2018   CO2 26 09/26/2018   CREATININE 2.10 (H) 09/26/2018   GLUCOSE 124 (H) 09/26/2018    Diagnostic Studies: Ct Abdomen Pelvis Wo Contrast  Result Date:  09/17/2018 CLINICAL DATA:  Right atrial mass.  Evaluate for other pathology. EXAM: CT CHEST, ABDOMEN AND PELVIS WITHOUT CONTRAST TECHNIQUE: Multidetector CT imaging of the chest, abdomen and pelvis was performed following the standard protocol without IV contrast. COMPARISON:  None. FINDINGS: CT CHEST FINDINGS Cardiovascular: The heart size is borderline. The reported atrial mass cannot be assessed on this study without contrast. Evaluation of the thoracic aorta is also limited without contrast but no aneurysm or atherosclerotic change is noted. The main pulmonary arteries are unremarkable. Mediastinum/Nodes: The thyroid and esophagus are unremarkable. No adenopathy. Tiny pleural effusions. No pericardial effusion. No other acute abnormalities in the mediastinum. Lungs/Pleura: Paraseptal emphysematous changes and scarring in the right apex. Foci of air to the right of the trachea and esophagus likely represent diverticula of no acute significance. Central airways are otherwise normal. No pneumothorax. Small bilateral pleural effusions. No suspicious nodules or masses. Subtle tiny ground-glass nodules, particularly in the upper lobes may be centrilobular suggesting the possibility of subtle respiratory bronchiolitis interstitial lung disease given the patient's smoking history. Musculoskeletal: See below. CT ABDOMEN PELVIS FINDINGS Hepatobiliary: Multiple hepatic cysts are noted. There  is a 3.4 cm mass in the left hepatic lobe on axial image 52 which does not appear to be cystic. The gallbladder is unremarkable on limited images. Pancreas: Unremarkable. No pancreatic ductal dilatation or surrounding inflammatory changes. Spleen: Normal in size without focal abnormality. Adrenals/Urinary Tract: Adrenal glands are normal. Innumerable simple and hyperdense cysts throughout both kidneys. Probable parapelvic cysts on the right. A fluid-filled structure on the left as seen on coronal image 73 is likely a dilated left  renal pelvis. I also suspect mild hydronephrosis. The ureters are normal in caliber. The bladder is normal. Stomach/Bowel: The stomach and small bowel are normal. The colon is unremarkable. The appendix is normal. Vascular/Lymphatic: No significant vascular findings are present. No enlarged abdominal or pelvic lymph nodes. Reproductive: Prostate is unremarkable. Other: There is a small amount of free fluid in the pelvis. Musculoskeletal: No acute or significant osseous findings. IMPRESSION: 1. There is a left hepatic mass measuring 3.4 cm which is indeterminate. Recommend an MRI for further evaluation. 2. Apparent dilated left extrarenal pelvis without ureteral dilatation. Suspected mild hydronephrosis. The findings raise the possibility of a UPJ obstruction. An ultrasound could better evaluate. No stones identified. 3. Subtle respiratory bronchiolitis interstitial lung disease not excluded with tiny subtle ground-glass nodules, particularly in the upper lobes. 4. Polycystic renal disease.  Liver cysts are noted as well. 5. Tiny pleural effusions of uncertain etiology. 6. There is a small amount of fluid in the pelvis which is of uncertain etiology as well. Electronically Signed   By: Dorise Bullion III M.D   On: 09/17/2018 16:14   Dg Chest 2 View  Result Date: 09/24/2018 CLINICAL DATA:  Shortness of breath. EXAM: CHEST - 2 VIEW COMPARISON:  09/23/2018 FINDINGS: Central line is unchanged. Cardiomegaly as seen yesterday. Persistent bilateral effusions, larger on the left than the right, with atelectasis at the lung bases. No qualitatively new finding. Upper lungs remain clear. IMPRESSION: Persistent effusions with lower lobe atelectasis, left worse than right. Electronically Signed   By: Nelson Chimes M.D.   On: 09/24/2018 09:51   Dg Chest 2 View  Result Date: 09/16/2018 CLINICAL DATA:  Recurrent syncope today. Tachycardia. History of hypertension. EXAM: CHEST - 2 VIEW COMPARISON:  Chest radiograph January 14, 2011 FINDINGS: Cardiac silhouette is upper limits of normal size, mediastinal silhouette is unremarkable. No pleural effusion or focal consolidation. No pneumothorax. Soft tissue planes and included osseous structures are non suspicious. IMPRESSION: Borderline cardiomegaly.  No acute pulmonary process. Electronically Signed   By: Elon Alas M.D.   On: 09/16/2018 14:29   Ct Chest Wo Contrast  Result Date: 09/17/2018 CLINICAL DATA:  Right atrial mass.  Evaluate for other pathology. EXAM: CT CHEST, ABDOMEN AND PELVIS WITHOUT CONTRAST TECHNIQUE: Multidetector CT imaging of the chest, abdomen and pelvis was performed following the standard protocol without IV contrast. COMPARISON:  None. FINDINGS: CT CHEST FINDINGS Cardiovascular: The heart size is borderline. The reported atrial mass cannot be assessed on this study without contrast. Evaluation of the thoracic aorta is also limited without contrast but no aneurysm or atherosclerotic change is noted. The main pulmonary arteries are unremarkable. Mediastinum/Nodes: The thyroid and esophagus are unremarkable. No adenopathy. Tiny pleural effusions. No pericardial effusion. No other acute abnormalities in the mediastinum. Lungs/Pleura: Paraseptal emphysematous changes and scarring in the right apex. Foci of air to the right of the trachea and esophagus likely represent diverticula of no acute significance. Central airways are otherwise normal. No pneumothorax. Small bilateral pleural effusions. No suspicious  nodules or masses. Subtle tiny ground-glass nodules, particularly in the upper lobes may be centrilobular suggesting the possibility of subtle respiratory bronchiolitis interstitial lung disease given the patient's smoking history. Musculoskeletal: See below. CT ABDOMEN PELVIS FINDINGS Hepatobiliary: Multiple hepatic cysts are noted. There is a 3.4 cm mass in the left hepatic lobe on axial image 52 which does not appear to be cystic. The gallbladder is  unremarkable on limited images. Pancreas: Unremarkable. No pancreatic ductal dilatation or surrounding inflammatory changes. Spleen: Normal in size without focal abnormality. Adrenals/Urinary Tract: Adrenal glands are normal. Innumerable simple and hyperdense cysts throughout both kidneys. Probable parapelvic cysts on the right. A fluid-filled structure on the left as seen on coronal image 73 is likely a dilated left renal pelvis. I also suspect mild hydronephrosis. The ureters are normal in caliber. The bladder is normal. Stomach/Bowel: The stomach and small bowel are normal. The colon is unremarkable. The appendix is normal. Vascular/Lymphatic: No significant vascular findings are present. No enlarged abdominal or pelvic lymph nodes. Reproductive: Prostate is unremarkable. Other: There is a small amount of free fluid in the pelvis. Musculoskeletal: No acute or significant osseous findings. IMPRESSION: 1. There is a left hepatic mass measuring 3.4 cm which is indeterminate. Recommend an MRI for further evaluation. 2. Apparent dilated left extrarenal pelvis without ureteral dilatation. Suspected mild hydronephrosis. The findings raise the possibility of a UPJ obstruction. An ultrasound could better evaluate. No stones identified. 3. Subtle respiratory bronchiolitis interstitial lung disease not excluded with tiny subtle ground-glass nodules, particularly in the upper lobes. 4. Polycystic renal disease.  Liver cysts are noted as well. 5. Tiny pleural effusions of uncertain etiology. 6. There is a small amount of fluid in the pelvis which is of uncertain etiology as well. Electronically Signed   By: Dorise Bullion III M.D   On: 09/17/2018 16:14   Nm Pulmonary Perf And Vent  Result Date: 09/16/2018 CLINICAL DATA:  47 y/o  M; PE suspected, high pretest probability. EXAM: NUCLEAR MEDICINE VENTILATION - PERFUSION LUNG SCAN TECHNIQUE: Ventilation images were obtained in multiple projections using inhaled aerosol  Tc-30m DTPA. Perfusion images were obtained in multiple projections after intravenous injection of Tc-90m MAA. RADIOPHARMACEUTICALS:  31.0 mCi of Tc-66m DTPA aerosol inhalation and 4.2 mCi Tc22m MAA IV COMPARISON:  09/16/2018 chest radiograph. FINDINGS: Ventilation: No focal ventilation defect. Perfusion: Large right upper lung perfusion defect. Moderate left and small right lower lobe perfusion defects. Small left upper lobe perfusion defect. Perfusion defects are mismatch. There is mild heterogeneity of ventilation. No radiographic correlate for perfusion defects. IMPRESSION: Multiple mismatched perfusion defects including a large defect in the right upper lung. High probability of pulmonary embolus. These results will be called to the ordering clinician or representative by the Radiologist Assistant, and communication documented in the PACS or zVision Dashboard. Electronically Signed   By: Kristine Garbe M.D.   On: 09/16/2018 22:18   Dg Chest Port 1 View  Result Date: 09/23/2018 CLINICAL DATA:  Postop day 4 repair of patent foramen ovale and LEFT atrial appendage clipping. Residual chest pain and shortness of breath. Follow-up effusions and atelectasis. EXAM: PORTABLE CHEST 1 VIEW COMPARISON:  09/22/2018, 09/21/2018 and earlier, including CT chest 09/17/2018. FINDINGS: Sternotomy for LEFT atrial appendage clipping. RIGHT subclavian central venous catheter tip projects over the mid to LOWER SVC, unchanged. Cardiac silhouette mildly enlarged, unchanged. Pulmonary vascularity normal. Moderate-sized LEFT pleural effusion and small RIGHT pleural effusion, unchanged. Dense consolidation in the LEFT LOWER LOBE and mild atelectasis in the RIGHT  LOWER LOBE, unchanged. No new abnormalities. IMPRESSION: 1. Stable moderate-sized LEFT pleural effusion, small RIGHT pleural effusion, dense LEFT LOWER LOBE atelectasis and mild RIGHT LOWER LOBE atelectasis. 2. No new abnormalities. Electronically Signed   By: Evangeline Dakin M.D.   On: 09/23/2018 09:31   Vas [place] Lower Extremity Arterial Duplex  Result Date: 09/23/2018 LOWER EXTREMITY ARTERIAL DUPLEX STUDY Indications: Dampened left lower extremity pulses. History of cardiac              thromboembolism, pulmonary embolism, and lower extremity DVT.  Current ABI: Not obtained Performing Technologist: Maudry Mayhew MHA, RDMS, RVT, RDCS  Examination Guidelines: A complete evaluation includes B-mode imaging, spectral Doppler, color Doppler, and power Doppler as needed of all accessible portions of each vessel. Bilateral testing is considered an integral part of a complete examination. Limited examinations for reoccurring indications may be performed as noted.  Left Duplex Findings: +-----------+--------+-----+--------+----------+---------------------------+            PSV cm/sRatioStenosisWaveform  Comments                    +-----------+--------+-----+--------+----------+---------------------------+ CFA Distal 65                   triphasic Internal echoes             +-----------+--------+-----+--------+----------+---------------------------+ DFA        81                   triphasic                             +-----------+--------+-----+--------+----------+---------------------------+ SFA Prox   87                   triphasic Internal echoes. Collateral +-----------+--------+-----+--------+----------+---------------------------+ SFA Mid                                   Occluded                    +-----------+--------+-----+--------+----------+---------------------------+ SFA Distal 33                   monophasic                            +-----------+--------+-----+--------+----------+---------------------------+ POP Distal 17                   monophasic                            +-----------+--------+-----+--------+----------+---------------------------+ ATA Distal 19                   monophasic                             +-----------+--------+-----+--------+----------+---------------------------+ PTA Distal 14                   monophasic                            +-----------+--------+-----+--------+----------+---------------------------+ PERO Distal15                   monophasic                            +-----------+--------+-----+--------+----------+---------------------------+  Summary: Left: Left common femoral artery has evidence of low level echoes, suggestive of possible thrombus. There is a collateral coming off of the left proximal superficial femoral artery, occlusion of mid femoral artery, and reconstitution of the distal femoral artery via collateral flow. Thrombus is still noted in the left popliteal vein. Incidental finding: there is evidence of a heterogenous area of the left groin, suggestive of a prominent lymph node.  See table(s) above for measurements and observations.  Electronically signed by Quay Burow MD on 09/23/2018 at 4:59:15 PM.    Final    Vas [place] Lower Extremity Venous (dvt)  Result Date: 09/20/2018  Lower Venous Study Risk Factors: Confirmed PE Large, mobile cardiac thrombus with PFO. Comparison Study: No prior study on file Performing Technologist: Sharion Dove RVS  Examination Guidelines: A complete evaluation includes B-mode imaging, spectral Doppler, color Doppler, and power Doppler as needed of all accessible portions of each vessel. Bilateral testing is considered an integral part of a complete examination. Limited examinations for reoccurring indications may be performed as noted.  Right Venous Findings: +---------+---------------+---------+-----------+----------+---------+          CompressibilityPhasicitySpontaneityPropertiesSummary   +---------+---------------+---------+-----------+----------+---------+ CFV      Full                                         pulsatile +---------+---------------+---------+-----------+----------+---------+  SFJ      Full                                                   +---------+---------------+---------+-----------+----------+---------+ FV Prox  Full                                                   +---------+---------------+---------+-----------+----------+---------+ FV Mid   Full                                                   +---------+---------------+---------+-----------+----------+---------+ FV DistalFull                                                   +---------+---------------+---------+-----------+----------+---------+ PFV      Full                                                   +---------+---------------+---------+-----------+----------+---------+ POP      Full                                         pulsatile +---------+---------------+---------+-----------+----------+---------+ PTV      Full                                                   +---------+---------------+---------+-----------+----------+---------+  PERO     Full                                                   +---------+---------------+---------+-----------+----------+---------+  Left Venous Findings: +---------+---------------+---------+-----------+----------+---------+          CompressibilityPhasicitySpontaneityPropertiesSummary   +---------+---------------+---------+-----------+----------+---------+ CFV      Full                                         pulsatile +---------+---------------+---------+-----------+----------+---------+ SFJ      Full                                                   +---------+---------------+---------+-----------+----------+---------+ FV Prox  Full                                                   +---------+---------------+---------+-----------+----------+---------+ FV Mid   Full                                                   +---------+---------------+---------+-----------+----------+---------+ FV  DistalFull                                                   +---------+---------------+---------+-----------+----------+---------+ PFV      Full                                                   +---------+---------------+---------+-----------+----------+---------+ POP      Partial                                      pulsatile +---------+---------------+---------+-----------+----------+---------+ PTV      Full                                                   +---------+---------------+---------+-----------+----------+---------+ PERO     Full                                                   +---------+---------------+---------+-----------+----------+---------+    Summary: Right: There is no evidence of deep vein thrombosis in the lower extremity. Left: Findings consistent with acute deep vein thrombosis involving the left popliteal vein. Patient stated his left  leg felt cold. Incidentally, the waveforms in the left mid to distal femoral, popliteal, and posterior tibial arteries are significant dampened as compared to the right lower extremity.  See table(s) above for measurements and observations. Electronically signed by Servando Snare MD on 09/20/2018 at 8:52:53 PM.    Final    Discharge Medications: Allergies as of 09/26/2018      Reactions   Lisinopril Swelling   Lip swelling      Medication List    STOP taking these medications   diphenhydrAMINE 25 MG tablet Commonly known as:  BENADRYL   eucerin cream   famotidine 20 MG tablet Commonly known as:  PEPCID   hydrochlorothiazide 25 MG tablet Commonly known as:  HYDRODIURIL   HYDROcodone-acetaminophen 5-325 MG tablet Commonly known as:  NORCO/VICODIN   ketoconazole 2 % shampoo Commonly known as:  NIZORAL   losartan 100 MG tablet Commonly known as:  COZAAR   losartan-hydrochlorothiazide 100-25 MG tablet Commonly known as:  HYZAAR   triamcinolone cream 0.1 % Commonly known as:  KENALOG     TAKE  these medications   albuterol 108 (90 Base) MCG/ACT inhaler Commonly known as:  PROVENTIL HFA;VENTOLIN HFA Inhale 1-2 puffs into the lungs every 6 (six) hours as needed for wheezing or shortness of breath.   carvedilol 6.25 MG tablet Commonly known as:  COREG Take 1 tablet (6.25 mg total) by mouth 2 (two) times daily with a meal.   DUPIXENT [place] Inject 1 Syringe into the skin every 14 (fourteen) days.   furosemide 40 MG tablet Commonly known as:  LASIX Take 1 tablet (40 mg total) by mouth daily. For 5 days then stop   guaiFENesin 600 MG 12 hr tablet Commonly known as:  MUCINEX Take 1 tablet (600 mg total) by mouth 2 (two) times daily as needed.   nicotine 21 mg/24hr patch Commonly known as:  NICODERM CQ - dosed in mg/24 hours Place 1 patch (21 mg total) onto the skin daily.   oxyCODONE 5 MG immediate release tablet Commonly known as:  Oxy IR/ROXICODONE Take 5 mg by mouth every 4-6 hours PRN severe pain   Rivaroxaban 15 & 20 MG Tbpk Start with one 15mg  tablet by mouth twice a day with food. On Day 22, switch to one 20mg  tablet once a day with food.       Follow Up Appointments: Follow-up Information    Waynetta Sandy, MD. Go on 11/20/2018.   Specialties:  Vascular Surgery, Cardiology Why:  Appointment time is at 12:00 pm Contact information: [place] [address] [phone]        Ivin Poot, MD. Go on 11/04/2018.   Specialty:  Cardiothoracic Surgery Why:  PA/LAT CXR to be taken (at [facility] which is in the same building as Dr. Lucianne Lei Trigt's office) on 11/04/2018 at 11:00 am;Appointment time is at 11:30 am Contact information: [place] [address] [phone]        Charlie Pitter, PA-C Follow up on 10/13/2018.   Specialties:  Cardiology, Radiology Why:   Appointment time is at 11:00 am  Contact information: [address] [place] [address] [phone]            Signed: Sharalyn Ink [facility] 09/26/2018, 10:11 AM  patient examined and medical record reviewed,agree with above note. [name] 10/13/2018

## 2018-09-23 ENCOUNTER — Inpatient Hospital Stay (HOSPITAL_COMMUNITY): Payer: Self-pay

## 2018-09-23 LAB — COMPREHENSIVE METABOLIC PANEL
ALT: 12 U/L (ref 0–44)
ALT: 15 U/L (ref 0–44)
AST: 32 U/L (ref 15–41)
AST: 16 U/L (ref 15–41)
Albumin: 2.6 g/dL — ABNORMAL LOW (ref 3.5–5.0)
Albumin: 2.6 g/dL — ABNORMAL LOW (ref 3.5–5.0)
Alkaline Phosphatase: 41 U/L (ref 38–126)
Alkaline Phosphatase: 48 U/L (ref 38–126)
Anion gap: 12 (ref 5–15)
Anion gap: 13 (ref 5–15)
BUN: 25 mg/dL — ABNORMAL HIGH (ref 6–20)
BUN: 25 mg/dL — ABNORMAL HIGH (ref 6–20)
CO2: 25 mmol/L (ref 22–32)
CO2: 28 mmol/L (ref 22–32)
Calcium: 8 mg/dL — ABNORMAL LOW (ref 8.9–10.3)
Calcium: 8.9 mg/dL (ref 8.9–10.3)
Chloride: 92 mmol/L — ABNORMAL LOW (ref 98–111)
Chloride: 91 mmol/L — ABNORMAL LOW (ref 98–111)
Creatinine, Ser: 2.15 mg/dL — ABNORMAL HIGH (ref 0.61–1.24)
Creatinine, Ser: 2.13 mg/dL — ABNORMAL HIGH (ref 0.61–1.24)
GFR calc Af Amer: 42 mL/min — ABNORMAL LOW (ref >60)
GFR calc Af Amer: 42 mL/min — ABNORMAL LOW (ref >60)
GFR calc non Af Amer: 36 mL/min — ABNORMAL LOW (ref >60)
GFR calc non Af Amer: 36 mL/min — ABNORMAL LOW (ref >60)
Glucose, Bld: 97 mg/dL (ref 70–99)
Glucose, Bld: 105 mg/dL — ABNORMAL HIGH (ref 70–99)
Potassium: 3.9 mmol/L (ref 3.5–5.1)
Sodium: 129 mmol/L — ABNORMAL LOW (ref 135–145)
Sodium: 132 mmol/L — ABNORMAL LOW (ref 135–145)
Total Bilirubin: 2.1 mg/dL — ABNORMAL HIGH (ref 0.3–1.2)
Total Bilirubin: 0.6 mg/dL (ref 0.3–1.2)
Total Protein: 6.1 g/dL — ABNORMAL LOW (ref 6.5–8.1)
Total Protein: 6.2 g/dL — ABNORMAL LOW (ref 6.5–8.1)

## 2018-09-23 LAB — CBC
HCT: 32 % — ABNORMAL LOW (ref 39.0–52.0)
Hemoglobin: 10.7 g/dL — ABNORMAL LOW (ref 13.0–17.0)
MCH: 34 pg (ref 26.0–34.0)
MCHC: 33.4 g/dL (ref 30.0–36.0)
MCV: 101.6 fL — ABNORMAL HIGH (ref 80.0–100.0)
Platelets: 306 10*3/uL (ref 150–400)
RBC: 3.15 MIL/uL — ABNORMAL LOW (ref 4.22–5.81)
RDW: 12.4 % (ref 11.5–15.5)
WBC: 7.7 10*3/uL (ref 4.0–10.5)
nRBC: 0 % (ref 0.0–0.2)

## 2018-09-23 LAB — GLUCOSE, CAPILLARY: Glucose-Capillary: 97 mg/dL (ref 70–99)

## 2018-09-23 LAB — COOXEMETRY PANEL
Carboxyhemoglobin: 0.9 % (ref 0.5–1.5)
Methemoglobin: 1.7 % — ABNORMAL HIGH (ref 0.0–1.5)
O2 Saturation: 50.5 %
Total hemoglobin: 11.2 g/dL — ABNORMAL LOW (ref 12.0–16.0)

## 2018-09-23 LAB — HEPARIN LEVEL (UNFRACTIONATED): Heparin Unfractionated: 0.35 IU/mL (ref 0.30–0.70)

## 2018-09-23 LAB — HEMOGLOBIN A1C
Hgb A1c MFr Bld: 5.4 % (ref 4.8–5.6)
Mean Plasma Glucose: 108.28 mg/dL

## 2018-09-23 MED ORDER — CARVEDILOL 6.25 MG PO TABS
6.2500 mg | ORAL_TABLET | Freq: Two times a day (BID) | ORAL | Status: DC
  Administered 2018-09-23 – 2018-09-26 (×6): 6.25 mg via ORAL
  Filled 2018-09-23 (×6): qty 1

## 2018-09-23 MED ORDER — CARVEDILOL 6.25 MG PO TABS
6.2500 mg | ORAL_TABLET | ORAL | Status: AC
  Administered 2018-09-23: 6.25 mg via ORAL
  Filled 2018-09-23: qty 1

## 2018-09-23 MED ORDER — FUROSEMIDE 10 MG/ML IJ SOLN
40.0000 mg | Freq: Every day | INTRAMUSCULAR | Status: DC
  Administered 2018-09-23 – 2018-09-25 (×3): 40 mg via INTRAVENOUS
  Filled 2018-09-23 (×2): qty 4

## 2018-09-23 NOTE — Progress Notes (Addendum)
Subjective  - Doing better each day.  Ambulating in the halls.   Objective (!) 138/103 (!) 103 97.7 F (36.5 C) (Oral) (!) 24 98%  Intake/Output Summary (Last 24 hours) at 09/23/2018 0853 Last data filed at 09/23/2018 0600 Gross per 24 hour  Intake 4044.66 ml  Output 4020 ml  Net 24.66 ml    Brisk left DP/peroneal, right DP Ambulating and doing well, dorsiflexion 5/5 B LE, B quad strength 5/5 Sensation intact and equal B LE      Left Duplex Findings: +-----------+--------+-----+--------+----------+---------------------------+       PSV cm/sRatioStenosisWaveform Comments           +-----------+--------+-----+--------+----------+---------------------------+ CFA Distal 65          triphasic Internal echoes       +-----------+--------+-----+--------+----------+---------------------------+ DFA    81          triphasic                +-----------+--------+-----+--------+----------+---------------------------+ SFA Prox  87          triphasic Internal echoes. Collateral +-----------+--------+-----+--------+----------+---------------------------+ SFA Mid                  Occluded           +-----------+--------+-----+--------+----------+---------------------------+ SFA Distal 33          monophasic               +-----------+--------+-----+--------+----------+---------------------------+ POP Distal 17          monophasic               +-----------+--------+-----+--------+----------+---------------------------+ ATA Distal 19          monophasic               +-----------+--------+-----+--------+----------+---------------------------+ PTA Distal 14          monophasic                +-----------+--------+-----+--------+----------+---------------------------+ PERO Distal15          monophasic               +-----------+--------+-----+--------+----------+---------------------------+     Summary: Left: Left common femoral artery has evidence of low level echoes, suggestive of possible thrombus. There is a collateral coming off of the left proximal superficial femoral artery, occlusion of mid femoral artery, and reconstitution of the distal femoral artery via collateral flow.   Thrombus is still noted in the left popliteal vein.  Assessment/Planning: Brisk doppler signal left LE  Improved motor and doppler signals Left LE.  Roxy Horseman 09/23/2018 8:53 AM --  Laboratory Lab Results: Recent Labs    09/22/18 0227 09/23/18 0417  WBC 9.6 7.7  HGB 10.3* 10.7*  HCT 29.9* 32.0*  PLT 217 306   BMET Recent Labs    09/22/18 0227 09/23/18 0417  NA 132* 129*  K 4.3 SPECIMEN HEMOLYZED. HEMOLYSIS MAY AFFECT INTEGRITY OF RESULTS.  CL 98 92*  CO2 24 25  GLUCOSE 126* 97  BUN 23* 25*  CREATININE 2.11* 2.15*  CALCIUM 8.0* 8.0*    COAG Lab Results  Component Value Date   INR 1.22 09/19/2018   No results found for: PTT   I have independently interviewed and examined patient and agree with PA assessment and plan above. Will need angiogram eventually but currently is asymptomatic.  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  Will follow peripherally.  Brandon C. Donzetta Matters, MD [facility] Office: [phone] Pager: [phone]

## 2018-09-23 NOTE — Progress Notes (Signed)
Multiple family members present when patient transferred from [facility] to 2C06.  Brother told this nurse that his brother can be "bossy" and arrogant.  He said he felt he had "multiple personality disorder".  This has not been validated.  Will continue to support patient and family through this difficult time.

## 2018-09-23 NOTE — Progress Notes (Signed)
[place] for heparin Indication: biatrial thrombus, PE/DVT  Allergies  Allergen Reactions  . Lisinopril Swelling    Lip swelling    Patient Measurements: Height: 5\' 9"  (175.3 cm) Weight: 171 lb 4.8 oz (77.7 kg) IBW/kg (Calculated) : 70.7 Heparin Dosing Weight: 77kg  Vital Signs: Temp: 97.7 F (36.5 C) (12/04 0820) Temp Source: Oral (12/04 0820) BP: 138/103 (12/04 0700) Pulse Rate: 103 (12/04 0700)  Labs: Recent Labs    09/21/18 0500 09/21/18 1600 09/22/18 0227 09/22/18 1600 09/23/18 0417  HGB 10.4*  --  10.3*  --  10.7*  HCT 29.8*  --  29.9*  --  32.0*  PLT 189  --  217  --  306  HEPARINUNFRC  --  <0.10* 0.25* 0.21* 0.35  CREATININE 2.07* 2.09* 2.11*  --  2.15*    Estimated Creatinine Clearance: 43.4 mL/min (A) (by C-G formula based on SCr of 2.15 mg/dL (H)).  Assessment: 47 yo m presenting with acute PE diagnosed by VQ, large mobile mass in RA. Not on anticoagulation PTA. Returns from surgery today. Pharmacy consulted to restart heparin.  Heparin level 0.35 this morning on 1600 units/hr. No rate changes this morning. No issues noted. Will follow along with CVTS in heparin dosing.    Goal of Therapy:  Eventual heparin level goal of 0.4-0.5 per PVT  Monitor platelets by anticoagulation protocol: Yes   Plan:  Continue heparin at 1600 units/hr Monitor daily heparin level and CBC, s/sx bleeding  Erin Hearing PharmD., BCPS Clinical Pharmacist 09/23/2018 8:23 AM

## 2018-09-24 ENCOUNTER — Inpatient Hospital Stay (HOSPITAL_COMMUNITY): Payer: Self-pay

## 2018-09-24 LAB — COMPREHENSIVE METABOLIC PANEL
ALT: 13 U/L (ref 0–44)
AST: 14 U/L — ABNORMAL LOW (ref 15–41)
Albumin: 2.4 g/dL — ABNORMAL LOW (ref 3.5–5.0)
Alkaline Phosphatase: 39 U/L (ref 38–126)
Anion gap: 13 (ref 5–15)
BUN: 23 mg/dL — ABNORMAL HIGH (ref 6–20)
CO2: 28 mmol/L (ref 22–32)
Calcium: 8.3 mg/dL — ABNORMAL LOW (ref 8.9–10.3)
Chloride: 96 mmol/L — ABNORMAL LOW (ref 98–111)
Creatinine, Ser: 1.99 mg/dL — ABNORMAL HIGH (ref 0.61–1.24)
GFR calc Af Amer: 46 mL/min — ABNORMAL LOW (ref >60)
GFR calc non Af Amer: 39 mL/min — ABNORMAL LOW (ref >60)
Glucose, Bld: 102 mg/dL — ABNORMAL HIGH (ref 70–99)
Potassium: 3.4 mmol/L — ABNORMAL LOW (ref 3.5–5.1)
Sodium: 137 mmol/L (ref 135–145)
Total Bilirubin: 0.4 mg/dL (ref 0.3–1.2)
Total Protein: 5.8 g/dL — ABNORMAL LOW (ref 6.5–8.1)

## 2018-09-24 LAB — CBC
HCT: 30 % — ABNORMAL LOW (ref 39.0–52.0)
Hemoglobin: 9.8 g/dL — ABNORMAL LOW (ref 13.0–17.0)
MCH: 33.1 pg (ref 26.0–34.0)
MCHC: 32.7 g/dL (ref 30.0–36.0)
MCV: 101.4 fL — ABNORMAL HIGH (ref 80.0–100.0)
Platelets: 338 10*3/uL (ref 150–400)
RBC: 2.96 MIL/uL — ABNORMAL LOW (ref 4.22–5.81)
RDW: 12.4 % (ref 11.5–15.5)
WBC: 5.6 10*3/uL (ref 4.0–10.5)
nRBC: 0 % (ref 0.0–0.2)

## 2018-09-24 LAB — HEPARIN LEVEL (UNFRACTIONATED): Heparin Unfractionated: 0.48 IU/mL (ref 0.30–0.70)

## 2018-09-24 MED ORDER — POTASSIUM CHLORIDE CRYS ER 20 MEQ PO TBCR
20.0000 meq | EXTENDED_RELEASE_TABLET | Freq: Every day | ORAL | Status: AC
  Administered 2018-09-24 – 2018-09-25 (×2): 20 meq via ORAL
  Filled 2018-09-24 (×2): qty 1

## 2018-09-24 MED ORDER — POTASSIUM CHLORIDE CRYS ER 20 MEQ PO TBCR
20.0000 meq | EXTENDED_RELEASE_TABLET | Freq: Once | ORAL | Status: AC
  Administered 2018-09-24: 20 meq via ORAL
  Filled 2018-09-24: qty 1

## 2018-09-24 NOTE — Progress Notes (Signed)
CARDIAC REHAB PHASE I   PRE:  Rate/Rhythm: 73 SR    BP: sitting 128/99    SaO2: 98 4L  MODE:  Ambulation: 1500 ft   POST:  Rate/Rhythm: 90 SR    BP: sitting 139/107     SaO2: 90-91 RA  Pt moving well, out of bed independently and ambulated independently pushing IV pole. Initially 2L, after 2 laps 98 2L. Walked another 2 laps on RA. SaO2 90-92 RA. Pt denies any c/o. He is congested but refuses to cough. To recliner, left on 1L since 90 RA. Practiced IS, 1500 mL, good mechanics. Encouraged 2 more walks today. [id number]  [place], ACSM 09/24/2018 11:23 AM

## 2018-09-24 NOTE — Progress Notes (Signed)
[place] for heparin Indication: biatrial thrombus, PE/DVT  Allergies  Allergen Reactions  . Lisinopril Swelling    Lip swelling    Patient Measurements: Height: 5\' 9"  (175.3 cm) Weight: 165 lb 2 oz (74.9 kg)(scale A) IBW/kg (Calculated) : 70.7 Heparin Dosing Weight: 77kg  Vital Signs: Temp: 98.4 F (36.9 C) (12/05 0800) Temp Source: Oral (12/05 0800) BP: 114/88 (12/05 0800) Pulse Rate: 74 (12/05 0742)  Labs: Recent Labs    09/22/18 0227 09/22/18 1600 09/23/18 0417 09/23/18 1025 09/24/18 0444  HGB 10.3*  --  10.7*  --  9.8*  HCT 29.9*  --  32.0*  --  30.0*  PLT 217  --  306  --  338  HEPARINUNFRC 0.25* 0.21* 0.35  --  0.48  CREATININE 2.11*  --  2.15* 2.13* 1.99*    Estimated Creatinine Clearance: 46.9 mL/min (A) (by C-G formula based on SCr of 1.99 mg/dL (H)).  Assessment: 47 yo m presenting with acute PE diagnosed by VQ, large mobile mass in RA. Not on anticoagulation PTA. Returns from surgery today. Pharmacy consulted to restart heparin.  Heparin level 0.48 this morning on 1600 units/hr. Rate decreased to 1500 this morning by surgery. No issues noted. Will follow along with CVTS in heparin dosing.    Goal of Therapy:  Eventual heparin level goal of 0.4-0.5 per PVT  Monitor platelets by anticoagulation protocol: Yes   Plan:  Reduce heparin to 1500 units/hr Monitor daily heparin level and CBC, s/sx bleeding Plan to transition to xarelto 12/6  Erin Hearing PharmD., BCPS Clinical Pharmacist 09/24/2018 9:48 AM

## 2018-09-24 NOTE — Progress Notes (Addendum)
      [address]       ,[place] [address]             [phone]        5 Days Post-Op Procedure(s) (LRB): CLOSURE OF PATENT FORAMEN OVALE (N/A) INTRAOPERATIVE TRANSESOPHAGEAL ECHOCARDIOGRAM (N/A) CLIPPING OF LEFT ATRIAL APPENDAGE  Subjective: Patient without specific complaints this am. He does state the food is terrible and it is hard for him "to cough stuff up"  Objective: Vital signs in last 24 hours: Temp:  [97.5 F (36.4 C)-98.7 F (37.1 C)] 98.3 F (36.8 C) (12/05 0433) Pulse Rate:  [77-105] 77 (12/05 0433) Cardiac Rhythm: Sinus tachycardia (12/05 0420) Resp:  [15-23] 19 (12/05 0433) BP: (111-149)/(77-102) 111/78 (12/05 0433) SpO2:  [93 %-99 %] 93 % (12/05 0433) Weight:  [74.9 kg] 74.9 kg (12/05 0500)   Current Weight  09/24/18 74.9 kg       Intake/Output from previous day: 12/04 0701 - 12/05 0700 In: 4212 [P.O.:840; I.V.:372; IV Piggyback:3000] Out: 2751 [Urine:2750; Stool:1]   Physical Exam:  Cardiovascular: RRR Pulmonary: Coarse breath sounds bilaterally Abdomen: Soft, non tender, bowel sounds present. Extremities: No lower extremity edema. Feet/legs warm Wounds: Clean and dry.  No erythema or signs of infection.  Lab Results: CBC: Recent Labs    09/23/18 0417 09/24/18 0444  WBC 7.7 5.6  HGB 10.7* 9.8*  HCT 32.0* 30.0*  PLT 306 338   BMET:  Recent Labs    09/23/18 1025 09/24/18 0444  NA 132* 137  K 3.9 3.4*  CL 91* 96*  CO2 28 28  GLUCOSE 105* 102*  BUN 25* 23*  CREATININE 2.13* 1.99*  CALCIUM 8.9 8.3*    PT/INR:  Lab Results  Component Value Date   INR 1.22 09/19/2018   ABG:  INR: Will add last result for INR, ABG once components are confirmed Will add last 4 CBG results once components are confirmed  Assessment/Plan:  1. CV - Previous a fib. 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 On Coreg 6.25 mg bid 2.  Pulmonary - On 4 liters of oxygen via [place]. Wean as able. CXR this am appears to show some  decrease in left pleural effusion, LLL and RML atelectasis, and small right pleural effusion. Continue Mucomyst, Mucinex, and Xopenex. Encourage incentive spirometer 3. CKD- Creatinine decreased to 1.99. Creatinine upon admission 2.10 4.  Acute blood loss anemia - H and H this am 9.8 and 30 5. Gently supplement potassium 6. Volume overload-on Lasix 40 mg IV daily 7. ID-on Ceftazidime for possible pulmonary infection 8. DVT let popliteal vein, bi atrial thrombus-on Heparin drip 9. Hope to remove EPW  [name] 09/24/2018,7:18 AM [phone]  Pulmonary status improving Will DC EPWs when off heparin drip-[stop heparin at 6 AM December 6, pacing wires at 10 AM.  Start Xarelto dose per pharmacy at noon] Transition to po Xarelto tomorrow POD #7 Wean off oxygen and patient should be ready for discharge home on Saturday  patient examined and medical record reviewed,agree with above note. [name] 09/24/2018

## 2018-09-25 ENCOUNTER — Telehealth: Payer: Self-pay | Admitting: Vascular Surgery

## 2018-09-25 LAB — CBC
HCT: 30.8 % — ABNORMAL LOW (ref 39.0–52.0)
Hemoglobin: 10.3 g/dL — ABNORMAL LOW (ref 13.0–17.0)
MCH: 33.9 pg (ref 26.0–34.0)
MCHC: 33.4 g/dL (ref 30.0–36.0)
MCV: 101.3 fL — ABNORMAL HIGH (ref 80.0–100.0)
Platelets: 353 10*3/uL (ref 150–400)
RBC: 3.04 MIL/uL — ABNORMAL LOW (ref 4.22–5.81)
RDW: 12.5 % (ref 11.5–15.5)
WBC: 6.6 10*3/uL (ref 4.0–10.5)
nRBC: 0 % (ref 0.0–0.2)

## 2018-09-25 LAB — COMPREHENSIVE METABOLIC PANEL
ALT: 18 U/L (ref 0–44)
AST: 18 U/L (ref 15–41)
Albumin: 2.5 g/dL — ABNORMAL LOW (ref 3.5–5.0)
Alkaline Phosphatase: 41 U/L (ref 38–126)
Anion gap: 12 (ref 5–15)
BUN: 18 mg/dL (ref 6–20)
CO2: 27 mmol/L (ref 22–32)
Calcium: 8.6 mg/dL — ABNORMAL LOW (ref 8.9–10.3)
Chloride: 96 mmol/L — ABNORMAL LOW (ref 98–111)
Creatinine, Ser: 1.89 mg/dL — ABNORMAL HIGH (ref 0.61–1.24)
GFR calc Af Amer: 49 mL/min — ABNORMAL LOW (ref >60)
GFR calc non Af Amer: 42 mL/min — ABNORMAL LOW (ref >60)
Glucose, Bld: 103 mg/dL — ABNORMAL HIGH (ref 70–99)
Potassium: 4.1 mmol/L (ref 3.5–5.1)
Sodium: 135 mmol/L (ref 135–145)
Total Bilirubin: 0.3 mg/dL (ref 0.3–1.2)
Total Protein: 6.3 g/dL — ABNORMAL LOW (ref 6.5–8.1)

## 2018-09-25 MED ORDER — LEVALBUTEROL HCL 0.63 MG/3ML IN NEBU: 0.6300 mg | INHALATION_SOLUTION | Freq: Four times a day (QID) | RESPIRATORY_TRACT | Status: DC | PRN

## 2018-09-25 MED ORDER — NICOTINE 21 MG/24HR TD PT24: 21.0000 mg | MEDICATED_PATCH | Freq: Every day | TRANSDERMAL | 0 refills | Status: DC

## 2018-09-25 MED ORDER — LEVALBUTEROL HCL 1.25 MG/0.5ML IN NEBU
1.2500 mg | INHALATION_SOLUTION | Freq: Two times a day (BID) | RESPIRATORY_TRACT | Status: DC
  Administered 2018-09-25 – 2018-09-26 (×2): 1.25 mg via RESPIRATORY_TRACT
  Filled 2018-09-25 (×2): qty 0.5

## 2018-09-25 MED ORDER — GUAIFENESIN ER 600 MG PO TB12: 600.0000 mg | ORAL_TABLET | Freq: Two times a day (BID) | ORAL | Status: DC | PRN

## 2018-09-25 MED ORDER — RIVAROXABAN (XARELTO) VTE STARTER PACK (15 & 20 MG): ORAL_TABLET | ORAL | 0 refills | Status: DC

## 2018-09-25 MED ORDER — OXYCODONE HCL 5 MG PO TABS: ORAL_TABLET | ORAL | 0 refills | Status: DC

## 2018-09-25 MED ORDER — RIVAROXABAN 20 MG PO TABS: 20.0000 mg | ORAL_TABLET | Freq: Every day | ORAL | Status: DC

## 2018-09-25 MED ORDER — RIVAROXABAN 20 MG PO TABS: 20.0000 mg | ORAL_TABLET | Freq: Every day | ORAL | 0 refills | Status: DC

## 2018-09-25 MED ORDER — RIVAROXABAN 15 MG PO TABS: 15.0000 mg | ORAL_TABLET | Freq: Two times a day (BID) | ORAL | 0 refills | Status: DC

## 2018-09-25 MED ORDER — CARVEDILOL 6.25 MG PO TABS: 6.2500 mg | ORAL_TABLET | Freq: Two times a day (BID) | ORAL | 0 refills | Status: DC

## 2018-09-25 MED ORDER — RIVAROXABAN 15 MG PO TABS
15.0000 mg | ORAL_TABLET | Freq: Two times a day (BID) | ORAL | Status: DC
  Administered 2018-09-25 – 2018-09-26 (×2): 15 mg via ORAL
  Filled 2018-09-25 (×3): qty 1

## 2018-09-25 MED FILL — XARELTO STARTER PACK: 15 & 20 | 30 days supply | Qty: 51 | Fill #0

## 2018-09-25 MED FILL — oxyCODONE HCL 5 MG TABS: 5 | 5 days supply | Qty: 30 | Fill #0

## 2018-09-25 MED FILL — CARVEDILOL 6.25 MG TABLET: 6.25 | 30 days supply | Qty: 60 | Fill #0

## 2018-09-25 NOTE — Progress Notes (Signed)
CARDIAC REHAB PHASE I   Offered to walk with pt, pt declining, stating he is taking a nap. Pt sats 91 on RA lying in bed. Pt instructed to shower and monitor incision daily. Reviewed importance of sternal precautions with pt. Pt given in-the-tube sheet, and heart healthy diet. Encouraged continued use of IS. Reviewed restrictions and exercise guidelines. Will continue to follow.  [phone] Rufina Falco, RN BSN 09/25/2018 1:30 PM

## 2018-09-25 NOTE — Discharge Instructions (Addendum)
Discharge Instructions:  1. You may shower, please wash incisions daily with soap and water and keep dry.  If you wish to cover wounds with dressing you may do so but please keep clean and change daily.  No tub baths or swimming until incisions have completely healed.  If your incisions become red or develop any drainage please call our office at [phone]  2. No Driving until cleared by Dr. Lucianne Lei Trigt's  office and you are no longer using narcotic pain medications  3. Monitor your weight daily.. Please use the same scale and weigh at same time... If you gain 5-10 lbs in 48 hours with associated lower extremity swelling, please contact our office at [phone]  4. Fever of 101.5 for at least 24 hours with no source, please contact our office at [phone]  5. Activity- up as tolerated, please walk at least 3 times per day.  Avoid strenuous activity, no lifting, pushing, or pulling with your arms over 8-10 lbs for a minimum of 6 weeks  6. If any questions or concerns arise, please do not hesitate to contact our office at [phone] ----------  Information on my medicine - XARELTO (rivaroxaban)  [place]? Xarelto was prescribed to treat blood clots that may have been found in the veins of your legs (deep vein thrombosis) or in your lungs (pulmonary embolism) and to reduce the risk of them occurring again.  What do you need to know about Xarelto? The starting dose is one 15 mg tablet taken TWICE daily with food for the FIRST 21 DAYS then on 10/16/18 the dose is changed to one 20 mg tablet taken ONCE A DAY with your evening meal.  DO NOT stop taking Xarelto without talking to the health care provider who prescribed the medication.  Refill your prescription for 20 mg tablets before you run out.  After discharge, you should have regular check-up appointments with your healthcare provider that is prescribing your Xarelto.  In the future your dose may need to  be changed if your kidney function changes by a significant amount.  What do you do if you miss a dose? If you are taking Xarelto TWICE DAILY and you miss a dose, take it as soon as you remember. You may take two 15 mg tablets (total 30 mg) at the same time then resume your regularly scheduled 15 mg twice daily the next day.  If you are taking Xarelto ONCE DAILY and you miss a dose, take it as soon as you remember on the same day then continue your regularly scheduled once daily regimen the next day. Do not take two doses of Xarelto at the same time.   Important Safety Information Xarelto is a blood thinner medicine that can cause bleeding. You should call your healthcare provider right away if you experience any of the following: ? Bleeding from an injury or your nose that does not stop. ? Unusual colored urine (red or dark brown) or unusual colored stools (red or black). ? Unusual bruising for unknown reasons. ? A serious fall or if you hit your head (even if there is no bleeding).  Some medicines may interact with Xarelto and might increase your risk of bleeding while on Xarelto. To help avoid this, consult your healthcare provider or pharmacist prior to using any new prescription or non-prescription medications, including herbals, vitamins, non-steroidal anti-inflammatory drugs (NSAIDs) and supplements.  This website has more information on Xarelto: [email or web address]-benson.[email or web address]

## 2018-09-25 NOTE — Telephone Encounter (Signed)
-----   Message from Waynetta Sandy, MD sent at 09/25/2018  8:57 AM EST ----- [name] [phone] [birth year]  F/u with me/np/pa in 4-8 weeks with ABI

## 2018-09-25 NOTE — Telephone Encounter (Signed)
sch appt lvm mld ltr 11/20/2018 11am ABI 12pm f/u MD

## 2018-09-25 NOTE — Progress Notes (Signed)
ANTICOAGULATION CONSULT NOTE - Initial Consult  Pharmacy Consult for Heparin>>>>>Xarelto Indication: Bi-atrial thrombus, PE/DVT  Allergies  Allergen Reactions  . Lisinopril Swelling    Lip swelling    Patient Measurements: Height: 5\' 9"  (175.3 cm) Weight: 160 lb 4.4 oz (72.7 kg) IBW/kg (Calculated) : 70.7  Vital Signs: Temp: 98.2 F (36.8 C) (12/06 0327) Temp Source: Oral (12/06 0327) BP: 126/80 (12/06 0327) Pulse Rate: 85 (12/06 0327)  Labs: Recent Labs    09/22/18 1600  09/23/18 0417 09/23/18 1025 09/24/18 0444 09/25/18 0531  HGB  --    < > 10.7*  --  9.8* 10.3*  HCT  --   --  32.0*  --  30.0* 30.8*  PLT  --   --  306  --  338 353  HEPARINUNFRC 0.21*  --  0.35  --  0.48  --   CREATININE  --    < > 2.15* 2.13* 1.99* 1.89*   < > = values in this interval not displayed.    Estimated Creatinine Clearance: 49.4 mL/min (A) (by C-G formula based on SCr of 1.89 mg/dL (H)).   Medical History: Past Medical History:  Diagnosis Date  . Asthma   . Chronic kidney disease   . Eczema herpeticum   . Erythroderma   . Erythroderma   . Hypertension   . Wears glasses     Assessment: Pt now POD#6, planning to pull external pacing wires around 1100 today, and start Xarelto at 1300 today, heparin was stopped at 0700 today in anticipation of removing the wires.   Goal of Therapy:  Monitor platelets by anticoagulation protocol: Yes   Plan:  -DC heparin  -Xarelto 15 mg twice daily with meals for 21 days followed by Xarelto 20 mg daily with supper -Daily CBC while inpatient -Monitor for bleeding  Narda Bonds 09/25/2018,7:41 AM

## 2018-09-25 NOTE — Progress Notes (Addendum)
      [address]       [place] [address]             [phone]        6 Days Post-Op Procedure(s) (LRB): CLOSURE OF PATENT FORAMEN OVALE (N/A) INTRAOPERATIVE TRANSESOPHAGEAL ECHOCARDIOGRAM (N/A) CLIPPING OF LEFT ATRIAL APPENDAGE  Subjective: Patient without specific complaints this am.   Objective: Vital signs in last 24 hours: Temp:  [97.6 F (36.4 C)-98.4 F (36.9 C)] 98.2 F (36.8 C) (12/06 0327) Pulse Rate:  [74-87] 85 (12/06 0327) Cardiac Rhythm: Normal sinus rhythm (12/06 0050) Resp:  [14-23] 20 (12/06 0327) BP: (109-134)/(70-99) 126/80 (12/06 0327) SpO2:  [91 %-100 %] 93 % (12/06 0327) FiO2 (%):  [24 %-36 %] 24 % (12/05 1350) Weight:  [72.7 kg] 72.7 kg (12/06 0643)   Current Weight  09/25/18 72.7 kg       Intake/Output from previous day: 12/05 0701 - 12/06 0700 In: 1833 [P.O.:1200; I.V.:333; IV Piggyback:300] Out: 2475 [Urine:2475]   Physical Exam:  Cardiovascular: RRR Pulmonary: Slightly diminished at bases Abdomen: Soft, non tender, bowel sounds present. Extremities: No lower extremity edema. Wounds: Clean and dry.  No erythema or signs of infection.  Lab Results: CBC: Recent Labs    09/24/18 0444 09/25/18 0531  WBC 5.6 6.6  HGB 9.8* 10.3*  HCT 30.0* 30.8*  PLT 338 353   BMET:  Recent Labs    09/24/18 0444 09/25/18 0531  NA 137 135  K 3.4* 4.1  CL 96* 96*  CO2 28 27  GLUCOSE 102* 103*  BUN 23* 18  CREATININE 1.99* 1.89*  CALCIUM 8.3* 8.6*    PT/INR:  Lab Results  Component Value Date   INR 1.22 09/19/2018   ABG:  INR: Will add last result for INR, ABG once components are confirmed Will add last 4 CBG results once components are confirmed  Assessment/Plan:  1. CV - Previous a fib. 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 On Coreg 6.25 mg bid 2.  Pulmonary - He is down to 1 liter of oxygen via [place]. Continue Mucomyst, Mucinex, and Xopenex. Encourage incentive spirometer 3. CKD- Creatinine slightly  decreased to 1.89. Creatinine upon admission 2.10 4.  Acute blood loss anemia - H and H this am 10.3 and 30.8 6. Volume overload-on Lasix 40 mg IV daily 7. ID-on Ceftazidime for possible pulmonary infection 8. DVT let popliteal vein, bi atrial thrombus-on Heparin drip which was stopped this am around 7. Will remove EPW at 11 am and then start Rivaroxaban around 1 pm   Sharalyn Ink ZimmermanPA-C 09/25/2018,7:23 AM [phone]  Patient should be ready for DC home 12-7 if off O2  Will need Pharm assistance with getting home meds- coreg, xarelto  Short term lasix Antibiotic DCed   ASA stopped while loading xarelto DC instructions reviewed with patient  patient examined and medical record reviewed,agree with above note. [name] 09/25/2018

## 2018-09-25 NOTE — Progress Notes (Signed)
   Patient's feet appear warm and well-perfused although I cannot palpate pulses in the left lower extremity beyond the left femoral.  This appears to be a chronic process.  Given his other ongoing issues including recent sternotomy as well as chronic renal insufficiency.  I will set him up to be seen back in our office in 4 to 6 weeks with ABIs.  If he has issues before that we will certainly see him sooner.  Tywaun Hiltner C. Donzetta Matters, MD [facility] [place] Office: [phone] Pager: [phone]

## 2018-09-25 NOTE — Care Management Note (Signed)
Case Management Note  Patient Details  Name: [name] MRN: [id number] Date of Birth: [birth year]  Subjective/Objective:      Pt is s/p sternal surgery              Action/Plan:   PTA independent from home.  Pt is active with IM clinic - pt informed CM that he gets his prescription filled at clinic pharmacy with the medication assistance program.  Pt will discharge home on Xarelto - TOC will provide first 30 day supply of discharge meds at discharge   Expected Discharge Date:                  Expected Discharge Plan:  Home/Self Care  In-House Referral:     Discharge planning Services  CM Consult  Post Acute Care Choice:    Choice offered to:     DME Arranged:    DME Agency:     HH Arranged:    [place] Agency:     Status of Service:  In process, will continue to follow  If discussed at Long Length of Stay Meetings, dates discussed:    Additional Comments: 09/25/2018 CM reached out to IM clinic pharm Gentry Fitz at [phone] to ensure pt can get help with assistance with Xarelto Maryclare Labrador, RN 09/25/2018, 1:33 PM

## 2018-09-25 NOTE — Plan of Care (Signed)

## 2018-09-26 LAB — BASIC METABOLIC PANEL
Anion gap: 12 (ref 5–15)
BUN: 17 mg/dL (ref 6–20)
CHLORIDE: 97 mmol/L — AB (ref 98–111)
CO2: 26 mmol/L (ref 22–32)
CREATININE: 2.1 mg/dL — AB (ref 0.61–1.24)
Calcium: 9.2 mg/dL (ref 8.9–10.3)
GFR calc Af Amer: 43 mL/min — ABNORMAL LOW (ref >60)
GFR calc non Af Amer: 37 mL/min — ABNORMAL LOW (ref >60)
Glucose, Bld: 124 mg/dL — ABNORMAL HIGH (ref 70–99)
POTASSIUM: 4.2 mmol/L (ref 3.5–5.1)
Sodium: 135 mmol/L (ref 135–145)

## 2018-09-26 MED ORDER — FUROSEMIDE 40 MG PO TABS
40.0000 mg | ORAL_TABLET | Freq: Every day | ORAL | Status: DC
  Administered 2018-09-26: 40 mg via ORAL
  Filled 2018-09-26: qty 1

## 2018-09-26 MED ORDER — FUROSEMIDE 40 MG PO TABS: 40.0000 mg | ORAL_TABLET | Freq: Every day | ORAL | 0 refills | Status: DC

## 2018-09-26 NOTE — Progress Notes (Signed)
CARDIAC REHAB PHASE I   PRE:  Rate/Rhythm: SR 85  BP:  Sitting: 117/76       MODE:  Ambulation: 1100 ft   POST:  Rate/Rhythm: ST 95  BP:  Sitting: 129/89       1210-1235 Pt ambulated in hallway 1123ft independently. Pt discussing issues with family dynamics. C/o incisional pain.  RN made aware.    Noel Christmas, RN 09/26/2018 12:33 PM

## 2018-09-26 NOTE — Progress Notes (Signed)
[facility] will sign off.   Medication Recommendations:  none Other recommendations (labs, testing, etc):  n/a Follow up as an outpatient:  Scheduled 12/24 with Melina Copa, PA-C  Shaunte Weissinger C. Oval Linsey, MD, [facility] 09/26/2018 9:59 AM

## 2018-09-26 NOTE — Progress Notes (Addendum)
      [address]       [place] [address]             [phone]        7 Days Post-Op Procedure(s) (LRB): CLOSURE OF PATENT FORAMEN OVALE (N/A) INTRAOPERATIVE TRANSESOPHAGEAL ECHOCARDIOGRAM (N/A) CLIPPING OF LEFT ATRIAL APPENDAGE  Subjective: Patient with sternal incisional pain this am.  Objective: Vital signs in last 24 hours: Temp:  [97.5 F (36.4 C)-98.6 F (37 C)] 97.5 F (36.4 C) (12/07 0830) Pulse Rate:  [82-87] 85 (12/06 2102) Cardiac Rhythm: Normal sinus rhythm (12/07 0830) Resp:  [15-26] 15 (12/07 0830) BP: (108-131)/(71-86) 116/81 (12/07 0830) SpO2:  [92 %-100 %] 93 % (12/07 0830) Weight:  [71.1 kg] 71.1 kg (12/07 0311)   Current Weight  09/26/18 71.1 kg       Intake/Output from previous day: 12/06 0701 - 12/07 0700 In: 3 [I.V.:3] Out: 1300 [Urine:1300]   Physical Exam:  Cardiovascular: RRR Pulmonary: Slightly diminished at bases Abdomen: Soft, non tender, bowel sounds present. Extremities: No lower extremity edema. Wounds: Clean and dry.  No erythema or signs of infection.  Lab Results: CBC: Recent Labs    09/24/18 0444 09/25/18 0531  WBC 5.6 6.6  HGB 9.8* 10.3*  HCT 30.0* 30.8*  PLT 338 353   BMET:  Recent Labs    09/25/18 0531 09/26/18 0411  NA 135 135  K 4.1 4.2  CL 96* 97*  CO2 27 26  GLUCOSE 103* 124*  BUN 18 17  CREATININE 1.89* 2.10*  CALCIUM 8.6* 9.2    PT/INR:  Lab Results  Component Value Date   INR 1.22 09/19/2018   ABG:  INR: Will add last result for INR, ABG once components are confirmed Will add last 4 CBG results once components are confirmed  Assessment/Plan:  1. CV - Previous a fib. 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 On Coreg 6.25 mg bid 2.  Pulmonary - On room air. Continue Mucomyst, Mucinex, and Xopenex. Encourage incentive spirometer 3. CKD- Creatinine slightly increased to 2.10.Creatinine upon admission 2.10 4.  Acute blood loss anemia - H and H this am 10.3 and  30.8 6. Volume overload-has been on Lasix 40 mg IV daily. Per Dr. Prescott Gum, will give short term course of oral Lasix 7. DVT let popliteal vein, bi atrial thrombus-on Rivaroxaban 15 mg bid until 12/26 then 20 mg daily  8. Likely discharge later today. Prescriptions, except oral Lasix, supposed to be filled by [facility] pharmacy yesterday.  Donielle M ZimmermanPA-C 09/26/2018,9:19 AM [phone]  Patient seen and examined, agree with above Home later today  Camrynn Mcclintic C. Roxan Hockey, MD [facility] [phone]

## 2018-09-26 NOTE — Plan of Care (Signed)

## 2018-09-26 NOTE — Progress Notes (Signed)
Removed central line and PIV access x1. Pt received the discharge instructions and medications from pharmacy by charge nurse. Pt was waiting for his mom due to no clothing. Pt's family took his belongings. HS [employer]

## 2018-10-08 ENCOUNTER — Ambulatory Visit: Payer: Self-pay | Admitting: Pharmacist

## 2018-10-08 DIAGNOSIS — I10 Essential (primary) hypertension: Secondary | ICD-10-CM

## 2018-10-08 NOTE — Progress Notes (Signed)
S: [name] is a 47 y.o. male reports to clinical pharmacist appointment for help with medication access  Allergies  Allergen Reactions  . Lisinopril Swelling    Lip swelling   Medication Sig  albuterol (PROVENTIL HFA;VENTOLIN HFA) 108 (90 BASE) MCG/ACT inhaler Inhale 1-2 puffs into the lungs every 6 (six) hours as needed for wheezing or shortness of breath.  carvedilol (COREG) 6.25 MG tablet Take 1 tablet (6.25 mg total) by mouth 2 (two) times daily with a meal.  Dupilumab (DUPIXENT [place]) Inject 1 Syringe into the skin every 14 (fourteen) days.  furosemide (LASIX) 40 MG tablet Take 1 tablet (40 mg total) by mouth daily. For 5 days then stop  guaiFENesin (MUCINEX) 600 MG 12 hr tablet Take 1 tablet (600 mg total) by mouth 2 (two) times daily as needed.  nicotine (NICODERM CQ - DOSED IN MG/24 HOURS) 21 mg/24hr patch Place 1 patch (21 mg total) onto the skin daily.  oxyCODONE (OXY IR/ROXICODONE) 5 MG immediate release tablet Take 5 mg by mouth every 4-6 hours PRN severe pain  Rivaroxaban 15 & 20 MG TBPK Start with one 15mg  tablet by mouth twice a day with food. On Day 22, switch to one 20mg  tablet once a day with food.   Past Medical History:  Diagnosis Date  . Asthma   . Chronic kidney disease   . Eczema herpeticum   . Erythroderma   . Erythroderma   . Hypertension   . Wears glasses    Social History   Socioeconomic History  . Marital status: Married    Spouse name: Not on file  . Number of children: Not on file  . Years of education: Not on file  . Highest education level: Not on file  Occupational History  . Not on file  Social Needs  . Financial resource strain: Not on file  . Food insecurity:    Worry: Not on file    Inability: Not on file  . Transportation needs:    Medical: Not on file    Non-medical: Not on file  Tobacco Use  . Smoking status: Current Every Day Smoker    Packs/day: 1.00  . Smokeless tobacco: Never Used  Substance and Sexual Activity  .  Alcohol use: Yes    Comment: OCCASIONALLY  . Drug use: No  . Sexual activity: Yes    Birth control/protection: Condom  Lifestyle  . Physical activity:    Days per week: Not on file    Minutes per session: Not on file  . Stress: Not on file  Relationships  . Social connections:    Talks on phone: Not on file    Gets together: Not on file    Attends religious service: Not on file    Active member of club or organization: Not on file    Attends meetings of clubs or organizations: Not on file    Relationship status: Not on file  Other Topics Concern  . Not on file  Social History Narrative  . Not on file   Family History  Problem Relation Age of Onset  . Asthma Father   . Kidney disease Father   . Eczema Sister    O: Component Value Date/Time   GLUCOSE 124 (H) 09/26/2018 0411   HGBA1C 5.4 09/23/2018 0417   NA 135 09/26/2018 0411   NA 143 04/22/2018 1026   K 4.2 09/26/2018 0411   CL 97 (L) 09/26/2018 0411   CO2 26 09/26/2018 0411   BUN  17 09/26/2018 0411   BUN 19 04/22/2018 1026   CREATININE 2.10 (H) 09/26/2018 0411   CALCIUM 9.2 09/26/2018 0411   GFRNONAA 37 (L) 09/26/2018 0411   GFRAA 43 (L) 09/26/2018 0411   AST 18 09/25/2018 0531   ALT 18 09/25/2018 0531   WBC 6.6 09/25/2018 0531   HGB 10.3 (L) 09/25/2018 0531   HCT 30.8 (L) 09/25/2018 0531   PLT 353 09/25/2018 0531   Ht Readings from Last 2 Encounters:  09/18/18 5\' 9"  (1.753 m)  04/22/18 5\' 9"  (1.753 m)   Wt Readings from Last 2 Encounters:  09/26/18 156 lb 12 oz (71.1 kg)  04/22/18 165 lb 11.2 oz (75.2 kg)   There is no height or weight on file to calculate BMI. BP Readings from Last 3 Encounters:  09/26/18 116/81  04/22/18 119/79  09/15/17 123/78    A/P: medications were reviewed with the patient, including name, instructions, indication, goals of therapy, potential side effects, importance of adherence, and safe use.  Patient reports challenges affording medication. Referred patient to [facility]  medassist pharmacy and provided Xarelto samples today. Advised patient to contact clinic if concerns arise.  Patient verbalized understanding by repeating back information and was provided an information handout.

## 2018-10-12 ENCOUNTER — Encounter: Payer: Self-pay | Admitting: Physician Assistant

## 2018-10-12 NOTE — Progress Notes (Signed)
Cardiology Office Note    Date:  10/13/2018  ID:  Sloan, Joseph 03/10/1971, MRN [id number] PCP:  Delice Bison, DO  Cardiologist:  Larae Grooms, MD   Chief Complaint: cardiac follow-up  History of Present Illness:  [name] is a 47 y.o. male with history of Hypertension, tobacco abuse, asthma, polycystic kidney disease, CKD IV, COPD, recent complex admission for afib, RA thrombus, pulmonary emboli, PFO, DVT, hepatic mass of unclear etiology who presents for hospital follow-up.  On 09/16/18 he was at the pharmacy picking up his medication and suffered a syncopal episode. EMS was called and on arrival the patient was found to be hypotensive and in atrial fib. He stood up and suffered an additional syncopal episode. He had cardioversion x2 in ER without significant improvement in BP or tachycardia. He was giving a liter of fluid. Due to persistent tachycardia he was started on IV Cardizem and heparin. Cardiology was asked to consult. 2D echo showed a large right atrial thrombus with normal LV function. PCCM got involved over concern for needing lytics. VQ confirmed high suspicion of PE but the patient and family declined TPA.Vascular surgery was consulted for possible angiovac removal.The patient was evaluated by Dr. Donzetta Matters who felt patient would require a TEE. This was done which showed a large thrombus in transit straddling a PFO. Imaging also was positive for DVT of L popliteal vein. Cardiothoracic surgery got involved. Cardiac cath was requested which showed no significant CAD. He subsequently underwent a sternotomy, cannulation for cardiopulmonary bypass, right atriotomy for closure of patent foramen ovale, left atrial clip 40 mm device on 09/19/2018. Interestingly the biatrial thrombus was noted to have dissipated with some thrombus noted in the right pulmonary artery. The patient had RV distention and high right-sided pressures with right to left flow across a PFO.  For  that reason CVTS prceeded with closure of PFO to prevent further risk of paradoxical embolus and stroke as the patient previously demonstrated thrombus extending through the PFO into the left atrium in the area of the mitral valve apparatus. Vascular surgery remained on board for concern for acute on chronic ischemia of the left lower extremity but they did not feel any acute intervention was necessary. They recommended to delay further intervention as an outpatient.Post-op he as transitioned from heparin to Xarelto with clot-based dosing. He also was continued on diuretics for short course as outpatient. Last labs showed Cr 2.1 (similar to admission but increased from 04/2018, K 4.2, Hgb 10.3, albumin 2.5, AST/ALT OK. Of note, general imaging during admission showed a hepatic mass for which MRI was suggested.  He returns for post-hospital follow-up today. He has not seen any other specialties thus far. He was unaware of the liver abnormality. He reports overall feeling totally normal except 2 days ago developed frank, dark red urine every single time he goes. Denies CP, SOB, palpitations, dizziness. Sternal wound is sore but healing well. He has begun occasionally driving because he states he has no other choice. I believe he needs to go to the ER to have his hematuria evaluated given his complex PMH and inability to stop his blood thinner. He also has advanced kidney disease and anemia at recent baseline which increases risk of clinically significant bleeding. The holiday limits options for outpatient management and this should be addressed without delay.  Past Medical History:  Diagnosis Date  . Asthma   . CKD (chronic kidney disease), stage IV ([place])   . DVT (deep venous thrombosis) ([place])  08/2018  . Eczema herpeticum   . Erythroderma   . Erythroderma   . Hypertension   . PAF (paroxysmal atrial fibrillation) ([place])   . PFO (patent foramen ovale)   . Polycystic kidney disease   . Pulmonary emboli  ([place])   . Syncope   . 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  . Wears glasses     Past Surgical History:  Procedure Laterality Date  . CLIPPING OF ATRIAL APPENDAGE  09/19/2018   Procedure: CLIPPING OF LEFT ATRIAL APPENDAGE;  Surgeon: Ivin Poot, MD;  Location: [facility];  Service: Open Heart Surgery;;  . [place]  . HERNIA REPAIR    . INTRAOPERATIVE TRANSESOPHAGEAL ECHOCARDIOGRAM N/A 09/19/2018   Procedure: INTRAOPERATIVE TRANSESOPHAGEAL ECHOCARDIOGRAM;  Surgeon: Ivin Poot, MD;  Location: [facility];  Service: Open Heart Surgery;  Laterality: N/A;  . LEFT HEART CATH AND CORONARY ANGIOGRAPHY N/A 09/18/2018   Procedure: LEFT HEART CATH AND CORONARY ANGIOGRAPHY;  Surgeon: Jettie Booze, MD;  Location: [facility];  Service: Cardiovascular;  Laterality: N/A;  . REPAIR OF PATENT FORAMEN OVALE N/A 09/19/2018   Procedure: CLOSURE OF PATENT FORAMEN OVALE;  Surgeon: Ivin Poot, MD;  Location: [facility];  Service: Open Heart Surgery;  Laterality: N/A;  . TEE WITHOUT CARDIOVERSION N/A 09/18/2018   Procedure: TRANSESOPHAGEAL ECHOCARDIOGRAM (TEE);  Surgeon: Sanda Klein, MD;  Location: [facility];  Service: Cardiovascular;  Laterality: N/A;    Current Medications: Current Meds  Medication Sig  . albuterol (PROVENTIL HFA;VENTOLIN HFA) 108 (90 BASE) MCG/ACT inhaler Inhale 1-2 puffs into the lungs every 6 (six) hours as needed for wheezing or shortness of breath.  . carvedilol (COREG) 6.25 MG tablet Take 1 tablet (6.25 mg total) by mouth 2 (two) times daily with a meal.  . Dupilumab (DUPIXENT [place]) Inject 1 Syringe into the skin every 14 (fourteen) days.  . Rivaroxaban 15 & 20 MG TBPK Start with one 15mg  tablet by mouth twice a day with food. On Day 22, switch to one 20mg  tablet once a day with food.      Allergies:   Lisinopril   Social History   Socioeconomic History  . Marital status: Married    Spouse name: Not on file  . Number of children: Not on  file  . Years of education: Not on file  . Highest education level: Not on file  Occupational History  . Not on file  Social Needs  . Financial resource strain: Not on file  . Food insecurity:    Worry: Not on file    Inability: Not on file  . Transportation needs:    Medical: Not on file    Non-medical: Not on file  Tobacco Use  . Smoking status: Current Every Day Smoker    Packs/day: 1.00  . Smokeless tobacco: Never Used  Substance and Sexual Activity  . Alcohol use: Yes    Comment: OCCASIONALLY  . Drug use: No  . Sexual activity: Yes    Birth control/protection: Condom  Lifestyle  . Physical activity:    Days per week: Not on file    Minutes per session: Not on file  . Stress: Not on file  Relationships  . Social connections:    Talks on phone: Not on file    Gets together: Not on file    Attends religious service: Not on file    Active member of club or organization: Not on file  Attends meetings of clubs or organizations: Not on file    Relationship status: Not on file  Other Topics Concern  . Not on file  Social History Narrative  . Not on file     Family History:  The patient's family history includes Asthma in his father; Eczema in his sister; Kidney disease in his father.  ROS:   Please see the history of present illness. All other systems are reviewed and otherwise negative.    PHYSICAL EXAM:   VS:  BP 132/78   Pulse 80   Ht 5\' 9"  (1.753 m)   Wt 166 lb 6.4 oz (75.5 kg)   SpO2 98%   BMI 24.57 kg/m   BMI: Body mass index is 24.57 kg/m. GEN: Well nourished, well developed AAM, in no acute distress HEENT: normocephalic, atraumatic Neck: no JVD, carotid bruits, or masses Cardiac: RRR; no murmurs, rubs, or gallops, no edema  Respiratory:  clear to auscultation bilaterally, normal work of breathing GI: soft, nontender, nondistended, + BS MS: no deformity or atrophy Skin: warm and dry, no rash Neuro:  Alert and Oriented x 3, Strength and  sensation are intact, follows commands Psych: euthymic mood, full affect  Wt Readings from Last 3 Encounters:  10/13/18 166 lb 6.4 oz (75.5 kg)  09/26/18 156 lb 12 oz (71.1 kg)  04/22/18 165 lb 11.2 oz (75.2 kg)      Studies/Labs Reviewed:   EKG:  EKG was ordered today and personally reviewed by me and demonstrates NSR 78bpm, nonspecific TW changes  Recent Labs: 09/21/2018: Magnesium 2.3 09/25/2018: ALT 18; Hemoglobin 10.3; Platelets 353 09/26/2018: BUN 17; Creatinine, Ser 2.10; Potassium 4.2; Sodium 135   Lipid Panel No results found for: CHOL, TRIG, HDL, CHOLHDL, VLDL, LDLCALC, LDLDIRECT  Additional studies/ records that were reviewed today include: Summarized above   ASSESSMENT & PLAN:   1. 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 There were no clear provoking mechanisms for his VTE so he will likely need lifelong anticoagulation - long-term this will need to be followed by primary care as well. He also has an undefined liver mass that requires further workup. We discussed importance of early follow-up with primary care for these loose ends. I explained to him that abnormal bleeding such as hematuria, melena, hematemesis or BRBPR are not normal on blood thinners and in the future he needs to notify his medical team immediately. Our office is closing momentarily and will be closed through Thursday. The problem is ongoing. Given his complex admission and need for ongoing anticoagulation I believe the hematuria needs to be evaluated acutely and have advised he proceed to ER. He declined any and all forms of transportation but agrees to proceed otherwise. 2. Paroxysmal atrial fibrillation - likely due to stress of #1. Continue anticoagulation for now and observe for recurrence. Continue carvedilol. 3. PFO closure - has f/u as scheduled 1/15 with  CVTS. We discussed importance of avoiding driving and excessive activity until cleared by surgeon but he does not appear to be able to comply with this. As such we discussed increased risk of post-op complications. 4. CKD IV - recommend f/u with primary care for this. Suggest this be repeated with ED visit today. 5. Hematuria - as above.  Disposition: F/u with Dr. Irish Lack (preferably) or care team APP in 6 weeks.  Medication Adjustments/Labs and Tests  Ordered: Current medicines are reviewed at length with the patient today.  Concerns regarding medicines are outlined above. Medication changes, Labs and Tests ordered today are summarized above and listed in the Patient Instructions accessible in Encounters.   Signed, Charlie Pitter, PA-C  10/13/2018 11:30 AM    [address], [place], [place]  [address] Phone: [phone]; Fax: [phone]

## 2018-10-13 ENCOUNTER — Telehealth: Payer: Self-pay | Admitting: Physician Assistant

## 2018-10-13 ENCOUNTER — Emergency Department (HOSPITAL_COMMUNITY)
Admission: EM | Admit: 2018-10-13 | Discharge: 2018-10-13 | Disposition: A | Discharge status: Home / Self Care | Payer: Self-pay | Attending: Emergency Medicine | Admitting: Emergency Medicine

## 2018-10-13 ENCOUNTER — Encounter: Payer: Self-pay | Admitting: Physician Assistant

## 2018-10-13 ENCOUNTER — Encounter (HOSPITAL_COMMUNITY): Payer: Self-pay | Admitting: *Deleted

## 2018-10-13 ENCOUNTER — Ambulatory Visit (INDEPENDENT_AMBULATORY_CARE_PROVIDER_SITE_OTHER): Payer: Self-pay | Admitting: Physician Assistant

## 2018-10-13 ENCOUNTER — Other Ambulatory Visit: Payer: Self-pay

## 2018-10-13 VITALS — BP 132/78 | HR 80 | Ht 69.0 in | Wt 166.4 lb

## 2018-10-13 DIAGNOSIS — N3091 Cystitis, unspecified with hematuria: Secondary | ICD-10-CM | POA: Insufficient documentation

## 2018-10-13 DIAGNOSIS — I480 Paroxysmal atrial fibrillation: Secondary | ICD-10-CM | POA: Insufficient documentation

## 2018-10-13 DIAGNOSIS — Q2112 Patent foramen ovale: Secondary | ICD-10-CM

## 2018-10-13 DIAGNOSIS — I8290 Acute embolism and thrombosis of unspecified vein: Secondary | ICD-10-CM

## 2018-10-13 DIAGNOSIS — R55 Syncope and collapse: Secondary | ICD-10-CM

## 2018-10-13 DIAGNOSIS — Z86711 Personal history of pulmonary embolism: Secondary | ICD-10-CM

## 2018-10-13 DIAGNOSIS — Z86718 Personal history of other venous thrombosis and embolism: Secondary | ICD-10-CM | POA: Insufficient documentation

## 2018-10-13 DIAGNOSIS — F17200 Nicotine dependence, unspecified, uncomplicated: Secondary | ICD-10-CM | POA: Insufficient documentation

## 2018-10-13 DIAGNOSIS — Q211 Atrial septal defect: Secondary | ICD-10-CM

## 2018-10-13 DIAGNOSIS — N184 Chronic kidney disease, stage 4 (severe): Secondary | ICD-10-CM

## 2018-10-13 DIAGNOSIS — R319 Hematuria, unspecified: Secondary | ICD-10-CM

## 2018-10-13 DIAGNOSIS — I129 Hypertensive chronic kidney disease with stage 1 through stage 4 chronic kidney disease, or unspecified chronic kidney disease: Secondary | ICD-10-CM | POA: Insufficient documentation

## 2018-10-13 DIAGNOSIS — N3090 Cystitis, unspecified without hematuria: Secondary | ICD-10-CM

## 2018-10-13 LAB — CBC WITH DIFFERENTIAL/PLATELET
Abs Immature Granulocytes: 0.01 10*3/uL (ref 0.00–0.07)
Basophils Absolute: 0.1 10*3/uL (ref 0.0–0.1)
Basophils Relative: 1 %
EOS PCT: 9 %
Eosinophils Absolute: 0.5 10*3/uL (ref 0.0–0.5)
HCT: 32.8 % — ABNORMAL LOW (ref 39.0–52.0)
Hemoglobin: 10.7 g/dL — ABNORMAL LOW (ref 13.0–17.0)
Immature Granulocytes: 0 %
Lymphocytes Relative: 22 %
Lymphs Abs: 1.1 10*3/uL (ref 0.7–4.0)
MCH: 33.1 pg (ref 26.0–34.0)
MCHC: 32.6 g/dL (ref 30.0–36.0)
MCV: 101.5 fL — AB (ref 80.0–100.0)
MONO ABS: 0.4 10*3/uL (ref 0.1–1.0)
MONOS PCT: 8 %
Neutro Abs: 3 10*3/uL (ref 1.7–7.7)
Neutrophils Relative %: 60 %
Platelets: 288 10*3/uL (ref 150–400)
RBC: 3.23 MIL/uL — ABNORMAL LOW (ref 4.22–5.81)
RDW: 12.2 % (ref 11.5–15.5)
WBC: 5.1 10*3/uL (ref 4.0–10.5)
nRBC: 0 % (ref 0.0–0.2)

## 2018-10-13 LAB — BASIC METABOLIC PANEL
Anion gap: 8 (ref 5–15)
BUN: 18 mg/dL (ref 6–20)
CHLORIDE: 111 mmol/L (ref 98–111)
CO2: 24 mmol/L (ref 22–32)
Calcium: 8.8 mg/dL — ABNORMAL LOW (ref 8.9–10.3)
Creatinine, Ser: 1.71 mg/dL — ABNORMAL HIGH (ref 0.61–1.24)
GFR calc Af Amer: 55 mL/min — ABNORMAL LOW (ref >60)
GFR calc non Af Amer: 47 mL/min — ABNORMAL LOW (ref >60)
Glucose, Bld: 96 mg/dL (ref 70–99)
Potassium: 3.5 mmol/L (ref 3.5–5.1)
Sodium: 143 mmol/L (ref 135–145)

## 2018-10-13 LAB — URINALYSIS, ROUTINE W REFLEX MICROSCOPIC
GLUCOSE, UA: 100 mg/dL — AB
KETONES UR: 15 mg/dL — AB
Nitrite: POSITIVE — AB
Protein, ur: >300 mg/dL — AB
Specific Gravity, Urine: 1.02 (ref 1.005–1.030)
pH: 6.5 (ref 5.0–8.0)

## 2018-10-13 LAB — URINALYSIS, MICROSCOPIC (REFLEX): RBC / HPF: >50 RBC/hpf (ref 0–5)

## 2018-10-13 MED ORDER — CEPHALEXIN 500 MG PO CAPS: 500.0000 mg | ORAL_CAPSULE | Freq: Three times a day (TID) | ORAL | 0 refills | Status: DC

## 2018-10-13 MED FILL — CEPHALEXIN 500 MG CAPSULE: 500 | 10 days supply | Qty: 30 | Fill #0

## 2018-10-13 NOTE — ED Notes (Signed)
Patient verbalized understanding of discharge instructions and denies any further needs or questions at this time. VS stable. Patient ambulatory with steady gait.  

## 2018-10-13 NOTE — Telephone Encounter (Addendum)
I think I accidentally put to f/u Dr. [place] in 6 weeks. There was some confusion on primary cardiologist. It should be Dr. Irish Lack or care team APP. Can you help fix? Thanks. Claudio Mondry PA-C

## 2018-10-13 NOTE — ED Triage Notes (Signed)
Pt states bloody urine since yesterday.  Denies pain.  Open heart surgery  Nov 30th.

## 2018-10-13 NOTE — Telephone Encounter (Signed)
Called pt and he has been made aware that he will come back and see Melina Copa, PA-C, 11/05/18, not Almyra Deforest, PA-C, that it was scheduled on the incorrect providers schedule by mistake. He verbalized understanding.

## 2018-10-13 NOTE — ED Provider Notes (Signed)
[facility] EMERGENCY DEPARTMENT Provider Note   CSN: [id number] Arrival date & time: 10/13/18  1250     History   Chief Complaint Chief Complaint  Patient presents with  . Hematuria    HPI [name] is a 47 y.o. male.  This is a 47 year old male with history of chronic kidney disease who presents with hematuria x2 days.  Patient states he had no pain or distress from his penis.  No fever chills.  No flank pain no suprapubic discomfort.  Patient does use blood thinners due to history of A. fib.  Went to his cardiologist today for follow-up appointment and was told to come here for further management.  Patient denies any prior history of gross hematuria.  No history of kidney stones.     Past Medical History:  Diagnosis Date  . Asthma   . CKD (chronic kidney disease), stage IV ([place])   . DVT (deep venous thrombosis) ([place]) 08/2018  . Eczema herpeticum   . Erythroderma   . Erythroderma   . Hypertension   . PAF (paroxysmal atrial fibrillation) ([place])   . PFO (patent foramen ovale)   . Polycystic kidney disease   . Pulmonary emboli ([place])   . Syncope   . 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  . Wears glasses     Patient Active Problem List   Diagnosis Date Noted  . Atrial fibrillation with rapid ventricular response ([place])   . Surgery follow-up   . PFO (patent foramen ovale) 09/19/2018  . Right atrial mass   . Hypotension   . Syncope   . PE (pulmonary thromboembolism) ([place])   . Clot   . Polycystic kidney disease 04/22/2018  . Eczema herpeticum 04/22/2018  . Essential hypertension 04/22/2018  . Tonsillar abscess 12/25/2012    Class: Acute    Past Surgical History:  Procedure Laterality Date  . CLIPPING OF ATRIAL APPENDAGE  09/19/2018   Procedure: CLIPPING OF LEFT ATRIAL APPENDAGE;  Surgeon: Ivin Poot, MD;  Location: [facility];  Service: Open Heart Surgery;;  . [place]  . HERNIA REPAIR    . INTRAOPERATIVE  TRANSESOPHAGEAL ECHOCARDIOGRAM N/A 09/19/2018   Procedure: INTRAOPERATIVE TRANSESOPHAGEAL ECHOCARDIOGRAM;  Surgeon: Ivin Poot, MD;  Location: [facility];  Service: Open Heart Surgery;  Laterality: N/A;  . LEFT HEART CATH AND CORONARY ANGIOGRAPHY N/A 09/18/2018   Procedure: LEFT HEART CATH AND CORONARY ANGIOGRAPHY;  Surgeon: Jettie Booze, MD;  Location: [facility] CV LAB;  Service: Cardiovascular;  Laterality: N/A;  . REPAIR OF PATENT FORAMEN OVALE N/A 09/19/2018   Procedure: CLOSURE OF PATENT FORAMEN OVALE;  Surgeon: Ivin Poot, MD;  Location: [facility];  Service: Open Heart Surgery;  Laterality: N/A;  . TEE WITHOUT CARDIOVERSION N/A 09/18/2018   Procedure: TRANSESOPHAGEAL ECHOCARDIOGRAM (TEE);  Surgeon: Sanda Klein, MD;  Location: [facility] ENDOSCOPY;  Service: Cardiovascular;  Laterality: N/A;        Home Medications    Prior to Admission medications   Medication Sig Start Date End Date Taking? Authorizing Provider  albuterol (PROVENTIL HFA;VENTOLIN HFA) 108 (90 BASE) MCG/ACT inhaler Inhale 1-2 puffs into the lungs every 6 (six) hours as needed for wheezing or shortness of breath. 12/22/12   Billy Fischer, MD  carvedilol (COREG) 6.25 MG tablet Take 1 tablet (6.25 mg total) by mouth 2 (two) times daily with a meal. 09/25/18   Lars Pinks M, PA-C  Dupilumab (DUPIXENT [place]) Inject 1 Syringe into the  skin every 14 (fourteen) days.    [provider]  Rivaroxaban 15 & 20 MG TBPK Start with one 15mg  tablet by mouth twice a day with food. On Day 22, switch to one 20mg  tablet once a day with food. 09/25/18   Ivin Poot, MD    Family History Family History  Problem Relation Age of Onset  . Asthma Father   . Kidney disease Father   . Eczema Sister     Social History Social History   Tobacco Use  . Smoking status: Current Every Day Smoker    Packs/day: 0.50  . Smokeless tobacco: Never Used  Substance Use Topics  . Alcohol use: Yes    Comment: OCCASIONALLY    . Drug use: No     Allergies   Lisinopril   Review of Systems Review of Systems  All other systems reviewed and are negative.    Physical Exam Updated Vital Signs Ht 1.753 m (5\' 9" )   Wt 75 kg   BMI 24.42 kg/m   Physical Exam Vitals signs and nursing note reviewed.  Constitutional:      General: He is not in acute distress.    Appearance: Normal appearance. He is well-developed. He is not toxic-appearing.  HENT:     Head: Normocephalic and atraumatic.  Eyes:     General: Lids are normal.     Conjunctiva/sclera: Conjunctivae normal.     Pupils: Pupils are equal, round, and reactive to light.  Neck:     Musculoskeletal: Normal range of motion and neck supple.     Thyroid: No thyroid mass.     Trachea: No tracheal deviation.  Cardiovascular:     Rate and Rhythm: Normal rate and regular rhythm.     Heart sounds: Normal heart sounds. No murmur. No gallop.   Pulmonary:     Effort: Pulmonary effort is normal. No respiratory distress.     Breath sounds: Normal breath sounds. No stridor. No decreased breath sounds, wheezing, rhonchi or rales.  Abdominal:     General: Bowel sounds are normal. There is no distension.     Palpations: Abdomen is soft.     Tenderness: There is no abdominal tenderness. There is no rebound.  Musculoskeletal: Normal range of motion.        General: No tenderness.  Skin:    General: Skin is warm and dry.     Findings: No abrasion or rash.  Neurological:     Mental Status: He is alert and oriented to person, place, and time.     GCS: GCS eye subscore is 4. GCS verbal subscore is 5. GCS motor subscore is 6.     Cranial Nerves: No cranial nerve deficit.     Sensory: No sensory deficit.  Psychiatric:        Speech: Speech normal.        Behavior: Behavior normal.      ED Treatments / Results  Labs (all labs ordered are listed, but only abnormal results are displayed) Labs Reviewed  URINE CULTURE  URINALYSIS, ROUTINE W REFLEX  MICROSCOPIC  CBC WITH DIFFERENTIAL/PLATELET  BASIC METABOLIC PANEL    EKG None  Radiology No results found.  Procedures Procedures (including critical care time)  Medications Ordered in ED Medications - No data to display   Initial Impression / Assessment and Plan / ED Course  I have reviewed the triage vital signs and the nursing notes.  Pertinent labs & imaging results that were available during my  care of the patient were reviewed by me and considered in my medical decision making (see chart for details).     Patient with likely cystitis.  Urine culture has been sent.  Will start on Keflex and he will follow-up with his doctor.  Renal function within his normal limits  Final Clinical Impressions(s) / ED Diagnoses   Final diagnoses:  None    ED Discharge Orders    None       Lacretia Leigh, MD 10/13/18 1448

## 2018-10-13 NOTE — Patient Instructions (Addendum)
Medication Instructions:  Your physician recommends that you continue on your current medications as directed. Please refer to the Current Medication list given to you today.  If you need a refill on your cardiac medications before your next appointment, please call your pharmacy.   Lab work: None ordered  If you have labs (blood work) drawn today and your tests are completely normal, you will receive your results only by: Marland Kitchen MyChart Message (if you have MyChart) OR . A paper copy in the mail If you have any lab test that is abnormal or we need to change your treatment, we will call you to review the results.  Testing/Procedures: None ordered   Your physician recommends that you schedule a follow-up appointment in: 6 WEEKS WITH DR. Martinique OR HIS CARE TEAM APP  Additional Instructions:  GO TO THE EMERGENCY ROOM FOR THE BLOOD IN YOUR URINE  FOLLOW UP WITH YOUR PRIMARY CARE PHYSICIAN ASAP FOR CLOTS AND TO FURTHER EVALUATE THE LIVER LESION

## 2018-10-14 LAB — URINE CULTURE: CULTURE: NO GROWTH

## 2018-10-28 ENCOUNTER — Other Ambulatory Visit: Payer: Self-pay | Admitting: Pharmacist

## 2018-10-28 DIAGNOSIS — I10 Essential (primary) hypertension: Secondary | ICD-10-CM

## 2018-10-28 MED ORDER — RIVAROXABAN 20 MG PO TABS: 20.0000 mg | ORAL_TABLET | Freq: Every day | ORAL | 0 refills | Status: DC

## 2018-10-28 MED ORDER — CARVEDILOL 6.25 MG PO TABS: 6.2500 mg | ORAL_TABLET | Freq: Two times a day (BID) | ORAL | 0 refills | Status: DC

## 2018-10-28 NOTE — Progress Notes (Signed)
Patient was approved for free medication access through [facility] pharmacy

## 2018-11-02 NOTE — Progress Notes (Signed)
Cardiology Office Note    Date:  11/05/2018  ID:  [name], DOB Aug 06, 1971, MRN [id number] PCP:  Delice Bison, DO  Cardiologist:  Larae Grooms, MD   Chief Complaint: cardiac f/u of thrombus, afib  History of Present Illness:  [name] is a 48 y.o. male with history of hypertension, tobacco abuse, asthma, polycystic kidney disease, CKD IV, COPD, recent complex admission for afib, RA thrombus, pulmonary emboli, PFO, DVT, hepatic mass of unclear etiology who presents for follow-up.  On 09/16/18 he was at the pharmacy picking up his medication and suffered a syncopal episode. EMS was called and on arrival the patient was found to be hypotensive and in atrial fib. He stood up and suffered an additional syncopal episode. He had cardioversion x2 in ER without significant improvement in BP or tachycardia. He was giving a liter of fluid. Due to persistent tachycardia he was started on IV Cardizem and heparin. Cardiology was asked to consult and recommended ED pursue eval for PE. 2D echo showed a large right atrial thrombus with normal LV function. PCCM got involved over concern for needing lytics. VQ confirmed high suspicion of PE but the patient & family declined TPA.Vascular surgery was consulted for possible angiovac removal.The patient was evaluated by Dr. Donzetta Matters who felt patient would require a TEE. This was done which showed a large thrombus in transit straddling a PFO. Imaging also was positive for DVT of L popliteal vein. Cardiothoracic surgery got involved. Cardiac cath was requested which showed no significant CAD. He subsequently underwent a sternotomy, cannulation for cardiopulmonary bypass, right atriotomy for closure of patent foramen ovale, left atrial clip 40 mm device on 09/19/2018. Interestingly the biatrial thrombus was noted to have dissipated with some thrombus noted in the right pulmonary artery. The patient had RV distention and high right-sided pressures with  right to left flow across a PFO.  For that reason CVTS proceeded with closure of PFO to prevent further risk of paradoxical embolus and stroke as the patient previously demonstrated thrombus extending through the PFO into the left atrium in the area of the mitral valve apparatus. Vascular surgery remained on board for concern for acute on chronic ischemia of the left lower extremity but they did not feel any acute intervention was necessary. They recommended to delay further intervention as an outpatient. Post-op he transitioned from heparin to Xarelto with clot-based dosing. He also was continued on diuretics for short course as outpatient. Last labs showed Cr 2.1 (similar to admission but increased from 04/2018, K 4.2, Hgb 10.3, albumin 2.5, AST/ALT OK. Of note, general imaging during admission showed a hepatic mass for which MRI was suggested. I saw him back in follow-up 10/13/18 and informed him of this finding; he was unaware of this - I asked him to discuss next steps with PCP ASAP. He was stable from a cardiac standpoint but otherwise reporting gross hematuria so was sent to the ED. They treated him for UTI. Hgb was stable at 10.7 and Cr had improved to 1.71. Urine culture showed no growth.   He returns for f/u doing well. Hematuria resolved with treatment of UTI. No CP, SOB, palpitations, syncope, edema, leg pain, dizziness. Tolerating all meds well. Continues on Xarelto. Saw Dr. Prescott Gum yesterday and felt to be doing well.   Past Medical History:  Diagnosis Date  . Asthma   . CKD (chronic kidney disease), stage IV ([place])   . DVT (deep venous thrombosis) ([place]) 08/2018  . Eczema herpeticum   .  Erythroderma   . Erythroderma   . Hypertension   . PAF (paroxysmal atrial fibrillation) ([place])   . PFO (patent foramen ovale)   . Polycystic kidney disease   . Pulmonary emboli ([place])   . Syncope   . 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  . Wears glasses     Past Surgical History:    Procedure Laterality Date  . CLIPPING OF ATRIAL APPENDAGE  09/19/2018   Procedure: CLIPPING OF LEFT ATRIAL APPENDAGE;  Surgeon: Ivin Poot, MD;  Location: [facility];  Service: Open Heart Surgery;;  . [place]  . HERNIA REPAIR    . INTRAOPERATIVE TRANSESOPHAGEAL ECHOCARDIOGRAM N/A 09/19/2018   Procedure: INTRAOPERATIVE TRANSESOPHAGEAL ECHOCARDIOGRAM;  Surgeon: Ivin Poot, MD;  Location: [facility];  Service: Open Heart Surgery;  Laterality: N/A;  . LEFT HEART CATH AND CORONARY ANGIOGRAPHY N/A 09/18/2018   Procedure: LEFT HEART CATH AND CORONARY ANGIOGRAPHY;  Surgeon: Jettie Booze, MD;  Location: [facility] CV LAB;  Service: Cardiovascular;  Laterality: N/A;  . REPAIR OF PATENT FORAMEN OVALE N/A 09/19/2018   Procedure: CLOSURE OF PATENT FORAMEN OVALE;  Surgeon: Ivin Poot, MD;  Location: [facility];  Service: Open Heart Surgery;  Laterality: N/A;  . TEE WITHOUT CARDIOVERSION N/A 09/18/2018   Procedure: TRANSESOPHAGEAL ECHOCARDIOGRAM (TEE);  Surgeon: Sanda Klein, MD;  Location: [facility] ENDOSCOPY;  Service: Cardiovascular;  Laterality: N/A;    Current Medications: Current Meds  Medication Sig  . albuterol (PROVENTIL HFA;VENTOLIN HFA) 108 (90 BASE) MCG/ACT inhaler Inhale 1-2 puffs into the lungs every 6 (six) hours as needed for wheezing or shortness of breath.  . carvedilol (COREG) 6.25 MG tablet Take 1 tablet (6.25 mg total) by mouth 2 (two) times daily with a meal.  . cephALEXin (KEFLEX) 500 MG capsule Take 1 capsule (500 mg total) by mouth 3 (three) times daily.  . Dupilumab (DUPIXENT [place]) Inject 1 Syringe into the skin every 14 (fourteen) days.  . rivaroxaban (XARELTO) 20 MG TABS tablet Take 1 tablet (20 mg total) by mouth daily with supper.  . Rivaroxaban 15 & 20 MG TBPK Start with one 15mg  tablet by mouth twice a day with food. On Day 22, switch to one 20mg  tablet once a day with food.      Allergies:   Lisinopril   Social History   Socioeconomic History  .  Marital status: Married    Spouse name: Not on file  . Number of children: Not on file  . Years of education: Not on file  . Highest education level: Not on file  Occupational History  . Not on file  Social Needs  . Financial resource strain: Not on file  . Food insecurity:    Worry: Not on file    Inability: Not on file  . Transportation needs:    Medical: Not on file    Non-medical: Not on file  Tobacco Use  . Smoking status: Current Every Day Smoker    Packs/day: 0.50  . Smokeless tobacco: Never Used  Substance and Sexual Activity  . Alcohol use: Yes    Comment: OCCASIONALLY  . Drug use: No  . Sexual activity: Yes    Birth control/protection: Condom  Lifestyle  . Physical activity:    Days per week: Not on file    Minutes per session: Not on file  . Stress: Not on file  Relationships  . Social connections:    Talks on phone: Not on file  Gets together: Not on file    Attends religious service: Not on file    Active member of club or organization: Not on file    Attends meetings of clubs or organizations: Not on file    Relationship status: Not on file  Other Topics Concern  . Not on file  Social History Narrative  . Not on file     Family History:  The patient's family history includes Asthma in his father; Eczema in his sister; Kidney disease in his father.  ROS:   Please see the history of present illness.  All other systems are reviewed and otherwise negative.    PHYSICAL EXAM:   VS:  BP 130/80   Pulse 62   Ht 5\' 9"  (1.753 m)   Wt 165 lb 6.4 oz (75 kg)   SpO2 98%   BMI 24.43 kg/m   BMI: Body mass index is 24.43 kg/m. GEN: Well nourished, well developed AAM in no acute distress HEENT: normocephalic, atraumatic Neck: no JVD, carotid bruits, or masses Cardiac: RRR; no murmurs, rubs, or gallops, no edema  Respiratory:  clear to auscultation bilaterally, normal work of breathing GI: soft, nontender, nondistended, + BS MS: no deformity or  atrophy Skin: warm and dry, no rash Neuro:  Alert and Oriented x 3, Strength and sensation are intact, follows commands Psych: euthymic mood, full affect  Wt Readings from Last 3 Encounters:  11/05/18 165 lb 6.4 oz (75 kg)  11/04/18 165 lb 9.6 oz (75.1 kg)  10/13/18 165 lb 5.5 oz (75 kg)      Studies/Labs Reviewed:   EKG: EKG was not ordered today.  Recent Labs: 09/21/2018: Magnesium 2.3 09/25/2018: ALT 18 10/13/2018: BUN 18; Creatinine, Ser 1.71; Hemoglobin 10.7; Platelets 288; Potassium 3.5; Sodium 143   Lipid Panel No results found for: CHOL, TRIG, HDL, CHOLHDL, VLDL, LDLCALC, LDLDIRECT  Additional studies/ records that were reviewed today include: Summarized above   ASSESSMENT & PLAN:   1. 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 There were no clear provoking mechanisms for his VTE so may need lifelong therapy. Dr Prescott Gum outlined that this should be continued for 1 year. Ultimately I would leave this decision up to primary care to follow as he also has an undefined liver mass that requires further workup. It's unclear what relationship this has to his VTE. 2. PAF - likely due to acute physiologic stress of #1. Did not cause his syncope (rarely dose). Moreso a sequelae of PE. No recurrence. Continue to monitor. Continue carvedilol, Xarelto. 3. PFO closure - per discussion with Dr. Irish Lack, will obtain f/u echo to evaluate. 4. CKD IV - this is followed by Dr. Moshe Cipro. 5. Hematuria - resolved.  Disposition: F/u with Dr. Irish Lack in 4-5 months. I also stressed importance of f/u with PCP for liver abnormality - long term his anemia also needs to be monitored as well.  Medication Adjustments/Labs and Tests Ordered: Current medicines are reviewed at length with the patient today.  Concerns regarding medicines are  outlined above. Medication changes, Labs and Tests ordered today are summarized above and listed in the Patient Instructions accessible in Encounters.   Signed, Charlie Pitter, PA-C  11/05/2018 9:10 AM    [facility] [address], [place], [place]  [address] Phone: [phone]; Fax: [phone]  

## 2018-11-03 ENCOUNTER — Other Ambulatory Visit: Payer: Self-pay | Admitting: Cardiothoracic Surgery

## 2018-11-03 DIAGNOSIS — I5189 Other ill-defined heart diseases: Secondary | ICD-10-CM

## 2018-11-04 ENCOUNTER — Encounter: Payer: Self-pay | Admitting: Cardiothoracic Surgery

## 2018-11-04 ENCOUNTER — Other Ambulatory Visit: Payer: Self-pay

## 2018-11-04 ENCOUNTER — Ambulatory Visit (INDEPENDENT_AMBULATORY_CARE_PROVIDER_SITE_OTHER): Payer: Self-pay | Admitting: Cardiothoracic Surgery

## 2018-11-04 ENCOUNTER — Ambulatory Visit
Admission: RE | Admit: 2018-11-04 | Discharge: 2018-11-04 | Disposition: A | Discharge status: Home / Self Care | Payer: Self-pay | Source: Ambulatory Visit | Attending: Cardiothoracic Surgery | Admitting: Cardiothoracic Surgery

## 2018-11-04 VITALS — BP 134/94 | HR 68 | Resp 18 | Ht 69.0 in | Wt 165.6 lb

## 2018-11-04 DIAGNOSIS — Q211 Atrial septal defect: Secondary | ICD-10-CM

## 2018-11-04 DIAGNOSIS — I5189 Other ill-defined heart diseases: Secondary | ICD-10-CM

## 2018-11-04 DIAGNOSIS — Z09 Encounter for follow-up examination after completed treatment for conditions other than malignant neoplasm: Secondary | ICD-10-CM

## 2018-11-04 DIAGNOSIS — Q2112 Patent foramen ovale: Secondary | ICD-10-CM

## 2018-11-04 NOTE — Progress Notes (Signed)
PCP is Delice Bison, DO Referring Provider is Jettie Booze, MD  Chief Complaint  Patient presents with  . Routine Post Op    f/u from surgery with CXR s/p Sternotomy, cannulation for cardiopulmonary bypass, right atriotomy for closure of patent foramen ovale, left atrial clip 09/19/18    HPI: 48 year old male returns for scheduled office visit for postoperative evaluation.  He underwent closure of a large PFO and left atrial clip on September 19, 2018 after presenting with DVT, pulmonary emboli, and a interatrial thrombus through the PFO.  Postoperatively he had RV dysfunction, atrial fibrillation, and pleural effusions.  He was discharged home on Xarelto which he will need to take for 1 year.  Today he returns feeling much better.  He is in sinus rhythm.  He has no evidence of right heart failure, and his chest x-ray shows clear lung fields no pleural effusion.  The patient smokes 4 to 5 cigarettes/day.  He denies shortness of breath chest pain or surgical incisional pain.   Past Medical History:  Diagnosis Date  . Asthma   . CKD (chronic kidney disease), stage IV ([place])   . DVT (deep venous thrombosis) ([place]) 08/2018  . Eczema herpeticum   . Erythroderma   . Erythroderma   . Hypertension   . PAF (paroxysmal atrial fibrillation) ([place])   . PFO (patent foramen ovale)   . Polycystic kidney disease   . Pulmonary emboli ([place])   . Syncope   . 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  . Wears glasses     Past Surgical History:  Procedure Laterality Date  . CLIPPING OF ATRIAL APPENDAGE  09/19/2018   Procedure: CLIPPING OF LEFT ATRIAL APPENDAGE;  Surgeon: Ivin Poot, MD;  Location: [facility];  Service: Open Heart Surgery;;  . [place]  . HERNIA REPAIR    . INTRAOPERATIVE TRANSESOPHAGEAL ECHOCARDIOGRAM N/A 09/19/2018   Procedure: INTRAOPERATIVE TRANSESOPHAGEAL ECHOCARDIOGRAM;  Surgeon: Ivin Poot, MD;  Location: [facility];  Service: Open Heart Surgery;   Laterality: N/A;  . LEFT HEART CATH AND CORONARY ANGIOGRAPHY N/A 09/18/2018   Procedure: LEFT HEART CATH AND CORONARY ANGIOGRAPHY;  Surgeon: Jettie Booze, MD;  Location: [facility] CV LAB;  Service: Cardiovascular;  Laterality: N/A;  . REPAIR OF PATENT FORAMEN OVALE N/A 09/19/2018   Procedure: CLOSURE OF PATENT FORAMEN OVALE;  Surgeon: Ivin Poot, MD;  Location: [facility];  Service: Open Heart Surgery;  Laterality: N/A;  . TEE WITHOUT CARDIOVERSION N/A 09/18/2018   Procedure: TRANSESOPHAGEAL ECHOCARDIOGRAM (TEE);  Surgeon: Sanda Klein, MD;  Location: [facility] ENDOSCOPY;  Service: Cardiovascular;  Laterality: N/A;    Family History  Problem Relation Age of Onset  . Asthma Father   . Kidney disease Father   . Eczema Sister     Social History Social History   Tobacco Use  . Smoking status: Current Every Day Smoker    Packs/day: 0.50  . Smokeless tobacco: Never Used  Substance Use Topics  . Alcohol use: Yes    Comment: OCCASIONALLY  . Drug use: No    Current Outpatient Medications  Medication Sig Dispense Refill  . albuterol (PROVENTIL HFA;VENTOLIN HFA) 108 (90 BASE) MCG/ACT inhaler Inhale 1-2 puffs into the lungs every 6 (six) hours as needed for wheezing or shortness of breath.    . carvedilol (COREG) 6.25 MG tablet Take 1 tablet (6.25 mg total) by mouth 2 (two) times daily with a meal. 180 tablet 0  . cephALEXin (  KEFLEX) 500 MG capsule Take 1 capsule (500 mg total) by mouth 3 (three) times daily. 30 capsule 0  . Dupilumab (DUPIXENT [place]) Inject 1 Syringe into the skin every 14 (fourteen) days.    . rivaroxaban (XARELTO) 20 MG TABS tablet Take 1 tablet (20 mg total) by mouth daily with supper. 90 tablet 0  . Rivaroxaban 15 & 20 MG TBPK Start with one 15mg  tablet by mouth twice a day with food. On Day 22, switch to one 20mg  tablet once a day with food. 51 each 0   No current facility-administered medications for this visit.     Allergies  Allergen Reactions  .  Lisinopril Swelling    Lip swelling    Review of Systems   Weight stable Appetite and energy and exercise tolerance have significantly improved Recently seen in the ED for treatment of's cystitis-given prescription for Keflex. No bleeding complications from the Xarelto.  BP (!) 134/94 (BP Location: Right Arm, Patient Position: Sitting, Cuff Size: Normal)   Pulse 68   Resp 18   Ht 5\' 9"  (1.753 m)   Wt 165 lb 9.6 oz (75.1 kg)   SpO2 98% Comment: RA  BMI 24.45 kg/m  Physical Exam Alert and comfortable Scattered rhonchi Sternal incision well-healed Heart rate regular without murmur No peripheral edema Neuro intact  Diagnostic Tests: Chest x-ray image personally reviewed, clear lung fields, no residual pleural effusion.  Sternal wires intact.  Impression: Patient doing well after sternotomy and closure of PFO left atrial clip. The sternal incision is healed well and he has no significant postoperative pain. He understands he can resume normal activities and lifting after March 1 but until then he should not lift more than 20 pounds.  Patient understands he needs Xarelto for 1 year-up to November 2020.  Plan: Patient will be followed by his medical physician and cardiology and return here as needed.   Len Childs, MD [facility] [phone]

## 2018-11-05 ENCOUNTER — Ambulatory Visit: Payer: Self-pay | Admitting: Physician Assistant

## 2018-11-05 ENCOUNTER — Ambulatory Visit (INDEPENDENT_AMBULATORY_CARE_PROVIDER_SITE_OTHER): Payer: Self-pay | Admitting: Physician Assistant

## 2018-11-05 ENCOUNTER — Encounter: Payer: Self-pay | Admitting: Physician Assistant

## 2018-11-05 VITALS — BP 130/80 | HR 62 | Ht 69.0 in | Wt 165.4 lb

## 2018-11-05 DIAGNOSIS — Z8774 Personal history of (corrected) congenital malformations of heart and circulatory system: Secondary | ICD-10-CM

## 2018-11-05 DIAGNOSIS — R319 Hematuria, unspecified: Secondary | ICD-10-CM

## 2018-11-05 DIAGNOSIS — I8290 Acute embolism and thrombosis of unspecified vein: Secondary | ICD-10-CM

## 2018-11-05 DIAGNOSIS — I480 Paroxysmal atrial fibrillation: Secondary | ICD-10-CM

## 2018-11-05 DIAGNOSIS — N184 Chronic kidney disease, stage 4 (severe): Secondary | ICD-10-CM

## 2018-11-05 NOTE — Patient Instructions (Addendum)
Medication Instructions:  Your physician recommends that you continue on your current medications as directed. Please refer to the Current Medication list given to you today.  If you need a refill on your cardiac medications before your next appointment, please call your pharmacy.   Lab work: None ordered If you have labs (blood work) drawn today and your tests are completely normal, you will receive your results only by: Marland Kitchen MyChart Message (if you have MyChart) OR . A paper copy in the mail If you have any lab test that is abnormal or we need to change your treatment, we will call you to review the results.  Testing/Procedures: Your physician has requested that you have an echocardiogram. Echocardiography is a painless test that uses sound waves to create images of your heart. It provides your doctor with information about the size and shape of your heart and how well your heart's chambers and valves are working. This procedure takes approximately one hour. There are no restrictions for this procedure.   Follow-Up: Your physician recommends that you schedule a follow-up appointment in: [place] DR. VARANASI   Any Other Special Instructions Will Be Listed Below (If Applicable). Please see and discuss with your primary care doctor about your liver abnormality     Echocardiogram An echocardiogram is a procedure that uses painless sound waves (ultrasound) to produce an image of the heart. Images from an echocardiogram can provide important information about:  Signs of coronary artery disease (CAD).  Aneurysm detection. An aneurysm is a weak or damaged part of an artery wall that bulges out from the normal force of blood pumping through the body.  Heart size and shape. Changes in the size or shape of the heart can be associated with certain conditions, including heart failure, aneurysm, and CAD.  Heart muscle function.  Heart valve function.  Signs of a past heart  attack.  Fluid buildup around the heart.  Thickening of the heart muscle.  A tumor or infectious growth around the heart valves. Tell a health care provider about:  Any allergies you have.  All medicines you are taking, including vitamins, herbs, eye drops, creams, and over-the-counter medicines.  Any blood disorders you have.  Any surgeries you have had.  Any medical conditions you have.  Whether you are pregnant or may be pregnant. What are the risks? Generally, this is a safe procedure. However, problems may occur, including:  Allergic reaction to dye (contrast) that may be used during the procedure. What happens before the procedure? No specific preparation is needed. You may eat and drink normally. What happens during the procedure?   An IV tube may be inserted into one of your veins.  You may receive contrast through this tube. A contrast is an injection that improves the quality of the pictures from your heart.  A gel will be applied to your chest.  A wand-like tool (transducer) will be moved over your chest. The gel will help to transmit the sound waves from the transducer.  The sound waves will harmlessly bounce off of your heart to allow the heart images to be captured in real-time motion. The images will be recorded on a computer. The procedure may vary among health care providers and hospitals. What happens after the procedure?  You may return to your normal, everyday life, including diet, activities, and medicines, unless your health care provider tells you not to do that. Summary  An echocardiogram is a procedure that uses painless sound waves (ultrasound)  to produce an image of the heart.  Images from an echocardiogram can provide important information about the size and shape of your heart, heart muscle function, heart valve function, and fluid buildup around your heart.  You do not need to do anything to prepare before this procedure. You may eat and  drink normally.  After the echocardiogram is completed, you may return to your normal, everyday life, unless your health care provider tells you not to do that. This information is not intended to replace advice given to you by your health care provider. Make sure you discuss any questions you have with your health care provider. Document Released: 10/04/2000 Document Revised: 11/09/2016 Document Reviewed: 11/09/2016 Elsevier Interactive Patient Education  2019 [employer].

## 2018-11-11 ENCOUNTER — Other Ambulatory Visit (HOSPITAL_COMMUNITY): Payer: Self-pay

## 2018-11-16 ENCOUNTER — Other Ambulatory Visit: Payer: Self-pay

## 2018-11-16 DIAGNOSIS — I739 Peripheral vascular disease, unspecified: Secondary | ICD-10-CM

## 2018-11-17 ENCOUNTER — Encounter: Payer: Self-pay | Admitting: Physician Assistant

## 2018-11-20 ENCOUNTER — Ambulatory Visit: Payer: Self-pay | Admitting: Vascular Surgery

## 2018-11-20 ENCOUNTER — Encounter (HOSPITAL_COMMUNITY): Payer: Self-pay

## 2018-11-20 ENCOUNTER — Encounter: Payer: Self-pay | Admitting: Vascular Surgery

## 2018-11-27 ENCOUNTER — Ambulatory Visit (HOSPITAL_COMMUNITY): Payer: Self-pay | Attending: Cardiology

## 2018-11-27 DIAGNOSIS — Z8774 Personal history of (corrected) congenital malformations of heart and circulatory system: Secondary | ICD-10-CM | POA: Insufficient documentation

## 2018-12-01 ENCOUNTER — Telehealth: Payer: Self-pay | Admitting: *Deleted

## 2018-12-01 NOTE — Telephone Encounter (Signed)
-----   Message from Charlie Pitter, [place] sent at 11/30/2018  2:36 PM EST ----- Please let patient know heart function is low-normal at 50-55% (previously 45-50% so this has improved). Right ventricle function has normalized. PFO closure looks good. Mildly dilated ascending aorta. Recommend echo 1 year to trend the mildly dilated ascending aorta - no acute concerns on this echo.  Dayna Dunn PA-C

## 2018-12-01 NOTE — Telephone Encounter (Signed)
Called pt re: echo results, left a message for pt to call back.

## 2018-12-03 NOTE — Telephone Encounter (Signed)
Called pt re: echo results and he has been made aware. Will repeat in 1 year

## 2018-12-14 ENCOUNTER — Encounter: Payer: Self-pay | Admitting: Internal Medicine

## 2019-01-01 ENCOUNTER — Encounter: Payer: Self-pay | Admitting: Family

## 2019-01-01 ENCOUNTER — Encounter (HOSPITAL_COMMUNITY): Payer: Self-pay

## 2019-01-01 ENCOUNTER — Ambulatory Visit: Payer: Self-pay | Admitting: Vascular Surgery

## 2019-01-18 ENCOUNTER — Encounter: Payer: Self-pay | Admitting: Internal Medicine

## 2019-01-18 ENCOUNTER — Telehealth: Payer: Self-pay | Admitting: Internal Medicine

## 2019-01-18 ENCOUNTER — Other Ambulatory Visit: Payer: Self-pay

## 2019-01-18 NOTE — Progress Notes (Deleted)
This is a telephone encounter between [employer] and Delice Bison on 01/18/2019 for ***. The visit was conducted with the patient located at {NAMES:[id number]::"home"} and Delice Bison at {NAMES:[id number]::"[facility]","[facility]","Home"}. The patient's identity was confirmed using their DOB and current address. The {WHO:3044014::"patient","his/her legal guardian","***"} has consented to being evaluated through a telephone encounter and understands the associated risks (an examination cannot be done and the patient may need to come in for an appointment) / benefits (allows the patient to remain at home, decreasing exposure to coronavirus). I personally spent {Numbers; [phone]} minutes on medical discussion.      CC: ***  HPI:  Mr.Saksham Seibert is a 48 y.o.   Past Medical History:  Diagnosis Date  . Asthma   . CKD (chronic kidney disease), stage IV ([place])   . DVT (deep venous thrombosis) ([place]) 08/2018  . Eczema herpeticum   . Erythroderma   . Erythroderma   . Hypertension   . PAF (paroxysmal atrial fibrillation) ([place])   . PFO (patent foramen ovale)   . Polycystic kidney disease   . Pulmonary emboli ([place])   . Syncope   . 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  . Wears glasses    Review of Systems:  ***    Assessment & Plan:   See Encounters Tab for problem based charting.  Patient {GC/GE:3044014::"discussed with","seen with"} Dr. {NAMES:3044014::"Butcher","Granfortuna","E. Hoffman","Klima","Mullen","Narendra","Raines","Vincent"}

## 2019-01-18 NOTE — Telephone Encounter (Signed)
Tried to reach [name] for telephone encounter. There was no answer, and he does not have a voicemail box set up. I'm happy to reschedule a televisit with him on a future continuity clinic day if he calls back.

## 2019-01-25 NOTE — Progress Notes (Signed)
This encounter was created in error - please disregard.  This encounter was created in error - please disregard.

## 2019-03-09 ENCOUNTER — Telehealth: Payer: Self-pay

## 2019-03-09 NOTE — Telephone Encounter (Signed)
Virtual Visit Pre-Appointment Phone Call  "(Name), I am calling you today to discuss your upcoming appointment. We are currently trying to limit exposure to the virus that causes COVID-19 by seeing patients at home rather than in the office."  1. "What is the BEST phone number to call the day of the visit?" - include this in appointment notes  2. "Do you have or have access to (through a family member/friend) a smartphone with video capability that we can use for your visit?" a. If yes - list this number in appt notes as "cell" (if different from BEST phone #) and list the appointment type as a VIDEO visit in appointment notes b. If no - list the appointment type as a PHONE visit in appointment notes  3. Confirm consent - "In the setting of the current Covid19 crisis, you are scheduled for a (phone or video) visit with your provider on (date) at (time).  Just as we do with many in-office visits, in order for you to participate in this visit, we must obtain consent.  If you'd like, I can send this to your mychart (if signed up) or email for you to review.  Otherwise, I can obtain your verbal consent now.  All virtual visits are billed to your insurance company just like a normal visit would be.  By agreeing to a virtual visit, we'd like you to understand that the technology does not allow for your provider to perform an examination, and thus may limit your provider's ability to fully assess your condition. If your provider identifies any concerns that need to be evaluated in person, we will make arrangements to do so.  Finally, though the technology is pretty good, we cannot assure that it will always work on either your or our end, and in the setting of a video visit, we may have to convert it to a phone-only visit.  In either situation, we cannot ensure that we have a secure connection.  Are you willing to proceed?" STAFF: Did the patient verbally acknowledge consent to telehealth visit? yes  4.  Advise patient to be prepared - "Two hours prior to your appointment, go ahead and check your blood pressure, pulse, oxygen saturation, and your weight (if you have the equipment to check those) and write them all down. When your visit starts, your provider will ask you for this information. If you have an Apple Watch or Kardia device, please plan to have heart rate information ready on the day of your appointment. Please have a pen and paper handy nearby the day of the visit as well."  5. Give patient instructions for MyChart download to smartphone OR Doximity/Doxy.me as below if video visit (depending on what platform provider is using)  6. Inform patient they will receive a phone call 15 minutes prior to their appointment time (may be from unknown caller ID) so they should be prepared to answer    [name] has been deemed a candidate for a follow-up tele-health visit to limit community exposure during the Covid-19 pandemic. I spoke with the patient via phone to ensure availability of phone/video source, confirm preferred email & phone number, and discuss instructions and expectations.  I reminded [name] to be prepared with any vital sign and/or heart rhythm information that could potentially be obtained via home monitoring, at the time of his visit. I reminded [name] to expect a phone call prior to his visit.  [name] [name],  CMA 03/09/2019 1:46 PM   INSTRUCTIONS FOR DOWNLOADING THE MYCHART APP TO SMARTPHONE  - The patient must first make sure to have activated MyChart and know their login information - If Apple, go to [employer] and type in MyChart in the search bar and download the app. If Android, ask patient to go to [employer] and type in [place] in the search bar and download the app. The app is free but as with any other app downloads, their phone may require them to verify saved payment information or Apple/Android password.  - The  patient will need to then log into the app with their MyChart username and password, and select [facility] as their healthcare provider to link the account. When it is time for your visit, go to the MyChart app, find appointments, and click Begin Video Visit. Be sure to Select Allow for your device to access the Microphone and Camera for your visit. You will then be connected, and your provider will be with you shortly.  **If they have any issues connecting, or need assistance please contact MyChart service desk [phone])**  **If using a computer, in order to ensure the best quality for their visit they will need to use either of the following Internet Browsers: [employer], or Google Chrome**  IF USING DOXIMITY or DOXY.ME - The patient will receive a link just prior to their visit by text.     FULL LENGTH CONSENT FOR TELE-HEALTH VISIT   I hereby voluntarily request, consent and authorize [facility] and its employed or contracted physicians, physician assistants, nurse practitioners or other licensed health care professionals (the Practitioner), to provide me with telemedicine health care services (the "Services") as deemed necessary by the treating Practitioner. I acknowledge and consent to receive the Services by the Practitioner via telemedicine. I understand that the telemedicine visit will involve communicating with the Practitioner through live audiovisual communication technology and the disclosure of certain medical information by electronic transmission. I acknowledge that I have been given the opportunity to request an in-person assessment or other available alternative prior to the telemedicine visit and am voluntarily participating in the telemedicine visit.  I understand that I have the right to withhold or withdraw my consent to the use of telemedicine in the course of my care at any time, without affecting my right to future care or treatment, and that the  Practitioner or I may terminate the telemedicine visit at any time. I understand that I have the right to inspect all information obtained and/or recorded in the course of the telemedicine visit and may receive copies of available information for a reasonable fee.  I understand that some of the potential risks of receiving the Services via telemedicine include:  Marland Kitchen Delay or interruption in medical evaluation due to technological equipment failure or disruption; . Information transmitted may not be sufficient (e.g. poor resolution of images) to allow for appropriate medical decision making by the Practitioner; and/or  . In rare instances, security protocols could fail, causing a breach of personal health information.  Furthermore, I acknowledge that it is my responsibility to provide information about my medical history, conditions and care that is complete and accurate to the best of my ability. I acknowledge that Practitioner's advice, recommendations, and/or decision may be based on factors not within their control, such as incomplete or inaccurate data provided by me or distortions of diagnostic images or specimens that may result from electronic transmissions. I understand that the practice of  medicine is not an Chief Strategy Officer and that Practitioner makes no warranties or guarantees regarding treatment outcomes. I acknowledge that I will receive a copy of this consent concurrently upon execution via email to the email address I last provided but may also request a printed copy by calling the office of [facility].    I understand that my insurance will be billed for this visit.   I have read or had this consent read to me. . I understand the contents of this consent, which adequately explains the benefits and risks of the Services being provided via telemedicine.  . I have been provided ample opportunity to ask questions regarding this consent and the Services and have had my questions answered to my  satisfaction. . I give my informed consent for the services to be provided through the use of telemedicine in my medical care  By participating in this telemedicine visit I agree to the above.

## 2019-03-10 NOTE — Progress Notes (Signed)
No show  Signed, Larae Grooms, MD  03/10/2019 2:46 PM    [facility]

## 2019-03-11 ENCOUNTER — Telehealth: Payer: Self-pay | Admitting: Interventional Cardiology

## 2019-03-11 ENCOUNTER — Encounter: Payer: Self-pay | Admitting: Interventional Cardiology

## 2019-03-11 ENCOUNTER — Other Ambulatory Visit: Payer: Self-pay

## 2019-05-10 ENCOUNTER — Encounter: Payer: Self-pay | Admitting: Internal Medicine

## 2020-02-19 DIAGNOSIS — S0292XA Unspecified fracture of facial bones, initial encounter for closed fracture: Secondary | ICD-10-CM

## 2020-02-19 HISTORY — DX: Unspecified fracture of facial bones, initial encounter for closed fracture: S02.92XA

## 2020-03-16 DIAGNOSIS — I609 Nontraumatic subarachnoid hemorrhage, unspecified: Secondary | ICD-10-CM | POA: Insufficient documentation

## 2020-03-21 HISTORY — PX: ORIF MANDIBULAR FRACTURE: SHX2127

## 2020-07-24 ENCOUNTER — Ambulatory Visit: Payer: Self-pay | Admitting: Internal Medicine

## 2020-09-01 ENCOUNTER — Ambulatory Visit: Payer: Self-pay | Admitting: Internal Medicine

## 2020-10-18 IMAGING — DX DG CHEST 1V PORT
1 series · 1 of 1 positions shown · non-contrast
Comparison: 09/19/2018

CLINICAL DATA: Shortness of breath.

EXAM:
PORTABLE CHEST 1 VIEW

[chest]
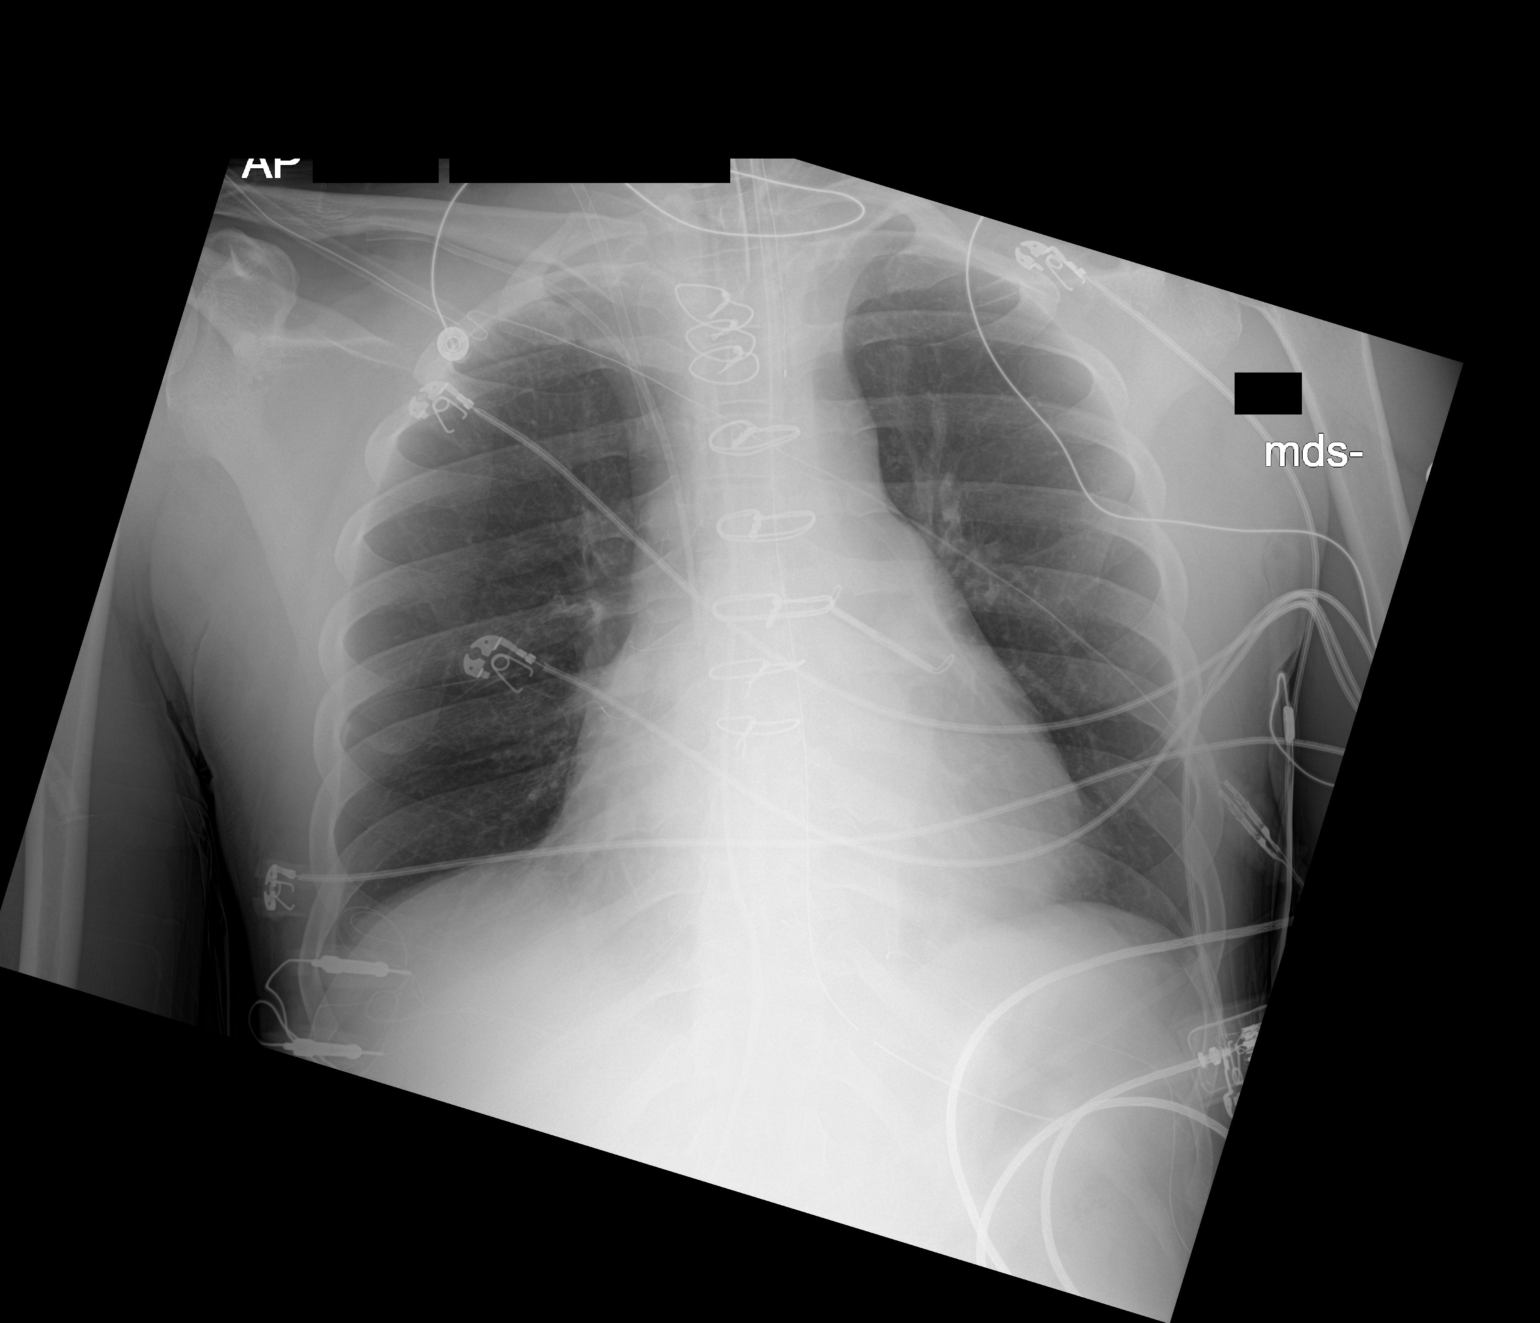

[1 of 1 positions shown; findings below may reference images not displayed]

FINDINGS: Stable support apparatus and postsurgical changes.

Cardiomediastinal silhouette is normal. Mediastinal contours appear
intact.

There is no evidence of focal airspace consolidation, pleural
effusion or pneumothorax.

Osseous structures are without acute abnormality. Soft tissues are
grossly normal.
IMPRESSION: Stable support apparatus.

No evidence of pulmonary consolidation or edema.

## 2020-10-19 IMAGING — DX DG CHEST 1V PORT
1 series · 1 of 1 positions shown · non-contrast
Comparison: 09/20/2018 and earlier.

CLINICAL DATA: 47-year-old male postoperative day 2 from cardiac
surgery for PFO, atrial thrombus.

EXAM:
PORTABLE CHEST 1 VIEW

[chest ap]
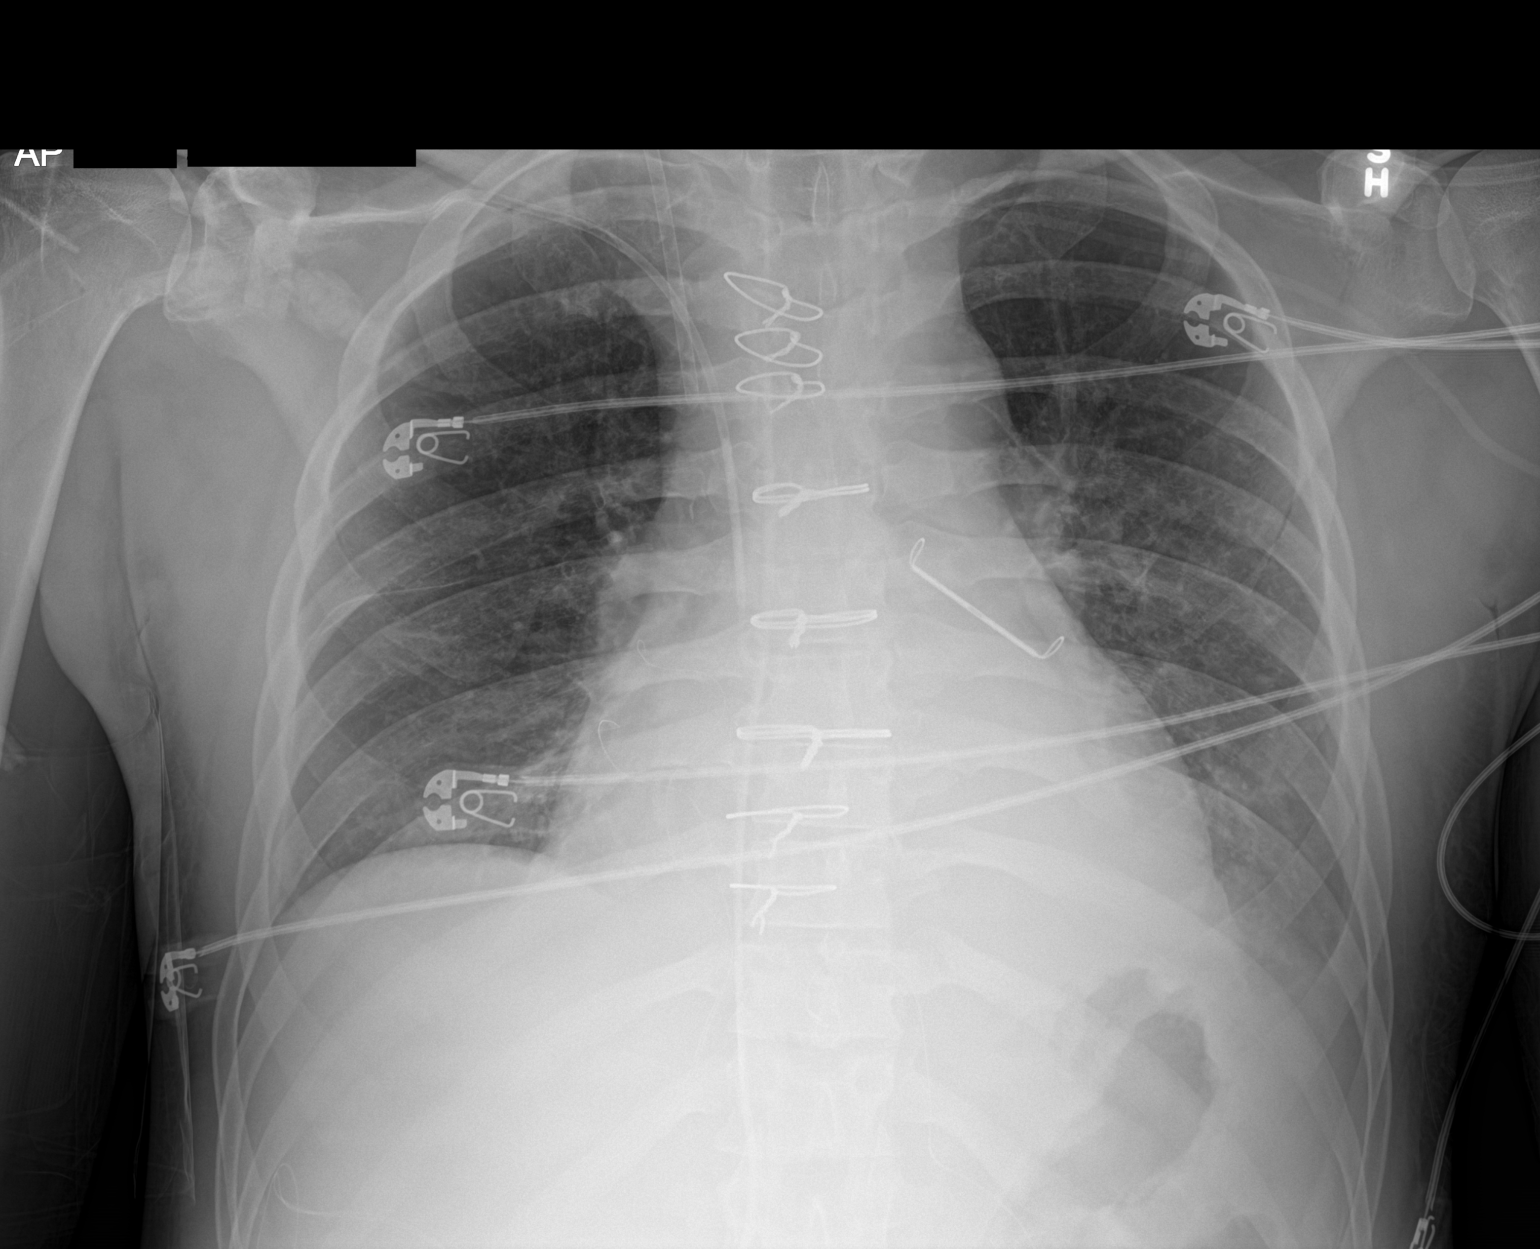

[1 of 1 positions shown; findings below may reference images not displayed]

FINDINGS: Portable AP semi upright view at 1619 hours. Extubated and enteric
tube removed. Stable right IJ introducer sheath and right subclavian
approach central line. The mediastinal tube may remain in place.

Confluent left lung base opacity since yesterday with new mild
perihilar streaky opacity. No pneumothorax or pulmonary edema.

Stable cardiac size and mediastinal contours. Negative visible bowel
gas pattern.
IMPRESSION: 1. Extubated and enteric tube removed. Otherwise, stable lines and
tubes.
2. Left lower lobe atelectasis or consolidation since yesterday. No
pneumothorax.

## 2020-10-21 IMAGING — DX DG CHEST 1V PORT
1 series · 1 of 1 positions shown · non-contrast
Comparison: 09/22/2018, 09/21/2018 and earlier, including CT chest
09/17/2018.

CLINICAL DATA: Postop day 4 repair of patent foramen ovale and LEFT
atrial appendage clipping. Residual chest pain and shortness of
breath. Follow-up effusions and atelectasis.

EXAM:
PORTABLE CHEST 1 VIEW

[chest ap]
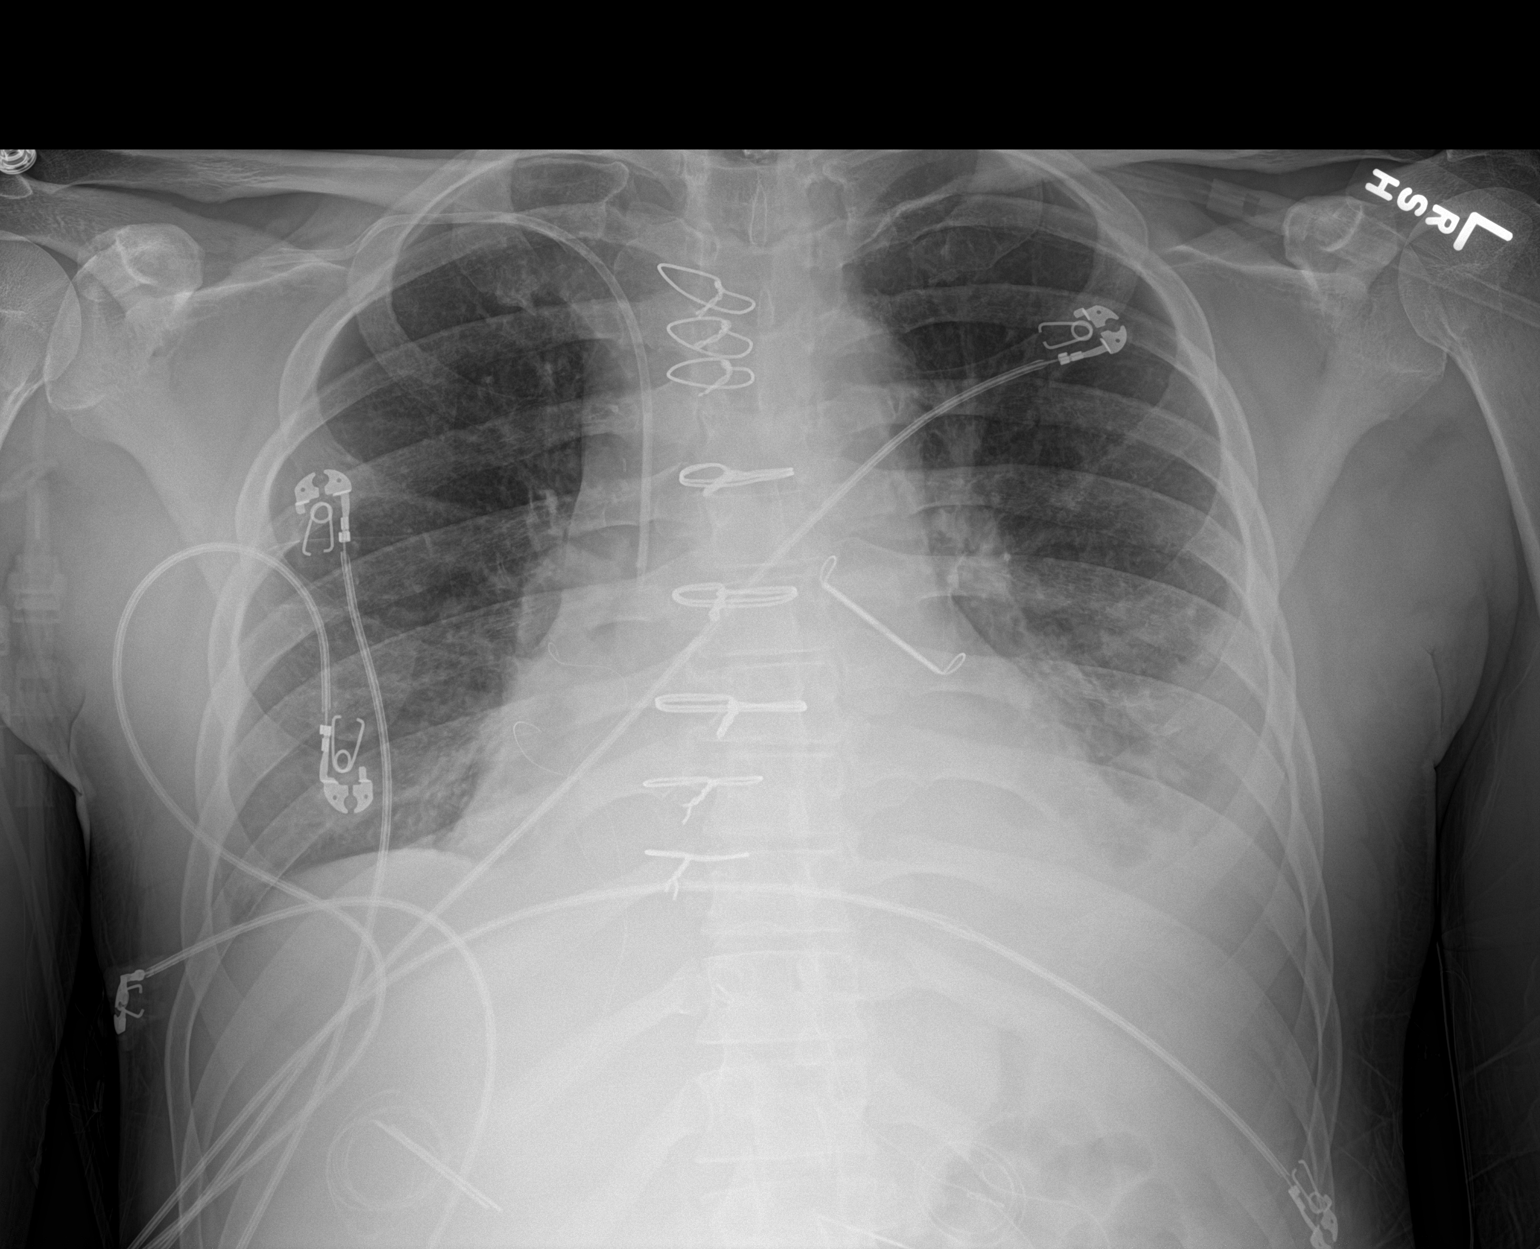

[1 of 1 positions shown; findings below may reference images not displayed]

FINDINGS: Sternotomy for LEFT atrial appendage clipping. RIGHT subclavian
central venous catheter tip projects over the mid to LOWER SVC,
unchanged. Cardiac silhouette mildly enlarged, unchanged. Pulmonary
vascularity normal. Moderate-sized LEFT pleural effusion and small
RIGHT pleural effusion, unchanged. Dense consolidation in the LEFT
LOWER LOBE and mild atelectasis in the RIGHT LOWER LOBE, unchanged.
No new abnormalities.
IMPRESSION: 1. Stable moderate-sized LEFT pleural effusion, small RIGHT pleural
effusion, dense LEFT LOWER LOBE atelectasis and mild RIGHT LOWER
LOBE atelectasis.
2. No new abnormalities.

## 2020-10-22 IMAGING — CR DG CHEST 2V
2 series · 2 of 2 positions shown · non-contrast
Comparison: 09/23/2018

CLINICAL DATA: Shortness of breath.

EXAM:
CHEST - 2 VIEW

[chest pa]
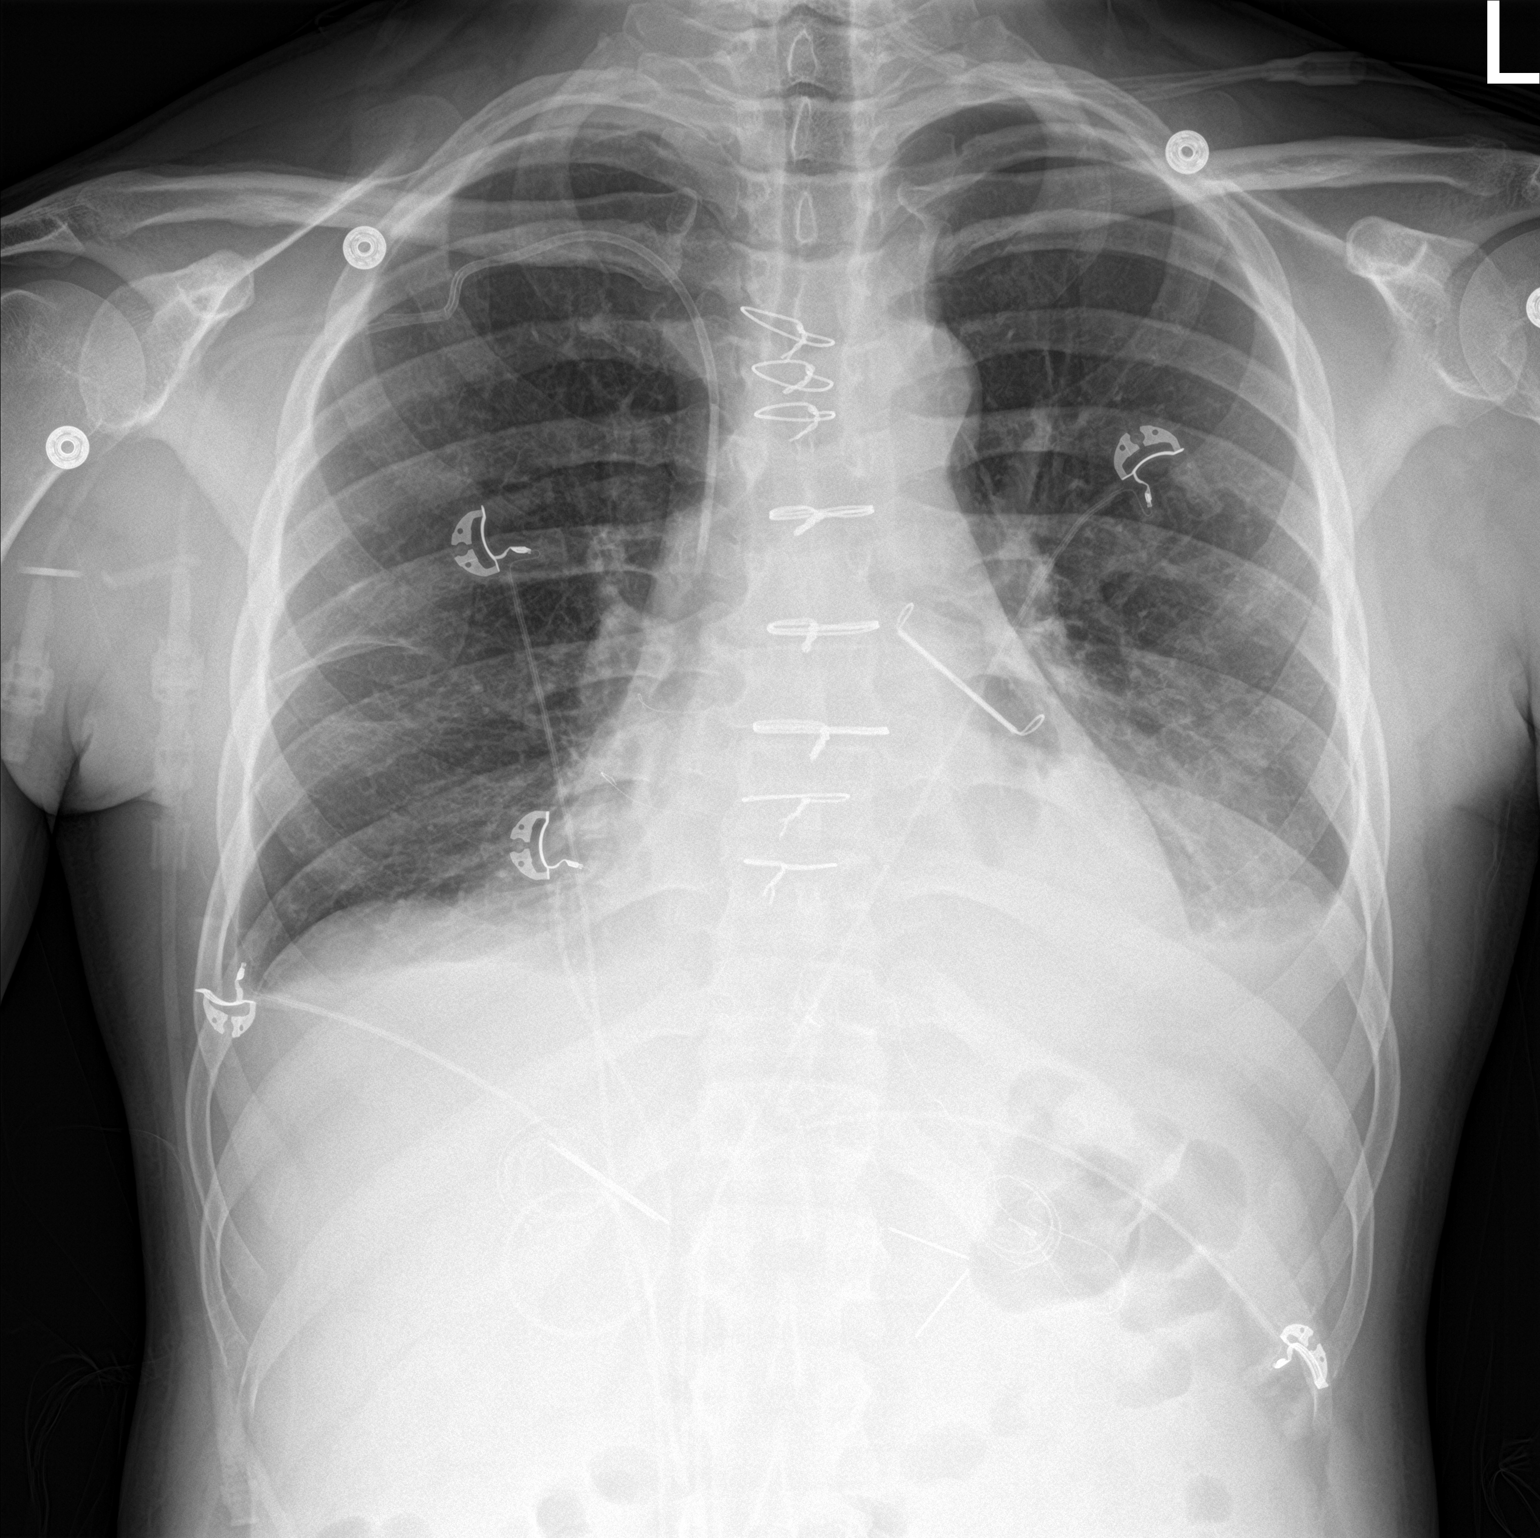

[chest lat]
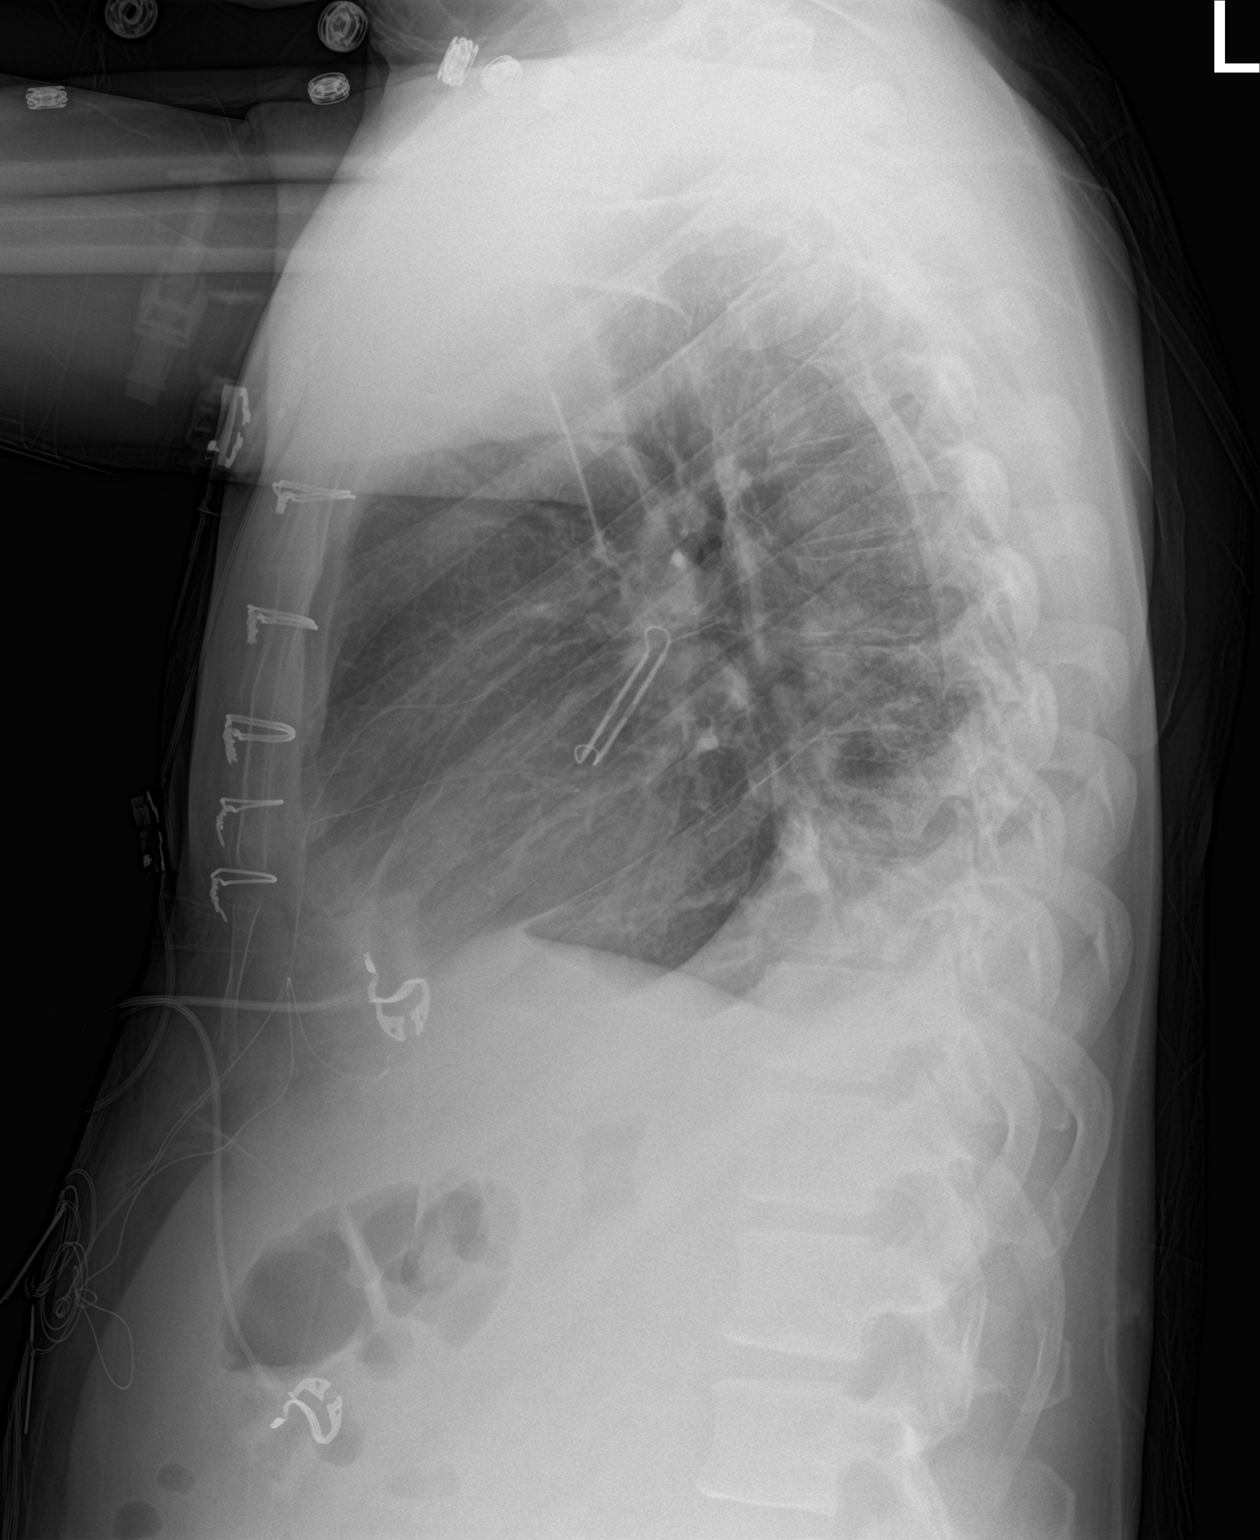

[2 of 2 positions shown; findings below may reference images not displayed]

FINDINGS: Central line is unchanged. Cardiomegaly as seen yesterday.
Persistent bilateral effusions, larger on the left than the right,
with atelectasis at the lung bases. No qualitatively new finding.
Upper lungs remain clear.
IMPRESSION: Persistent effusions with lower lobe atelectasis, left worse than
right.

## 2020-10-27 ENCOUNTER — Ambulatory Visit: Payer: Self-pay | Admitting: Internal Medicine

## 2020-10-27 ENCOUNTER — Other Ambulatory Visit: Payer: Self-pay

## 2020-10-27 ENCOUNTER — Encounter: Payer: Self-pay | Admitting: Internal Medicine

## 2020-10-27 VITALS — BP 170/110 | HR 68 | Resp 16 | Ht 69.5 in | Wt 148.5 lb

## 2020-10-27 DIAGNOSIS — Q613 Polycystic kidney, unspecified: Secondary | ICD-10-CM

## 2020-10-27 DIAGNOSIS — L309 Dermatitis, unspecified: Secondary | ICD-10-CM

## 2020-10-27 DIAGNOSIS — Z13220 Encounter for screening for lipoid disorders: Secondary | ICD-10-CM

## 2020-10-27 DIAGNOSIS — I8290 Acute embolism and thrombosis of unspecified vein: Secondary | ICD-10-CM

## 2020-10-27 DIAGNOSIS — J45909 Unspecified asthma, uncomplicated: Secondary | ICD-10-CM

## 2020-10-27 DIAGNOSIS — N2889 Other specified disorders of kidney and ureter: Secondary | ICD-10-CM

## 2020-10-27 DIAGNOSIS — I151 Hypertension secondary to other renal disorders: Secondary | ICD-10-CM

## 2020-10-27 MED ORDER — LOSARTAN POTASSIUM 50 MG PO TABS: 50.0000 mg | ORAL_TABLET | Freq: Every day | ORAL | 11 refills | Status: DC

## 2020-10-27 NOTE — Progress Notes (Signed)
Subjective:    Patient ID: [name], male   DOB: 1971/04/22, 50 y.o.   MRN: [id number]   HPI   Here to establish  1.  Extensive cardiovascular history with hospitalization from 09/16/2018 when patient suffered recurrent syncope. Ultimately found to be in atrial fibrillation with rapid ventricular response.   CT without PE, but did show left hepatic mass measuring 3.4 cm and multiple hepatic cysts as well as possible UPJ obstruction. VQ scan the following day did support multiple bilateral areas highly suspicious for PEs. Echo showed right atrial clot. Follow up TEE showed a large thrombus straddling a PFO.  2/3 of the thrombus was in left atrium and prolapsed across the mitral valve into the left ventricle.  Underwent sternotomy, right atriotomy for closure of PFO, clipping of left atrial appendage on 09/19/18 with Dr. Prescott Gum.  LE venous exam on 09/20/18 showed DVT of left popliteal vein postop--not clear if this was felt to precede his surgery or not. Atrial fib resolved following procedure.   He was discharged on Rivaraoxaban and to contemplate lifelong or indefinite anticoagulation with his primary care provider.  He never returned and not clear how long he continued anticoagulation. Was also to follow up on left liver mass, again, which did not occur.   2.  Polycystic Kidney Disease:  Thinks he was diagnosed in 2011.   Was following with Dr. Clover Mealy with [facility], but does not believe he has seen her since 2017.  States lost job in 2019 and could no longer afford care as well. Maternal grandmother, mother, and maternal uncle all with same diagnosis.  Mother was on dialysis, but currently with functioning second kidney transplant.  3.  Hypertension:  Diagnosed in 2004 prior to knowledge of PKD.  Lip swelling with Lisinopril, but was on Losartan following this.  Just ran out and unable to go to doctor.    4.  Eczema and Asthma:  States on Dupixent every 14 days for  both.  Prescribed by Dermatology. Eczema just became a problem about 2 years ago by his history.   Asthma diagnosis since infancy. Uses Albuterol once daily. Describes being on Advair previously, which helped, but he never used regularly.  " I don't do meds like that."   Current Meds  Medication Sig   albuterol (PROVENTIL HFA;VENTOLIN HFA) 108 (90 BASE) MCG/ACT inhaler Inhale 1-2 puffs into the lungs every 6 (six) hours as needed for wheezing or shortness of breath.   dupilumab (DUPIXENT) 300 MG/2ML prefilled syringe Inject 300 mg into the skin once. Inject subcutaneously every 14 days.   Allergies  Allergen Reactions   Lisinopril Swelling    Lip swelling     There is no immunization history on file for this patient. Refused influenza and COVID vaccination.  Past Medical History:  Diagnosis Date   Asthma    Since an infant   CKD (chronic kidney disease), stage IV ([place])    DVT (deep venous thrombosis) ([place]) 08/2018   Eczema herpeticum    Erythroderma    Erythroderma    Hypertension    Not clear if secondary to PCKD or primary   PAF (paroxysmal atrial fibrillation) (HCC)    PFO (patent foramen ovale)    Polycystic kidney disease    Pulmonary emboli ([place])    Syncope    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   Wears glasses     Past Surgical History:  Procedure Laterality Date  CLIPPING OF ATRIAL APPENDAGE  09/19/2018   Procedure: CLIPPING OF LEFT ATRIAL APPENDAGE;  Surgeon: Ivin Poot, MD;  Location: [place];  Service: Open Heart Surgery;;   [place] Right    INTRAOPERATIVE TRANSESOPHAGEAL ECHOCARDIOGRAM N/A 09/19/2018   Procedure: INTRAOPERATIVE TRANSESOPHAGEAL ECHOCARDIOGRAM;  Surgeon: Ivin Poot, MD;  Location: [place];  Service: Open Heart Surgery;  Laterality: N/A;   LEFT HEART CATH AND CORONARY ANGIOGRAPHY N/A 09/18/2018   Procedure: LEFT HEART CATH AND CORONARY ANGIOGRAPHY;  Surgeon: Jettie Booze, MD;  Location: [facility];  Service: Cardiovascular;  Laterality: N/A;   ORIF MANDIBULAR FRACTURE Bilateral 03/2020   Dental extractions associated with surgery as well.  [facility]   REPAIR OF PATENT FORAMEN OVALE N/A 09/19/2018   Procedure: CLOSURE OF PATENT FORAMEN OVALE;  Surgeon: Ivin Poot, MD;  Location: [place];  Service: Open Heart Surgery;  Laterality: N/A;   TEE WITHOUT CARDIOVERSION N/A 09/18/2018   Procedure: TRANSESOPHAGEAL ECHOCARDIOGRAM (TEE);  Surgeon: Sanda Klein, MD;  Location: [facility];  Service: Cardiovascular;  Laterality: N/A;    Family History  Problem Relation Age of Onset   Kidney disease Mother        Polycystic Kidney Disease:  2 renal transplants   Hypertension Mother    Asthma Father    Kidney disease Father    COPD Father        smoker--reported cause of death   Eczema Sister    Kidney disease Maternal Grandmother        Polycystic Kidney Disease   Kidney disease Maternal Uncle        Polycystic Kidney Disease   Asthma Daughter     Social History   Social History Narrative   SDOH and social history difficult with patient.   May be living in a hotel for now in [place], though considers his sister's home in [place] his home base.   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  Was driving trucks at the time.   States drinking alcohol since age 32 yo, but does not feel his alcohol use is a problem as he can quit whenever he chooses.    His two daughters live in [place] with their mom.   Social History   Tobacco Use   Smoking status: Current Every Day Smoker    Packs/day: 1.00    Years: 26.00    Pack years: 26.00    Types: Cigarettes   Smokeless tobacco: Never Used  Vaping Use   Vaping Use: Never used  Substance Use Topics   Alcohol use: Yes    Comment: 1/2 gallon of vodka every 2-3 days documented 10/2020.  Denies a problem as he can stop whenever he wants.   Drug use: Not Currently     Types: Marijuana    Comment: Denies other forms of drug use.     Review of Systems    Objective:   BP (!) 170/110 (BP Location: Left Arm, Patient Position: Sitting, Cuff Size: Normal)    Pulse 68    Resp 16    Ht 5' 9.5" (1.765 m)    Wt 148 lb 8 oz (67.4 kg)    BMI 21.62 kg/m   Physical Exam  NAD Smells strongly of cigarette smoke Wheezy cough with laughing.  HEENT:  PERRL, EOMI, TMs pearly gray, throat without injection Neck:  Supple, No adenopathy, not thyromegaly Chest:  Mild diffuse expiratory wheeze.  No  crackles. CV:  RRR with normal S1 and S2, No S3, S4 or murmur.  No carotid bruit.  Carotid, radial and DP pulses normal and equal Abd:  S, NT, No HSM or mass, + BS.  Dime to nickel sized supraumbilical hernia that is easily reducible. LE:  No edema. Skin:  Scattered areas of dryness and flaking.  Most pronounced in suprapubic area of abdomen and ankles bilaterally.   Assessment & Plan  1.  Hypertension and CKD due to polycystic kidney disease:   Restart Losartan, starting with 50 mg.   Attempted to draw blood today, but only able to get a small amount of blood with veins rolling.  Will see if can get BUN and creatinine done at least.   2. CKD/PCKD:  Labs as above.  3.  History of DVT/PE/Transatrial thrombus:  Not clear if this was felt to be one process as DVT found later after cardiac surgery when leg became symptomatic.   Suspect this patient would not be compliant with chronic anticoagulation even if had regular access. He has remained asymptomatic since stopped anticoag however.  4.  Asthma/COPD:  Emphysematous changes on chest CT previously.  Extensive discussion about cessation of tobacco as most important aspect of treating his pulmonary disease.   Given options--currently would recommend nicotine patches.   Not interested. Also discussed recommendations for maintenance therapy with inhaled corticosteroid/LABA.  He states he "doesn't use medications like that"  and will only use the inhaler when he feels he needs it.  Discussed does not work that way.  Not interested in working on MAP prescription for that  He does have an albuterol inhaler.    5.  Eczema:  Severe case when looking back in chart.  Not clear if following with Dr. Ronalee Red at [facility] still.   Haynes Dage evasive with history today.

## 2020-10-28 ENCOUNTER — Encounter: Payer: Self-pay | Admitting: Internal Medicine

## 2020-10-28 DIAGNOSIS — J45909 Unspecified asthma, uncomplicated: Secondary | ICD-10-CM | POA: Insufficient documentation

## 2020-10-28 DIAGNOSIS — I10 Essential (primary) hypertension: Secondary | ICD-10-CM | POA: Insufficient documentation

## 2020-10-31 LAB — LIPID PANEL W/O CHOL/HDL RATIO
Cholesterol, Total: 174 mg/dL (ref 100–199)
HDL: 88 mg/dL (ref >39)
LDL Chol Calc (NIH): 75 mg/dL (ref 0–99)
Triglycerides: 54 mg/dL (ref 0–149)
VLDL Cholesterol Cal: 11 mg/dL (ref 5–40)

## 2020-10-31 LAB — COMPREHENSIVE METABOLIC PANEL
ALT: 12 IU/L (ref 0–44)
AST: 18 IU/L (ref 0–40)
Albumin/Globulin Ratio: 1.1 — ABNORMAL LOW (ref 1.2–2.2)
Albumin: 3.8 g/dL — ABNORMAL LOW (ref 4.0–5.0)
Alkaline Phosphatase: 87 IU/L (ref 44–121)
BUN/Creatinine Ratio: 11 (ref 9–20)
BUN: 18 mg/dL (ref 6–24)
Bilirubin Total: 0.6 mg/dL (ref 0.0–1.2)
CO2: 19 mmol/L — ABNORMAL LOW (ref 20–29)
Calcium: 8.9 mg/dL (ref 8.7–10.2)
Chloride: 108 mmol/L — ABNORMAL HIGH (ref 96–106)
Creatinine, Ser: 1.58 mg/dL — ABNORMAL HIGH (ref 0.76–1.27)
GFR calc Af Amer: 59 mL/min/{1.73_m2} — ABNORMAL LOW (ref >59)
GFR calc non Af Amer: 51 mL/min/{1.73_m2} — ABNORMAL LOW (ref >59)
Globulin, Total: 3.5 g/dL (ref 1.5–4.5)
Glucose: 75 mg/dL (ref 65–99)
Potassium: 4.4 mmol/L (ref 3.5–5.2)
Sodium: 144 mmol/L (ref 134–144)
Total Protein: 7.3 g/dL (ref 6.0–8.5)

## 2020-11-13 ENCOUNTER — Other Ambulatory Visit: Payer: Self-pay | Admitting: Internal Medicine

## 2020-11-14 ENCOUNTER — Other Ambulatory Visit: Payer: Self-pay | Admitting: Internal Medicine

## 2020-11-14 ENCOUNTER — Other Ambulatory Visit: Payer: Self-pay

## 2020-11-14 DIAGNOSIS — I151 Hypertension secondary to other renal disorders: Secondary | ICD-10-CM

## 2020-11-14 DIAGNOSIS — Q613 Polycystic kidney, unspecified: Secondary | ICD-10-CM

## 2020-11-14 DIAGNOSIS — N2889 Other specified disorders of kidney and ureter: Secondary | ICD-10-CM

## 2020-11-14 NOTE — Progress Notes (Signed)
Went to [facility] to pick up Losartan and never obtained. Discussed his good cholesterol and increased creatinine due to bp and polycystic kidney disease. He will pick up from [facility] where sent and come back for bp check, BmP, CBC in 2 weeks.

## 2020-11-28 ENCOUNTER — Other Ambulatory Visit: Payer: Self-pay

## 2020-11-28 ENCOUNTER — Other Ambulatory Visit: Payer: Self-pay | Admitting: Internal Medicine

## 2020-11-28 VITALS — BP 160/100 | HR 84

## 2020-11-28 DIAGNOSIS — I10 Essential (primary) hypertension: Secondary | ICD-10-CM

## 2020-11-28 DIAGNOSIS — Z79899 Other long term (current) drug therapy: Secondary | ICD-10-CM

## 2020-11-28 MED ORDER — AMLODIPINE BESYLATE 5 MG PO TABS: 5.0000 mg | ORAL_TABLET | Freq: Every day | ORAL | 11 refills | Status: DC

## 2020-11-28 NOTE — Progress Notes (Signed)
Smoked before coming in.   States taking Losartan and no problems  BP remains quite high. Discussed increasing Losartan vs. Adding another low dose bp med. Elected to add Amlodipine 5 mg daily. Repeat BP check in 1 months CBC and BMP today as well.

## 2020-11-29 LAB — CBC WITH DIFFERENTIAL/PLATELET
Basophils Absolute: 0.1 10*3/uL (ref 0.0–0.2)
Basos: 2 %
EOS (ABSOLUTE): 0.4 10*3/uL (ref 0.0–0.4)
Eos: 9 %
Hematocrit: 45.2 % (ref 37.5–51.0)
Hemoglobin: 15.2 g/dL (ref 13.0–17.7)
Immature Grans (Abs): 0 10*3/uL (ref 0.0–0.1)
Immature Granulocytes: 0 %
Lymphocytes Absolute: 1.4 10*3/uL (ref 0.7–3.1)
Lymphs: 30 %
MCH: 34.2 pg — ABNORMAL HIGH (ref 26.6–33.0)
MCHC: 33.6 g/dL (ref 31.5–35.7)
MCV: 102 fL — ABNORMAL HIGH (ref 79–97)
Monocytes Absolute: 0.4 10*3/uL (ref 0.1–0.9)
Monocytes: 8 %
Neutrophils Absolute: 2.5 10*3/uL (ref 1.4–7.0)
Neutrophils: 51 %
Platelets: 267 10*3/uL (ref 150–450)
RBC: 4.45 x10E6/uL (ref 4.14–5.80)
RDW: 15.3 % (ref 11.6–15.4)
WBC: 4.8 10*3/uL (ref 3.4–10.8)

## 2020-11-29 LAB — BASIC METABOLIC PANEL
BUN/Creatinine Ratio: 10 (ref 9–20)
BUN: 17 mg/dL (ref 6–24)
CO2: 23 mmol/L (ref 20–29)
Calcium: 9.1 mg/dL (ref 8.7–10.2)
Chloride: 105 mmol/L (ref 96–106)
Creatinine, Ser: 1.66 mg/dL — ABNORMAL HIGH (ref 0.76–1.27)
GFR calc Af Amer: 56 mL/min/{1.73_m2} — ABNORMAL LOW (ref >59)
GFR calc non Af Amer: 48 mL/min/{1.73_m2} — ABNORMAL LOW (ref >59)
Glucose: 85 mg/dL (ref 65–99)
Potassium: 4.5 mmol/L (ref 3.5–5.2)
Sodium: 143 mmol/L (ref 134–144)

## 2021-01-22 ENCOUNTER — Other Ambulatory Visit: Payer: Self-pay | Admitting: Internal Medicine

## 2021-12-14 ENCOUNTER — Other Ambulatory Visit: Payer: Self-pay

## 2021-12-14 MED ORDER — AMLODIPINE BESYLATE 5 MG PO TABS: 5.0000 mg | ORAL_TABLET | Freq: Every day | ORAL | 1 refills | Status: DC

## 2021-12-14 MED ORDER — LOSARTAN POTASSIUM 50 MG PO TABS: 50.0000 mg | ORAL_TABLET | Freq: Every day | ORAL | 1 refills | Status: DC

## 2022-02-13 ENCOUNTER — Ambulatory Visit: Payer: Self-pay | Admitting: Internal Medicine

## 2022-03-04 ENCOUNTER — Encounter: Payer: Self-pay | Admitting: Internal Medicine

## 2022-06-05 ENCOUNTER — Encounter: Payer: Self-pay | Admitting: Student

## 2022-06-05 ENCOUNTER — Ambulatory Visit: Payer: Self-pay | Admitting: Student

## 2022-06-05 ENCOUNTER — Other Ambulatory Visit: Payer: Self-pay

## 2022-06-05 VITALS — BP 140/101 | HR 92 | Temp 98.6°F | Ht 69.0 in | Wt 154.5 lb

## 2022-06-05 DIAGNOSIS — I77810 Thoracic aortic ectasia: Secondary | ICD-10-CM

## 2022-06-05 DIAGNOSIS — I10 Essential (primary) hypertension: Secondary | ICD-10-CM

## 2022-06-05 DIAGNOSIS — I2699 Other pulmonary embolism without acute cor pulmonale: Secondary | ICD-10-CM

## 2022-06-05 DIAGNOSIS — Z5989 Other problems related to housing and economic circumstances: Secondary | ICD-10-CM

## 2022-06-05 DIAGNOSIS — Z86711 Personal history of pulmonary embolism: Secondary | ICD-10-CM

## 2022-06-05 DIAGNOSIS — Q613 Polycystic kidney, unspecified: Secondary | ICD-10-CM

## 2022-06-05 DIAGNOSIS — R160 Hepatomegaly, not elsewhere classified: Secondary | ICD-10-CM

## 2022-06-05 DIAGNOSIS — F17210 Nicotine dependence, cigarettes, uncomplicated: Secondary | ICD-10-CM

## 2022-06-05 DIAGNOSIS — I151 Hypertension secondary to other renal disorders: Secondary | ICD-10-CM

## 2022-06-05 DIAGNOSIS — I77819 Aortic ectasia, unspecified site: Secondary | ICD-10-CM

## 2022-06-05 DIAGNOSIS — I4891 Unspecified atrial fibrillation: Secondary | ICD-10-CM

## 2022-06-05 MED ORDER — AMLODIPINE BESYLATE 10 MG PO TABS: 10.0000 mg | ORAL_TABLET | Freq: Every day | ORAL | 3 refills | Status: AC

## 2022-06-05 MED ORDER — LOSARTAN POTASSIUM 50 MG PO TABS: 50.0000 mg | ORAL_TABLET | Freq: Every day | ORAL | 3 refills | Status: DC

## 2022-06-05 NOTE — Patient Instructions (Signed)
It was a pleasure meeting you in clinic today.  High blood pressure Increase losartan to 100 mg daily Increase amlodipine to 10 mg daily Please keep a log of you blood pressure at home Follow up in 2 weeks  We will check lab work today

## 2022-06-06 LAB — BMP8+ANION GAP
Anion Gap: 14 mmol/L (ref 10.0–18.0)
BUN/Creatinine Ratio: 10 (ref 9–20)
BUN: 17 mg/dL (ref 6–24)
CO2: 25 mmol/L (ref 20–29)
Calcium: 9.2 mg/dL (ref 8.7–10.2)
Chloride: 106 mmol/L (ref 96–106)
Creatinine, Ser: 1.7 mg/dL — ABNORMAL HIGH (ref 0.76–1.27)
Glucose: 74 mg/dL (ref 70–99)
Potassium: 4.3 mmol/L (ref 3.5–5.2)
Sodium: 145 mmol/L — ABNORMAL HIGH (ref 134–144)
eGFR: 49 mL/min/{1.73_m2} — ABNORMAL LOW (ref >59)

## 2022-06-10 DIAGNOSIS — R160 Hepatomegaly, not elsewhere classified: Secondary | ICD-10-CM | POA: Insufficient documentation

## 2022-06-10 DIAGNOSIS — Z5989 Other problems related to housing and economic circumstances: Secondary | ICD-10-CM | POA: Insufficient documentation

## 2022-06-10 DIAGNOSIS — I77810 Thoracic aortic ectasia: Secondary | ICD-10-CM | POA: Insufficient documentation

## 2022-06-10 MED ORDER — LOSARTAN POTASSIUM 50 MG PO TABS: 50.0000 mg | ORAL_TABLET | Freq: Every day | ORAL | 3 refills | Status: DC

## 2022-06-10 NOTE — Assessment & Plan Note (Signed)
Likely related to left and right atrial thrombus and multiple PEs.  Not currently on anticoagulation.  Appears to have regular rate and rhythm in office visit today.

## 2022-06-10 NOTE — Assessment & Plan Note (Signed)
Patient noted to have a mildly dilated ascending aorta measuring 38 mm found incidentally on echocardiogram will need repeat as well once he has insurance for orange card.

## 2022-06-10 NOTE — Assessment & Plan Note (Signed)
Patient previously followed with Dr. Moshe Cipro but has not since he lost insurance.  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  He thinks he will likely get insurance through his new job.  Will need follow-up with nephrology once he obtains insurance insurance.

## 2022-06-10 NOTE — Assessment & Plan Note (Signed)
BP elevated today at 140/100 and on repeat 159/101.  He has been taking amlodipine 5 mg daily and losartan 50 mg daily.  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  History of PCKD may also be contributing to this.   - increase losartan to 100 mg daily and amlodipine to 10 mg dialy -BMP today with elevated sodium of 145, Cr. of 1.7 seems c/w baseline around 1.6-1.7 and CKD IIIa. elevated Na likely dehydration and patient was thirsty and requesting water during visit. Repeat BMP at next visit  - BP log  - follow up in 2 weeks

## 2022-06-10 NOTE — Assessment & Plan Note (Signed)
Incidental left hepatic mass measuring 3.4 cm noted in CT chest in November 2019.  Recommended MRI follow-up which does not appear to have been done.  Patient currently uninsured.  We discussed orange card versus insurance.  Patient has cough that he will have insurance through his job would like to hold off on orange card paperwork for now.  We will follow-up with him regarding this and obtain repeat scan when able to.

## 2022-06-10 NOTE — Progress Notes (Signed)
Internal Medicine Clinic Attending  Case discussed with Dr. Lisabeth Devoid  At the time of the visit.  We reviewed the resident's history and exam and pertinent patient test results.  I agree with the assessment, diagnosis, and plan of care documented in the resident's note. Complicated history of syncopal episode likely 2/2 PE. VQ scan with high suspicion for PE as well as right atrial thrombus straddling a PFO and requiring emergent/urgent surgery. Evidence of RV overload on TTE at that time. It was unclear during today's visit how long [name] was taking his [facility], however he is now off. Following the visit, we reviewed provider notes and it appears cardiothoracic surgery, Dr. Prescott Gum, recommended one year of [facility] while general cardiology felt longer-term anticoagulation may be necessary given unprovoked nature of VTE. We will need to continue to address this at 2 week f/u.

## 2022-06-10 NOTE — Assessment & Plan Note (Signed)
Patient with history of PE in November 2019 following a syncopal episode.  He was noted to be in A-fib . VQ scan consistent with multiple PEs.  TEE with large thrombus straddling PFO requiring emergent sternotomy with cardiopulmonary bypass and left and right atriotomy with removal of the thrombus.  He was later also found to have DVT along the ICU.  Following this he was started on Xarelto.  PEs were thought to be unprovoked per cardiology notes.   However he does not recall being on anticoagulation is not currently on Xarelto. Would likely benefit from lifelong anticoagulation.   We will need to discuss this further with him at his next visit.
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
## 2022-06-10 NOTE — Assessment & Plan Note (Signed)
Patient is currently uninsured.  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   Discussed the importance of getting follow up imaging and nephrology follow up which we cannot do while he is uninsured. He is understanding.  He would like to avoid applying for a orange card as he does not want government assistance.  Will follow up with him regarding his insurance status at next visit.

## 2022-06-10 NOTE — Assessment & Plan Note (Deleted)
BP elevated today at 140/100 and on repeat 159/101.  He has been taking amlodipine 5 mg daily and losartan 50 mg daily.  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

## 2022-06-11 ENCOUNTER — Telehealth: Payer: Self-pay

## 2022-06-11 MED ORDER — LOSARTAN POTASSIUM 100 MG PO TABS: 100.0000 mg | ORAL_TABLET | Freq: Every day | ORAL | 3 refills | Status: AC

## 2022-06-11 MED ORDER — ALBUTEROL SULFATE HFA 108 (90 BASE) MCG/ACT IN AERS: 1.0000 | INHALATION_SPRAY | Freq: Four times a day (QID) | RESPIRATORY_TRACT | 1 refills | Status: AC | PRN

## 2022-06-11 NOTE — Telephone Encounter (Signed)
Requesting new Rx for losartan (COZAAR) 100mg  to be sent to [place], [place]. Please call pt back.

## 2022-06-11 NOTE — Telephone Encounter (Signed)
Pt was seen on 8/18623 and instructed to increase Losartan to 100 mg daily - need new rx sent to [facility] in [place]. Also he needs  a refill on Albuterol inhaler. Thanks

## 2022-06-11 NOTE — Addendum Note (Signed)
Addended by: Iona Beard on: 06/11/2022 10:58 AM   Modules accepted: Orders

## 2023-02-02 ENCOUNTER — Emergency Department (HOSPITAL_COMMUNITY): Payer: 59

## 2023-02-02 ENCOUNTER — Emergency Department (HOSPITAL_COMMUNITY)
Admission: EM | Admit: 2023-02-02 | Discharge: 2023-02-02 | Disposition: A | Discharge status: Other Acute Inpatient Hospital | Payer: 59 | Attending: Emergency Medicine | Admitting: Emergency Medicine

## 2023-02-02 ENCOUNTER — Encounter (HOSPITAL_COMMUNITY): Payer: Self-pay

## 2023-02-02 DIAGNOSIS — L2089 Other atopic dermatitis: Secondary | ICD-10-CM | POA: Diagnosis not present

## 2023-02-02 DIAGNOSIS — R680 Hypothermia, not associated with low environmental temperature: Secondary | ICD-10-CM | POA: Diagnosis not present

## 2023-02-02 DIAGNOSIS — E538 Deficiency of other specified B group vitamins: Secondary | ICD-10-CM | POA: Diagnosis not present

## 2023-02-02 DIAGNOSIS — R109 Unspecified abdominal pain: Secondary | ICD-10-CM | POA: Diagnosis not present

## 2023-02-02 DIAGNOSIS — I519 Heart disease, unspecified: Secondary | ICD-10-CM | POA: Diagnosis not present

## 2023-02-02 DIAGNOSIS — L511 Stevens-Johnson syndrome: Secondary | ICD-10-CM | POA: Insufficient documentation

## 2023-02-02 DIAGNOSIS — L408 Other psoriasis: Secondary | ICD-10-CM | POA: Diagnosis not present

## 2023-02-02 DIAGNOSIS — R062 Wheezing: Secondary | ICD-10-CM | POA: Diagnosis not present

## 2023-02-02 DIAGNOSIS — X58XXXA Exposure to other specified factors, initial encounter: Secondary | ICD-10-CM | POA: Diagnosis not present

## 2023-02-02 DIAGNOSIS — N281 Cyst of kidney, acquired: Secondary | ICD-10-CM | POA: Diagnosis not present

## 2023-02-02 DIAGNOSIS — B951 Streptococcus, group B, as the cause of diseases classified elsewhere: Secondary | ICD-10-CM | POA: Diagnosis not present

## 2023-02-02 DIAGNOSIS — I5189 Other ill-defined heart diseases: Secondary | ICD-10-CM | POA: Diagnosis not present

## 2023-02-02 DIAGNOSIS — Z8774 Personal history of (corrected) congenital malformations of heart and circulatory system: Secondary | ICD-10-CM | POA: Diagnosis not present

## 2023-02-02 DIAGNOSIS — N179 Acute kidney failure, unspecified: Secondary | ICD-10-CM | POA: Diagnosis not present

## 2023-02-02 DIAGNOSIS — A491 Streptococcal infection, unspecified site: Secondary | ICD-10-CM | POA: Diagnosis not present

## 2023-02-02 DIAGNOSIS — T68XXXA Hypothermia, initial encounter: Secondary | ICD-10-CM | POA: Diagnosis not present

## 2023-02-02 DIAGNOSIS — B9562 Methicillin resistant Staphylococcus aureus infection as the cause of diseases classified elsewhere: Secondary | ICD-10-CM | POA: Diagnosis not present

## 2023-02-02 DIAGNOSIS — L308 Other specified dermatitis: Secondary | ICD-10-CM | POA: Diagnosis not present

## 2023-02-02 DIAGNOSIS — I517 Cardiomegaly: Secondary | ICD-10-CM | POA: Diagnosis not present

## 2023-02-02 DIAGNOSIS — R7881 Bacteremia: Secondary | ICD-10-CM | POA: Diagnosis not present

## 2023-02-02 DIAGNOSIS — B000 Eczema herpeticum: Secondary | ICD-10-CM | POA: Diagnosis not present

## 2023-02-02 DIAGNOSIS — I4891 Unspecified atrial fibrillation: Secondary | ICD-10-CM | POA: Diagnosis not present

## 2023-02-02 DIAGNOSIS — E639 Nutritional deficiency, unspecified: Secondary | ICD-10-CM | POA: Diagnosis not present

## 2023-02-02 DIAGNOSIS — I513 Intracardiac thrombosis, not elsewhere classified: Secondary | ICD-10-CM | POA: Diagnosis not present

## 2023-02-02 DIAGNOSIS — Z86718 Personal history of other venous thrombosis and embolism: Secondary | ICD-10-CM | POA: Diagnosis not present

## 2023-02-02 DIAGNOSIS — R93422 Abnormal radiologic findings on diagnostic imaging of left kidney: Secondary | ICD-10-CM | POA: Diagnosis not present

## 2023-02-02 DIAGNOSIS — L539 Erythematous condition, unspecified: Secondary | ICD-10-CM | POA: Diagnosis not present

## 2023-02-02 DIAGNOSIS — Z01818 Encounter for other preprocedural examination: Secondary | ICD-10-CM | POA: Diagnosis not present

## 2023-02-02 DIAGNOSIS — N433 Hydrocele, unspecified: Secondary | ICD-10-CM | POA: Diagnosis not present

## 2023-02-02 DIAGNOSIS — N184 Chronic kidney disease, stage 4 (severe): Secondary | ICD-10-CM | POA: Diagnosis not present

## 2023-02-02 DIAGNOSIS — J45901 Unspecified asthma with (acute) exacerbation: Secondary | ICD-10-CM | POA: Diagnosis not present

## 2023-02-02 DIAGNOSIS — R21 Rash and other nonspecific skin eruption: Secondary | ICD-10-CM | POA: Diagnosis not present

## 2023-02-02 DIAGNOSIS — A4901 Methicillin susceptible Staphylococcus aureus infection, unspecified site: Secondary | ICD-10-CM | POA: Diagnosis not present

## 2023-02-02 DIAGNOSIS — R059 Cough, unspecified: Secondary | ICD-10-CM | POA: Diagnosis not present

## 2023-02-02 DIAGNOSIS — D539 Nutritional anemia, unspecified: Secondary | ICD-10-CM | POA: Diagnosis not present

## 2023-02-02 DIAGNOSIS — N5089 Other specified disorders of the male genital organs: Secondary | ICD-10-CM | POA: Diagnosis not present

## 2023-02-02 DIAGNOSIS — I129 Hypertensive chronic kidney disease with stage 1 through stage 4 chronic kidney disease, or unspecified chronic kidney disease: Secondary | ICD-10-CM | POA: Diagnosis not present

## 2023-02-02 DIAGNOSIS — L209 Atopic dermatitis, unspecified: Secondary | ICD-10-CM | POA: Diagnosis not present

## 2023-02-02 LAB — COMPREHENSIVE METABOLIC PANEL
ALT: 18 U/L (ref 0–44)
AST: 15 U/L (ref 15–41)
Albumin: 2.2 g/dL — ABNORMAL LOW (ref 3.5–5.0)
Alkaline Phosphatase: 161 U/L — ABNORMAL HIGH (ref 38–126)
Anion gap: 12 (ref 5–15)
BUN: 38 mg/dL — ABNORMAL HIGH (ref 6–20)
CO2: 19 mmol/L — ABNORMAL LOW (ref 22–32)
Calcium: 7.5 mg/dL — ABNORMAL LOW (ref 8.9–10.3)
Chloride: 105 mmol/L (ref 98–111)
Creatinine, Ser: 4.04 mg/dL — ABNORMAL HIGH (ref 0.61–1.24)
GFR, Estimated: 17 mL/min — ABNORMAL LOW (ref >60)
Glucose, Bld: 130 mg/dL — ABNORMAL HIGH (ref 70–99)
Potassium: 3.8 mmol/L (ref 3.5–5.1)
Sodium: 136 mmol/L (ref 135–145)
Total Bilirubin: 0.6 mg/dL (ref 0.3–1.2)
Total Protein: 5.9 g/dL — ABNORMAL LOW (ref 6.5–8.1)

## 2023-02-02 LAB — CBC WITH DIFFERENTIAL/PLATELET
Abs Immature Granulocytes: 0.03 10*3/uL (ref 0.00–0.07)
Basophils Absolute: 0 10*3/uL (ref 0.0–0.1)
Basophils Relative: 0 %
Eosinophils Absolute: 0.1 10*3/uL (ref 0.0–0.5)
Eosinophils Relative: 1 %
HCT: 34.7 % — ABNORMAL LOW (ref 39.0–52.0)
Hemoglobin: 11.7 g/dL — ABNORMAL LOW (ref 13.0–17.0)
Immature Granulocytes: 0 %
Lymphocytes Relative: 36 %
Lymphs Abs: 2.9 10*3/uL (ref 0.7–4.0)
MCH: 34.5 pg — ABNORMAL HIGH (ref 26.0–34.0)
MCHC: 33.7 g/dL (ref 30.0–36.0)
MCV: 102.4 fL — ABNORMAL HIGH (ref 80.0–100.0)
Monocytes Absolute: 0.2 10*3/uL (ref 0.1–1.0)
Monocytes Relative: 3 %
Neutro Abs: 4.9 10*3/uL (ref 1.7–7.7)
Neutrophils Relative %: 60 %
Platelets: 202 10*3/uL (ref 150–400)
RBC: 3.39 MIL/uL — ABNORMAL LOW (ref 4.22–5.81)
RDW: 15.9 % — ABNORMAL HIGH (ref 11.5–15.5)
WBC: 8.2 10*3/uL (ref 4.0–10.5)
nRBC: 0 % (ref 0.0–0.2)

## 2023-02-02 MED ORDER — DIPHENHYDRAMINE HCL 50 MG/ML IJ SOLN
25.0000 mg | Freq: Once | INTRAMUSCULAR | Status: AC
  Administered 2023-02-02: 25 mg via INTRAVENOUS
  Filled 2023-02-02: qty 1

## 2023-02-02 MED ORDER — MORPHINE SULFATE (PF) 4 MG/ML IV SOLN
4.0000 mg | Freq: Once | INTRAVENOUS | Status: AC
  Administered 2023-02-02: 4 mg via INTRAVENOUS
  Filled 2023-02-02: qty 1

## 2023-02-02 MED ORDER — LACTATED RINGERS IV BOLUS
1000.0000 mL | Freq: Once | INTRAVENOUS | Status: AC
  Administered 2023-02-02: 1000 mL via INTRAVENOUS

## 2023-02-02 MED ORDER — SODIUM CHLORIDE 0.9 % IV BOLUS: 1000.0000 mL | Freq: Once | INTRAVENOUS | Status: DC

## 2023-02-02 NOTE — ED Triage Notes (Signed)
Pt arrives today c/o pain all over his body, along with fatigue. Pt has rash all over body. Pt has been wearing gloves on bilateral hands, cracking and white.

## 2023-02-02 NOTE — ED Notes (Signed)
[facility] called for transport. 

## 2023-02-02 NOTE — ED Notes (Signed)
PALS  line called ? ? ?
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
## 2023-02-02 NOTE — ED Notes (Signed)
Patient transported to CT 

## 2023-02-02 NOTE — ED Notes (Signed)
Pt states he will attempt to give urine specimen in about 10 minutes

## 2023-02-02 NOTE — ED Notes (Signed)
Unsuccessful IV attempt x2.  

## 2023-02-02 NOTE — ED Notes (Signed)
Will perform US PIV shortly. 

## 2023-02-11 DIAGNOSIS — L539 Erythematous condition, unspecified: Secondary | ICD-10-CM | POA: Diagnosis not present

## 2023-02-20 DIAGNOSIS — B000 Eczema herpeticum: Secondary | ICD-10-CM | POA: Diagnosis not present

## 2023-02-20 DIAGNOSIS — L539 Erythematous condition, unspecified: Secondary | ICD-10-CM | POA: Diagnosis not present

## 2023-02-20 DIAGNOSIS — L2084 Intrinsic (allergic) eczema: Secondary | ICD-10-CM | POA: Diagnosis not present

## 2023-03-20 ENCOUNTER — Ambulatory Visit: Payer: Self-pay | Admitting: Internal Medicine
